# Patient Record
Sex: Male | Born: 1950 | Race: White | Hispanic: No | Marital: Married | Smoking: Current some day smoker
Health system: Southern US, Community
[De-identification: ages and names within clinical notes are randomized; demographics above are authoritative.]

## PROBLEM LIST (undated history)

## (undated) DIAGNOSIS — R42 Dizziness and giddiness: Secondary | ICD-10-CM

## (undated) DIAGNOSIS — Z87442 Personal history of urinary calculi: Secondary | ICD-10-CM

## (undated) DIAGNOSIS — I771 Stricture of artery: Secondary | ICD-10-CM

## (undated) DIAGNOSIS — K3189 Other diseases of stomach and duodenum: Secondary | ICD-10-CM

## (undated) DIAGNOSIS — K219 Gastro-esophageal reflux disease without esophagitis: Secondary | ICD-10-CM

## (undated) DIAGNOSIS — Z974 Presence of external hearing-aid: Secondary | ICD-10-CM

## (undated) DIAGNOSIS — G4730 Sleep apnea, unspecified: Secondary | ICD-10-CM

## (undated) DIAGNOSIS — I10 Essential (primary) hypertension: Secondary | ICD-10-CM

## (undated) DIAGNOSIS — I219 Acute myocardial infarction, unspecified: Secondary | ICD-10-CM

## (undated) DIAGNOSIS — R935 Abnormal findings on diagnostic imaging of other abdominal regions, including retroperitoneum: Secondary | ICD-10-CM

## (undated) DIAGNOSIS — M1990 Unspecified osteoarthritis, unspecified site: Secondary | ICD-10-CM

## (undated) DIAGNOSIS — I2510 Atherosclerotic heart disease of native coronary artery without angina pectoris: Secondary | ICD-10-CM

## (undated) DIAGNOSIS — E7800 Pure hypercholesterolemia, unspecified: Secondary | ICD-10-CM

## (undated) DIAGNOSIS — N200 Calculus of kidney: Secondary | ICD-10-CM

## (undated) DIAGNOSIS — E119 Type 2 diabetes mellitus without complications: Secondary | ICD-10-CM

## (undated) DIAGNOSIS — Z87891 Personal history of nicotine dependence: Secondary | ICD-10-CM

## (undated) DIAGNOSIS — D649 Anemia, unspecified: Secondary | ICD-10-CM

## (undated) DIAGNOSIS — C801 Malignant (primary) neoplasm, unspecified: Secondary | ICD-10-CM

## (undated) DIAGNOSIS — J189 Pneumonia, unspecified organism: Secondary | ICD-10-CM

## (undated) DIAGNOSIS — Z972 Presence of dental prosthetic device (complete) (partial): Secondary | ICD-10-CM

## (undated) HISTORY — DX: Personal history of nicotine dependence: Z87.891

## (undated) HISTORY — PX: KNEE SURGERY: SHX244

## (undated) HISTORY — PX: PARTIAL GASTRECTOMY: SHX2172

## (undated) HISTORY — DX: Calculus of kidney: N20.0

## (undated) HISTORY — DX: Malignant (primary) neoplasm, unspecified: C80.1

## (undated) HISTORY — PX: CARPAL TUNNEL RELEASE: SHX101

## (undated) HISTORY — PX: BACK SURGERY: SHX140

---
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
## 2002-07-31 DIAGNOSIS — I219 Acute myocardial infarction, unspecified: Secondary | ICD-10-CM

## 2002-07-31 HISTORY — PX: CARDIAC CATHETERIZATION: SHX172

## 2002-07-31 HISTORY — DX: Acute myocardial infarction, unspecified: I21.9

## 2005-11-06 ENCOUNTER — Emergency Department: Payer: Self-pay | Admitting: Emergency Medicine

## 2010-06-15 ENCOUNTER — Ambulatory Visit: Payer: Self-pay | Admitting: Family Medicine

## 2010-07-11 ENCOUNTER — Ambulatory Visit: Payer: Self-pay | Admitting: Family Medicine

## 2012-02-17 ENCOUNTER — Emergency Department: Payer: Self-pay | Admitting: Internal Medicine

## 2014-01-01 ENCOUNTER — Emergency Department: Payer: Self-pay | Admitting: Emergency Medicine

## 2014-01-01 LAB — PROTIME-INR
INR: 1
Prothrombin Time: 13.2 secs (ref 11.5–14.7)

## 2014-01-01 LAB — CBC
HCT: 43.9 % (ref 40.0–52.0)
HGB: 14.6 g/dL (ref 13.0–18.0)
MCH: 32 pg (ref 26.0–34.0)
MCHC: 33.2 g/dL (ref 32.0–36.0)
MCV: 96 fL (ref 80–100)
Platelet: 147 10*3/uL — ABNORMAL LOW (ref 150–440)
RBC: 4.55 10*6/uL (ref 4.40–5.90)
RDW: 13.2 % (ref 11.5–14.5)
WBC: 15 10*3/uL — ABNORMAL HIGH (ref 3.8–10.6)

## 2014-01-03 ENCOUNTER — Emergency Department: Payer: Self-pay | Admitting: Internal Medicine

## 2014-01-06 ENCOUNTER — Emergency Department: Payer: Self-pay | Admitting: Emergency Medicine

## 2014-03-04 DIAGNOSIS — I214 Non-ST elevation (NSTEMI) myocardial infarction: Secondary | ICD-10-CM | POA: Insufficient documentation

## 2014-03-04 HISTORY — DX: Non-ST elevation (NSTEMI) myocardial infarction: I21.4

## 2014-07-31 DIAGNOSIS — K3189 Other diseases of stomach and duodenum: Secondary | ICD-10-CM

## 2014-07-31 HISTORY — DX: Other diseases of stomach and duodenum: K31.89

## 2014-11-29 ENCOUNTER — Emergency Department
Admission: EM | Admit: 2014-11-29 | Discharge: 2014-11-30 | Disposition: A | Discharge status: Home / Self Care | Payer: Commercial Managed Care - PPO | Attending: Emergency Medicine | Admitting: Emergency Medicine

## 2014-11-29 ENCOUNTER — Emergency Department: Payer: Commercial Managed Care - PPO

## 2014-11-29 ENCOUNTER — Other Ambulatory Visit: Payer: Self-pay

## 2014-11-29 ENCOUNTER — Encounter: Payer: Self-pay | Admitting: Emergency Medicine

## 2014-11-29 DIAGNOSIS — Y998 Other external cause status: Secondary | ICD-10-CM | POA: Insufficient documentation

## 2014-11-29 DIAGNOSIS — Z720 Tobacco use: Secondary | ICD-10-CM | POA: Diagnosis not present

## 2014-11-29 DIAGNOSIS — Z8701 Personal history of pneumonia (recurrent): Secondary | ICD-10-CM | POA: Diagnosis not present

## 2014-11-29 DIAGNOSIS — E119 Type 2 diabetes mellitus without complications: Secondary | ICD-10-CM | POA: Insufficient documentation

## 2014-11-29 DIAGNOSIS — Z7982 Long term (current) use of aspirin: Secondary | ICD-10-CM | POA: Insufficient documentation

## 2014-11-29 DIAGNOSIS — Z79899 Other long term (current) drug therapy: Secondary | ICD-10-CM | POA: Insufficient documentation

## 2014-11-29 DIAGNOSIS — Y9289 Other specified places as the place of occurrence of the external cause: Secondary | ICD-10-CM | POA: Insufficient documentation

## 2014-11-29 DIAGNOSIS — S76219A Strain of adductor muscle, fascia and tendon of unspecified thigh, initial encounter: Secondary | ICD-10-CM | POA: Insufficient documentation

## 2014-11-29 DIAGNOSIS — S3991XA Unspecified injury of abdomen, initial encounter: Secondary | ICD-10-CM | POA: Diagnosis present

## 2014-11-29 DIAGNOSIS — Y9389 Activity, other specified: Secondary | ICD-10-CM | POA: Insufficient documentation

## 2014-11-29 DIAGNOSIS — X58XXXA Exposure to other specified factors, initial encounter: Secondary | ICD-10-CM | POA: Diagnosis not present

## 2014-11-29 DIAGNOSIS — R0600 Dyspnea, unspecified: Secondary | ICD-10-CM | POA: Diagnosis not present

## 2014-11-29 DIAGNOSIS — S39011A Strain of muscle, fascia and tendon of abdomen, initial encounter: Secondary | ICD-10-CM

## 2014-11-29 DIAGNOSIS — Z7902 Long term (current) use of antithrombotics/antiplatelets: Secondary | ICD-10-CM | POA: Diagnosis not present

## 2014-11-29 HISTORY — DX: Pneumonia, unspecified organism: J18.9

## 2014-11-29 HISTORY — DX: Type 2 diabetes mellitus without complications: E11.9

## 2014-11-29 LAB — COMPREHENSIVE METABOLIC PANEL
ALT: 48 U/L (ref 17–63)
AST: 44 U/L — ABNORMAL HIGH (ref 15–41)
Albumin: 4 g/dL (ref 3.5–5.0)
Alkaline Phosphatase: 61 U/L (ref 38–126)
Anion gap: 9 (ref 5–15)
BUN: 20 mg/dL (ref 6–20)
CO2: 27 mmol/L (ref 22–32)
Calcium: 9.6 mg/dL (ref 8.9–10.3)
Chloride: 105 mmol/L (ref 101–111)
Creatinine, Ser: 1.02 mg/dL (ref 0.61–1.24)
GFR calc Af Amer: >60 mL/min (ref >60)
GFR calc non Af Amer: >60 mL/min (ref >60)
Glucose, Bld: 142 mg/dL — ABNORMAL HIGH (ref 65–99)
Potassium: 3.9 mmol/L (ref 3.5–5.1)
Sodium: 141 mmol/L (ref 135–145)
Total Bilirubin: 0.6 mg/dL (ref 0.3–1.2)
Total Protein: 7.6 g/dL (ref 6.5–8.1)

## 2014-11-29 LAB — URINALYSIS COMPLETE WITH MICROSCOPIC (ARMC ONLY)
Bilirubin Urine: NEGATIVE
Glucose, UA: NEGATIVE mg/dL
Hgb urine dipstick: NEGATIVE
Ketones, ur: NEGATIVE mg/dL
Leukocytes, UA: NEGATIVE
Nitrite: NEGATIVE
Protein, ur: NEGATIVE mg/dL
Specific Gravity, Urine: 1.023 (ref 1.005–1.030)
pH: 7 (ref 5.0–8.0)

## 2014-11-29 LAB — CBC WITH DIFFERENTIAL/PLATELET
Basophils Absolute: 0.1 10*3/uL (ref 0–0.1)
Basophils Relative: 1 %
Eosinophils Absolute: 0.4 10*3/uL (ref 0–0.7)
Eosinophils Relative: 4 %
HCT: 43.3 % (ref 40.0–52.0)
Hemoglobin: 14.7 g/dL (ref 13.0–18.0)
Lymphocytes Relative: 30 %
Lymphs Abs: 3 10*3/uL (ref 1.0–3.6)
MCH: 32.5 pg (ref 26.0–34.0)
MCHC: 33.9 g/dL (ref 32.0–36.0)
MCV: 95.9 fL (ref 80.0–100.0)
Monocytes Absolute: 0.7 10*3/uL (ref 0.2–1.0)
Monocytes Relative: 7 %
Neutro Abs: 6.1 10*3/uL (ref 1.4–6.5)
Neutrophils Relative %: 58 %
Platelets: 137 10*3/uL — ABNORMAL LOW (ref 150–440)
RBC: 4.52 MIL/uL (ref 4.40–5.90)
RDW: 13.8 % (ref 11.5–14.5)
WBC: 10.3 10*3/uL (ref 3.8–10.6)

## 2014-11-29 LAB — TROPONIN I
Troponin I: <0.03 ng/mL (ref <0.031)
Troponin I: <0.03 ng/mL (ref <0.031)

## 2014-11-29 LAB — LIPASE, BLOOD: Lipase: 46 U/L (ref 22–51)

## 2014-11-29 NOTE — ED Provider Notes (Signed)
[facility] Emergency Department Provider Note    ____________________________________________  Time seen: 9:45 PM  I have reviewed the triage vital signs and the nursing notes.   HISTORY  Chief Complaint Abdominal Pain       HPI Kevin Ryan is a 64 y.o. male history of hypertension and CAD who presents with several weeks of lower abdominal cramping. He says that it is associated with movement and heavy lifting. He became concerned today because while lifting he broke out in a sweat. He said he also had "indigestion last night."  His concern was that he was having a heart attack. He says he has had a stent in the past and is compliant with his aspirin and Plavix. He denies any chest pain at this time or with the event this afternoon. He is also concerned because he said that he feels like he is not having complete voids. However, he is not waking up in the middle the night to void and does not have any burning on urination. He does not any history of urinary tract infection. His lower abdominal pain is described as aching and is mild to moderate.He says he has not had a stress test in 2 years. However, he is compliant with his follow-up to Dr. Saralyn Pilar.  He does not have a primary care doctor and has not had prostate or colon cancer screening. He does a history of liver cancer in his family which killed his father.   Past Medical History  Diagnosis Date  . Diabetes mellitus without complication   . Myocardial infarct   . Pneumonia     There are no active problems to display for this patient.   History reviewed. No pertinent past surgical history.  Current Outpatient Rx  Name  Route  Sig  Dispense  Refill  . aspirin 81 MG tablet   Oral   Take 81 mg by mouth daily.         . Choline Fenofibrate (FENOFIBRIC ACID) 135 MG CPDR   Oral   Take 135 mg by mouth at bedtime.         . clopidogrel (PLAVIX) 75 MG tablet   Oral   Take 75 mg by mouth  daily.         Marland Kitchen glipiZIDE (GLUCOTROL XL) 5 MG 24 hr tablet   Oral   Take 5 mg by mouth daily with breakfast.         . lisinopril (PRINIVIL,ZESTRIL) 2.5 MG tablet   Oral   Take 2.5 mg by mouth daily.         . metFORMIN (GLUCOPHAGE) 500 MG tablet   Oral   Take 2,000 mg by mouth daily.         . metoprolol (LOPRESSOR) 50 MG tablet   Oral   Take 50 mg by mouth 2 (two) times daily.         . Omega-3 Fatty Acids (FISH OIL) 1000 MG CAPS   Oral   Take 1,000 mg by mouth daily.         . simvastatin (ZOCOR) 80 MG tablet   Oral   Take 80 mg by mouth daily.         . Vitamin D, Ergocalciferol, (DRISDOL) 50000 UNITS CAPS capsule   Oral   Take 50,000 Units by mouth every 7 (seven) days.           Allergies Tylenol  History reviewed. No pertinent family history.  Social History History  Substance Use Topics  . Smoking status: Current Every Day Smoker  . Smokeless tobacco: Never Used  . Alcohol Use: No    Review of Systems  Constitutional: Negative for fever. Eyes: Negative for visual changes. ENT: Negative for sore throat. Cardiovascular: Negative for chest pain. Respiratory: Negative for shortness of breath. Gastrointestinal: Lower abdominal pain. Genitourinary: Negative for dysuria. Musculoskeletal: Negative for back pain. Skin: Negative for rash. Neurological: Negative for headaches, focal weakness or numbness.   10-point ROS otherwise negative.  ____________________________________________   PHYSICAL EXAM:  VITAL SIGNS: ED Triage Vitals  Enc Vitals Group     BP 11/29/14 1811 140/64 mmHg     Pulse Rate 11/29/14 1811 73     Resp 11/29/14 1811 22     Temp 11/29/14 1811 98.5 F (36.9 C)     Temp Source 11/29/14 1811 Oral     SpO2 11/29/14 1811 97 %     Weight 11/29/14 1811 260 lb (117.935 kg)     Height 11/29/14 1811 6\' 2"  (1.88 m)     Head Cir --      Peak Flow --      Pain Score 11/29/14 1812 7     Pain Loc --      Pain Edu? --       Excl. in [place]? --      Constitutional: Alert and oriented. Well appearing and in no distress. Eyes: Conjunctivae are normal. PERRL. Normal extraocular movements. ENT   Head: Normocephalic and atraumatic.   Nose: No congestion/rhinnorhea.   Mouth/Throat: Mucous membranes are moist.   Neck: No stridor. Hematological/Lymphatic/Immunilogical: No cervical lymphadenopathy. Cardiovascular: Normal rate, regular rhythm. Normal and symmetric distal pulses are present in all extremities. No murmurs, rubs, or gallops. Respiratory: Normal respiratory effort without tachypnea nor retractions. Breath sounds are clear and equal bilaterally. No wheezes/rales/rhonchi. Gastrointestinal: Soft and nontender. No distention. No abdominal bruits. There is no CVA tenderness. Genitourinary: Uncircumcised. Normal inspection. No testicular pain.  Musculoskeletal: Nontender with normal range of motion in all extremities. No joint effusions.  No lower extremity tenderness nor edema. Mild tenderness to palpation over the pubic symphysis. There is no deformity and the pelvis is stable. Neurologic:  Normal speech and language. No gross focal neurologic deficits are appreciated. Speech is normal. No gait instability. Skin:  Skin is warm, dry and intact. No rash noted. Psychiatric: Mood and affect are normal. Speech and behavior are normal. Patient exhibits appropriate insight and judgment.  ____________________________________________   EKG   Date: 11/29/2014  Rate: 73  Rhythm: normal sinus rhythm  QRS Axis: normal  Intervals: normal  ST/T Wave abnormalities: normal  Conduction Disutrbances: none  Narrative Interpretation: Cannot rule out anterior infarct, age undetermined      ____________________________________________    RADIOLOGY  Deferred. No chest pain or upper abdominal pain.  ____________________________________________   PROCEDURES  Bedside bladder scan done after voiding  with 18 cc of urine.  ____________________________________________   INITIAL IMPRESSION / ASSESSMENT AND PLAN / ED COURSE  Pertinent labs & imaging results that were available during my care of the patient were reviewed by me and considered in my medical decision making (see chart for details).  ----------------------------------------- 12:08 AM on 11/30/2014 -----------------------------------------  Patient reassessed and is resting comfortably in bed. Patient is pain-free. 2 troponins both undetectable. Patient will be discharged home. Likely groin strain. Patient counseled to reduce heavy lifting. Also counseled to follow up with primary care doctor. Patient has become behind on his prostate and colon  cancer screening. It a brother who died of throat cancer in father who died of possibly liver cancer. Patient will follow up at [place] care reasons in the past. Of note does not note any recent unexpected weight loss.  ____________________________________________   FINAL CLINICAL IMPRESSION(S) / ED DIAGNOSES  Acute groin strain, initial visit   Doran Stabler, MD 11/30/14 0009

## 2014-11-29 NOTE — ED Notes (Signed)
Patient to ED with c/o abdominal pain x today reports pain is in lower abdomen and down into groin area. Patient's wife reports sweats and nausea as well.

## 2015-03-09 ENCOUNTER — Emergency Department: Payer: Commercial Managed Care - PPO

## 2015-03-09 ENCOUNTER — Emergency Department
Admission: EM | Admit: 2015-03-09 | Discharge: 2015-03-09 | Disposition: A | Discharge status: Home / Self Care | Payer: Commercial Managed Care - PPO | Attending: Emergency Medicine | Admitting: Emergency Medicine

## 2015-03-09 ENCOUNTER — Encounter: Payer: Self-pay | Admitting: Emergency Medicine

## 2015-03-09 DIAGNOSIS — E119 Type 2 diabetes mellitus without complications: Secondary | ICD-10-CM | POA: Insufficient documentation

## 2015-03-09 DIAGNOSIS — Z79899 Other long term (current) drug therapy: Secondary | ICD-10-CM | POA: Insufficient documentation

## 2015-03-09 DIAGNOSIS — Z7901 Long term (current) use of anticoagulants: Secondary | ICD-10-CM | POA: Insufficient documentation

## 2015-03-09 DIAGNOSIS — Z7982 Long term (current) use of aspirin: Secondary | ICD-10-CM | POA: Insufficient documentation

## 2015-03-09 DIAGNOSIS — M5116 Intervertebral disc disorders with radiculopathy, lumbar region: Secondary | ICD-10-CM | POA: Insufficient documentation

## 2015-03-09 DIAGNOSIS — R222 Localized swelling, mass and lump, trunk: Secondary | ICD-10-CM | POA: Insufficient documentation

## 2015-03-09 DIAGNOSIS — R109 Unspecified abdominal pain: Secondary | ICD-10-CM | POA: Diagnosis present

## 2015-03-09 DIAGNOSIS — Z720 Tobacco use: Secondary | ICD-10-CM | POA: Diagnosis not present

## 2015-03-09 DIAGNOSIS — K3189 Other diseases of stomach and duodenum: Secondary | ICD-10-CM

## 2015-03-09 DIAGNOSIS — M5136 Other intervertebral disc degeneration, lumbar region: Secondary | ICD-10-CM

## 2015-03-09 LAB — CBC WITH DIFFERENTIAL/PLATELET
Basophils Absolute: 0 10*3/uL (ref 0–0.1)
Basophils Relative: 0 %
Eosinophils Absolute: 0.2 10*3/uL (ref 0–0.7)
Eosinophils Relative: 1 %
HCT: 41.2 % (ref 40.0–52.0)
Hemoglobin: 13.8 g/dL (ref 13.0–18.0)
Lymphocytes Relative: 7 %
Lymphs Abs: 1 10*3/uL (ref 1.0–3.6)
MCH: 32.6 pg (ref 26.0–34.0)
MCHC: 33.5 g/dL (ref 32.0–36.0)
MCV: 97.4 fL (ref 80.0–100.0)
Monocytes Absolute: 0.7 10*3/uL (ref 0.2–1.0)
Monocytes Relative: 5 %
Neutro Abs: 13 10*3/uL — ABNORMAL HIGH (ref 1.4–6.5)
Neutrophils Relative %: 87 %
Platelets: 116 10*3/uL — ABNORMAL LOW (ref 150–440)
RBC: 4.23 MIL/uL — ABNORMAL LOW (ref 4.40–5.90)
RDW: 13.6 % (ref 11.5–14.5)
WBC: 14.9 10*3/uL — ABNORMAL HIGH (ref 3.8–10.6)

## 2015-03-09 LAB — COMPREHENSIVE METABOLIC PANEL
ALT: 55 U/L (ref 17–63)
AST: 57 U/L — ABNORMAL HIGH (ref 15–41)
Albumin: 4 g/dL (ref 3.5–5.0)
Alkaline Phosphatase: 62 U/L (ref 38–126)
Anion gap: 8 (ref 5–15)
BUN: 20 mg/dL (ref 6–20)
CO2: 27 mmol/L (ref 22–32)
Calcium: 9 mg/dL (ref 8.9–10.3)
Chloride: 103 mmol/L (ref 101–111)
Creatinine, Ser: 1.11 mg/dL (ref 0.61–1.24)
GFR calc Af Amer: >60 mL/min (ref >60)
GFR calc non Af Amer: >60 mL/min (ref >60)
Glucose, Bld: 193 mg/dL — ABNORMAL HIGH (ref 65–99)
Potassium: 4 mmol/L (ref 3.5–5.1)
Sodium: 138 mmol/L (ref 135–145)
Total Bilirubin: 0.7 mg/dL (ref 0.3–1.2)
Total Protein: 7 g/dL (ref 6.5–8.1)

## 2015-03-09 LAB — URINALYSIS COMPLETE WITH MICROSCOPIC (ARMC ONLY)
Bacteria, UA: NONE SEEN
Bilirubin Urine: NEGATIVE
Glucose, UA: 50 mg/dL — AB
Hgb urine dipstick: NEGATIVE
Ketones, ur: NEGATIVE mg/dL
Leukocytes, UA: NEGATIVE
Nitrite: NEGATIVE
Protein, ur: NEGATIVE mg/dL
Specific Gravity, Urine: 1.021 (ref 1.005–1.030)
pH: 5 (ref 5.0–8.0)

## 2015-03-09 MED ORDER — OXYCODONE HCL 5 MG PO TABS
5.0000 mg | ORAL_TABLET | Freq: Three times a day (TID) | ORAL | Status: AC | PRN
Start: 2015-03-09 — End: 2016-03-08

## 2015-03-09 MED ORDER — HYDROMORPHONE HCL 1 MG/ML IJ SOLN
1.0000 mg | Freq: Once | INTRAMUSCULAR | Status: AC
  Administered 2015-03-09: 1 mg via INTRAVENOUS
  Filled 2015-03-09: qty 1

## 2015-03-09 MED ORDER — SODIUM CHLORIDE 0.9 % IV BOLUS (SEPSIS)
1000.0000 mL | Freq: Once | INTRAVENOUS | Status: AC
  Administered 2015-03-09: 1000 mL via INTRAVENOUS

## 2015-03-09 MED ORDER — ONDANSETRON HCL 4 MG/2ML IJ SOLN
4.0000 mg | Freq: Once | INTRAMUSCULAR | Status: AC
  Administered 2015-03-09: 4 mg via INTRAVENOUS
  Filled 2015-03-09: qty 2

## 2015-03-09 NOTE — Discharge Instructions (Signed)
Call today to schedule an appointment to see the gastroenterologist. Follow up with orthopedics for back pain. Continue the Naprosyn.

## 2015-03-09 NOTE — ED Provider Notes (Signed)
[facility] Emergency Department Provider Note  ____________________________________________  Time seen: Approximately 9:13 AM  I have reviewed the triage vital signs and the nursing notes.   HISTORY  Chief Complaint Flank Pain   HPI RACE Kevin Ryan is a 64 y.o. male presents to the emergency department for acute onset of right flank pain that has worsened through the morning. He did do quite a bit of yard work yesterday and had to crank the tiller. Otherwise he denies possibility of injury. He has had sciatica in the past but this feels different. He has also had kidney stone in the past but this feels different as well.   Past Medical History  Diagnosis Date  . Diabetes mellitus without complication   . Myocardial infarct   . Pneumonia     There are no active problems to display for this patient.   Past Surgical History  Procedure Laterality Date  . Knee surgery      Current Outpatient Rx  Name  Route  Sig  Dispense  Refill  . aspirin 81 MG tablet   Oral   Take 81 mg by mouth daily.         . Choline Fenofibrate (FENOFIBRIC ACID) 135 MG CPDR   Oral   Take 135 mg by mouth at bedtime.         . clopidogrel (PLAVIX) 75 MG tablet   Oral   Take 75 mg by mouth daily.         Marland Kitchen glipiZIDE (GLUCOTROL XL) 5 MG 24 hr tablet   Oral   Take 5 mg by mouth daily with breakfast.         . lisinopril (PRINIVIL,ZESTRIL) 2.5 MG tablet   Oral   Take 2.5 mg by mouth daily.         . metFORMIN (GLUCOPHAGE) 500 MG tablet   Oral   Take 2,000 mg by mouth daily.         . metoprolol (LOPRESSOR) 50 MG tablet   Oral   Take 50 mg by mouth 2 (two) times daily.         . Omega-3 Fatty Acids (FISH OIL) 1000 MG CAPS   Oral   Take 1,000 mg by mouth daily.         Marland Kitchen oxyCODONE (ROXICODONE) 5 MG immediate release tablet   Oral   Take 1 tablet (5 mg total) by mouth every 8 (eight) hours as needed.   20 tablet   0   . simvastatin (ZOCOR) 80 MG  tablet   Oral   Take 80 mg by mouth daily.         . Vitamin D, Ergocalciferol, (DRISDOL) 50000 UNITS CAPS capsule   Oral   Take 50,000 Units by mouth every 7 (seven) days.           Allergies Ibuprofen and Tylenol  No family history on file.  Social History History  Substance Use Topics  . Smoking status: Current Every Day Smoker  . Smokeless tobacco: Never Used  . Alcohol Use: No    Review of Systems Constitutional: No recent illness. Eyes: No visual changes. ENT: No sore throat. Cardiovascular: Denies chest pain or palpitations. Respiratory: Denies shortness of breath. Gastrointestinal: No abdominal pain.  Genitourinary: Negative for dysuria. Musculoskeletal: Pain in right flank. Skin: Negative for rash. Neurological: Negative for headaches, focal weakness or numbness. 10-point ROS otherwise negative.  ____________________________________________   PHYSICAL EXAM:  VITAL SIGNS: ED Triage Vitals  Enc  Vitals Group     BP 03/09/15 0820 145/57 mmHg     Pulse Rate 03/09/15 0820 100     Resp --      Temp 03/09/15 0820 99.9 F (37.7 C)     Temp Source 03/09/15 0820 Oral     SpO2 03/09/15 0820 97 %     Weight 03/09/15 0820 250 lb (113.399 kg)     Height 03/09/15 0820 6\' 2"  (1.88 m)     Head Cir --      Peak Flow --      Pain Score 03/09/15 0832 8     Pain Loc --      Pain Edu? --      Excl. in [place]? --     Constitutional: Alert and oriented. Well appearing and in no acute distress. Eyes: Conjunctivae are normal. EOMI. Head: Atraumatic. Nose: No congestion/rhinnorhea. Neck: No stridor.  Respiratory: Normal respiratory effort.   Musculoskeletal: Tenderness in the right flank area Neurologic:  Normal speech and language. No gross focal neurologic deficits are appreciated. Speech is normal. No gait instability. Skin:  Skin is warm, dry and intact. Atraumatic. Psychiatric: Mood and affect are normal. Speech and behavior are  normal.  ____________________________________________   LABS (all labs ordered are listed, but only abnormal results are displayed)  Labs Reviewed  COMPREHENSIVE METABOLIC PANEL - Abnormal; Notable for the following:    Glucose, Bld 193 (*)    AST 57 (*)    All other components within normal limits  URINALYSIS COMPLETEWITH MICROSCOPIC ([facility] ONLY) - Abnormal; Notable for the following:    Color, Urine YELLOW (*)    APPearance CLEAR (*)    Glucose, UA 50 (*)    Squamous Epithelial / LPF 0-5 (*)    All other components within normal limits  CBC WITH DIFFERENTIAL/PLATELET - Abnormal; Notable for the following:    WBC 14.9 (*)    RBC 4.23 (*)    Platelets 116 (*)    Neutro Abs 13.0 (*)    All other components within normal limits   ____________________________________________  RADIOLOGY  No obstructing calculus, 2 cm circumscribed mass arising from the greater curvature of stomach possibly GI ST, fatty liver disease, advanced degenerative changes and lumbar spine with multilevel degenerative spinal stenosis.  Plain film of the lumbar spine shows degenerative disc disease. ____________________________________________   PROCEDURES  Procedure(s) performed: None   ____________________________________________   INITIAL IMPRESSION / ASSESSMENT AND PLAN / ED COURSE  Pertinent labs & imaging results that were available during my care of the patient were reviewed by me and considered in my medical decision making (see chart for details).  Patient and family were given the results of the CT scan. They're strongly encouraged to call today to schedule an appointment with the gastrointestinal doctor of their choice for further evaluation of the mass on the stomach. They were also advised to schedule an appointment with the spine specialist/orthopedic doctor to discuss further evaluation of the degenerative disc disease and spinal stenosis.  Pain was well controlled upon discharge. He  was advised to return to the emergency department for symptoms that change or worsen if he is unable schedule an appointment. ____________________________________________   FINAL CLINICAL IMPRESSION(S) / ED DIAGNOSES  Final diagnoses:  Acute right flank pain  Mass of stomach  Degenerative disc disease, lumbar       Victorino Dike, FNP 03/09/15 1452  Carrie Mew, MD 03/09/15 [phone]

## 2015-03-09 NOTE — ED Notes (Signed)
Pt to ed with c/o right flank pain that started this am. Worse with movement.  Pt denies difficulty with urination. Pt does report yesterday he was pulling heavily on a tiller.

## 2015-03-09 NOTE — ED Notes (Signed)
Back from ct scan  ..resting at present . Family remains at bedside

## 2015-03-09 NOTE — ED Notes (Signed)
States he developed right lower back/flank pain after pulling to start a tiller.. Pain increases with movement. Unable to bear full wt d/t wt

## 2015-03-25 ENCOUNTER — Encounter: Payer: Self-pay | Admitting: *Deleted

## 2015-03-26 ENCOUNTER — Encounter
Admission: RE | Disposition: A | Discharge status: Home / Self Care | Payer: Self-pay | Source: Ambulatory Visit | Attending: Gastroenterology

## 2015-03-26 ENCOUNTER — Ambulatory Visit: Payer: Commercial Managed Care - PPO | Admitting: Anesthesiology

## 2015-03-26 ENCOUNTER — Encounter: Payer: Self-pay | Admitting: *Deleted

## 2015-03-26 ENCOUNTER — Ambulatory Visit
Admission: RE | Admit: 2015-03-26 | Discharge: 2015-03-26 | Disposition: A | Discharge status: Home / Self Care | Payer: Commercial Managed Care - PPO | Source: Ambulatory Visit | Attending: Gastroenterology | Admitting: Gastroenterology

## 2015-03-26 DIAGNOSIS — D120 Benign neoplasm of cecum: Secondary | ICD-10-CM | POA: Diagnosis not present

## 2015-03-26 DIAGNOSIS — Z79899 Other long term (current) drug therapy: Secondary | ICD-10-CM | POA: Diagnosis not present

## 2015-03-26 DIAGNOSIS — E119 Type 2 diabetes mellitus without complications: Secondary | ICD-10-CM | POA: Diagnosis not present

## 2015-03-26 DIAGNOSIS — I10 Essential (primary) hypertension: Secondary | ICD-10-CM | POA: Diagnosis not present

## 2015-03-26 DIAGNOSIS — Z886 Allergy status to analgesic agent status: Secondary | ICD-10-CM | POA: Insufficient documentation

## 2015-03-26 DIAGNOSIS — E780 Pure hypercholesterolemia: Secondary | ICD-10-CM | POA: Diagnosis not present

## 2015-03-26 DIAGNOSIS — Z7982 Long term (current) use of aspirin: Secondary | ICD-10-CM | POA: Diagnosis not present

## 2015-03-26 DIAGNOSIS — K210 Gastro-esophageal reflux disease with esophagitis: Secondary | ICD-10-CM | POA: Insufficient documentation

## 2015-03-26 DIAGNOSIS — F17200 Nicotine dependence, unspecified, uncomplicated: Secondary | ICD-10-CM | POA: Insufficient documentation

## 2015-03-26 DIAGNOSIS — D123 Benign neoplasm of transverse colon: Secondary | ICD-10-CM | POA: Insufficient documentation

## 2015-03-26 DIAGNOSIS — Z1211 Encounter for screening for malignant neoplasm of colon: Secondary | ICD-10-CM | POA: Insufficient documentation

## 2015-03-26 DIAGNOSIS — I708 Atherosclerosis of other arteries: Secondary | ICD-10-CM | POA: Diagnosis not present

## 2015-03-26 DIAGNOSIS — K621 Rectal polyp: Secondary | ICD-10-CM | POA: Diagnosis not present

## 2015-03-26 DIAGNOSIS — I252 Old myocardial infarction: Secondary | ICD-10-CM | POA: Diagnosis not present

## 2015-03-26 HISTORY — DX: Other diseases of stomach and duodenum: K31.89

## 2015-03-26 HISTORY — DX: Essential (primary) hypertension: I10

## 2015-03-26 HISTORY — DX: Abnormal findings on diagnostic imaging of other abdominal regions, including retroperitoneum: R93.5

## 2015-03-26 HISTORY — PX: COLONOSCOPY WITH PROPOFOL: SHX5780

## 2015-03-26 HISTORY — DX: Stricture of artery: I77.1

## 2015-03-26 HISTORY — DX: Pure hypercholesterolemia, unspecified: E78.00

## 2015-03-26 LAB — GLUCOSE, CAPILLARY: Glucose-Capillary: 138 mg/dL — ABNORMAL HIGH (ref 65–99)

## 2015-03-26 SURGERY — COLONOSCOPY WITH PROPOFOL
Anesthesia: General

## 2015-03-26 MED ORDER — FENTANYL CITRATE (PF) 100 MCG/2ML IJ SOLN
INTRAMUSCULAR | Status: DC | PRN
  Administered 2015-03-26: 50 ug via INTRAVENOUS

## 2015-03-26 MED ORDER — PROPOFOL INFUSION 10 MG/ML OPTIME
INTRAVENOUS | Status: DC | PRN
Start: 2015-03-26 — End: 2015-03-26
  Administered 2015-03-26: 140 ug/kg/min via INTRAVENOUS

## 2015-03-26 MED ORDER — SODIUM CHLORIDE 0.9 % IV SOLN
INTRAVENOUS | Status: DC
  Administered 2015-03-26: 10:00:00 via INTRAVENOUS

## 2015-03-26 MED ORDER — LIDOCAINE HCL (CARDIAC) 20 MG/ML IV SOLN
INTRAVENOUS | Status: DC | PRN
  Administered 2015-03-26: 40 mg via INTRAVENOUS

## 2015-03-26 MED ORDER — PROPOFOL 10 MG/ML IV BOLUS
INTRAVENOUS | Status: DC | PRN
  Administered 2015-03-26: 50 mg via INTRAVENOUS

## 2015-03-26 MED ORDER — GLYCOPYRROLATE 0.2 MG/ML IJ SOLN
INTRAMUSCULAR | Status: DC | PRN
  Administered 2015-03-26: 0.2 mg via INTRAVENOUS

## 2015-03-26 MED ORDER — METOPROLOL TARTRATE 50 MG PO TABS
ORAL_TABLET | ORAL | Status: AC
  Administered 2015-03-26: 50 mg via ORAL
  Filled 2015-03-26: qty 1

## 2015-03-26 MED ORDER — METOPROLOL TARTRATE 50 MG PO TABS
50.0000 mg | ORAL_TABLET | Freq: Once | ORAL | Status: AC
  Administered 2015-03-26: 50 mg via ORAL

## 2015-03-26 NOTE — Discharge Instructions (Signed)

## 2015-03-26 NOTE — Anesthesia Preprocedure Evaluation (Signed)
Anesthesia Evaluation  Patient identified by MRN, date of birth, ID band Patient awake    Reviewed: Allergy & Precautions, NPO status , Patient's Chart, lab work & pertinent test results  History of Anesthesia Complications Negative for: history of anesthetic complications  Airway Mallampati: II  TM Distance: >3 FB     Dental  (+) Partial Upper   Pulmonary Current Smoker (1 ppd),          Cardiovascular hypertension, Pt. on medications and Pt. on home beta blockers + Past MI and + Cardiac Stents     Neuro/Psych    GI/Hepatic   Endo/Other  diabetes, Type 2, Oral Hypoglycemic Agents  Renal/GU      Musculoskeletal   Abdominal   Peds  Hematology   Anesthesia Other Findings   Reproductive/Obstetrics                             Anesthesia Physical Anesthesia Plan  ASA: III  Anesthesia Plan: General   Post-op Pain Management:    Induction: Intravenous  Airway Management Planned: Nasal Cannula  Additional Equipment:   Intra-op Plan:   Post-operative Plan:   Informed Consent: I have reviewed the patients History and Physical, chart, labs and discussed the procedure including the risks, benefits and alternatives for the proposed anesthesia with the patient or authorized representative who has indicated his/her understanding and acceptance.     Plan Discussed with:   Anesthesia Plan Comments:         Anesthesia Quick Evaluation

## 2015-03-26 NOTE — Op Note (Signed)
[facility] Gastroenterology Patient Name: Kevin Ryan Procedure Date: 03/26/2015 9:36 AM MRN: [id number] Account #: [id number] Date of Birth: 08-30-1950 Admit Type: Outpatient Age: 64 Room: [facility] ENDO ROOM 2 Gender: Male Note Status: Finalized Procedure:         Upper GI endoscopy Indications:       Abnormal CT of the GI tract Providers:         Gerrit Heck. Rayann Heman, MD Referring MD:      Daniel Nones, MD (Referring MD) Medicines:         Propofol per Anesthesia Complications:     No immediate complications. Procedure:         Pre-Anesthesia Assessment:                    - Prior to the procedure, a History and Physical was                     performed, and patient medications, allergies and                     sensitivities were reviewed. The patient's tolerance of                     previous anesthesia was reviewed.                    After obtaining informed consent, the endoscope was passed                     under direct vision. Throughout the procedure, the                     patient's blood pressure, pulse, and oxygen saturations                     were monitored continuously. The Endoscope was introduced                     through the mouth, and advanced to the second part of                     duodenum. The upper GI endoscopy was accomplished without                     difficulty. The patient tolerated the procedure well. Findings:      LA Grade B (one or more mucosal breaks greater than 5 mm, not extending       between the tops of two mucosal folds) esophagitis was found at the       gastroesophageal junction. Biopsies were taken with a cold forceps for       histology.      Extrinsic compression on the stomach was found on the greater curvature       of the stomach.      Localized nodular mucosa was found in the duodenal bulb. Biopsies were       taken with a cold forceps for histology.      The exam was otherwise without  abnormality. Impression:        - LA Grade B reflux esophagitis. Biopsied.                    - Possible very mild extrinsic compression in the greater  curvature of the stomach.                    - Nodular mucosa in the duodenal bulb. Biopsied.                    - The examination was otherwise normal. Recommendation:    - Observe patient in GI recovery unit.                    - Resume regular diet.                    - Continue present medications.                    - Await pathology results.                    - Perform an upper endoscopic ultrasound (UEUS) at                     appointment to be scheduled.                    - The findings and recommendations were discussed with the                     patient.                    - The findings and recommendations were discussed with the                     patient's family.                    - Perform a colonoscopy today. Procedure Code(s): --- Professional ---                    [phone], Esophagogastroduodenoscopy, flexible, transoral;                     with biopsy, single or multiple CPT copyright 2014 American Medical Association. All rights reserved. The codes documented in this report are preliminary and upon coder review may  be revised to meet current compliance requirements. Mellody Life, MD 03/26/2015 10:00:54 AM This report has been signed electronically. Number of Addenda: 0 Note Initiated On: 03/26/2015 9:36 AM      [facility]

## 2015-03-26 NOTE — Anesthesia Procedure Notes (Signed)
Date/Time: 03/26/2015 9:34 AM Performed by: Doreen Salvage Pre-anesthesia Checklist: Patient identified, Emergency Drugs available, Suction available and Patient being monitored Patient Re-evaluated:Patient Re-evaluated prior to inductionOxygen Delivery Method: Nasal cannula

## 2015-03-26 NOTE — Op Note (Signed)
[facility] Gastroenterology Patient Name: Kevin Ryan Procedure Date: 03/26/2015 9:28 AM MRN: [id number] Account #: [id number] Date of Birth: 1950/12/10 Admit Type: Outpatient Age: 64 Room: [facility] ENDO ROOM 2 Gender: Male Note Status: Finalized Procedure:         Colonoscopy Indications:       Screening for colorectal malignant neoplasm, This is the                     patient's first colonoscopy Patient Profile:   This is a 64 year old male. Providers:         Gerrit Heck. Rayann Heman, MD Referring MD:      Daniel Nones, MD (Referring MD) Medicines:         Propofol per Anesthesia Complications:     No immediate complications. Procedure:         Pre-Anesthesia Assessment:                    - Prior to the procedure, a History and Physical was                     performed, and patient medications, allergies and                     sensitivities were reviewed. The patient's tolerance of                     previous anesthesia was reviewed.                    After obtaining informed consent, the colonoscope was                     passed under direct vision. Throughout the procedure, the                     patient's blood pressure, pulse, and oxygen saturations                     were monitored continuously. The Colonoscope was                     introduced through the anus and advanced to the the cecum,                     identified by appendiceal orifice and ileocecal valve. The                     colonoscopy was performed without difficulty. The patient                     tolerated the procedure well. The quality of the bowel                     preparation was excellent. Findings:      The perianal and digital rectal examinations were normal.      A 2 mm polyp was found in the cecum. The polyp was sessile. The polyp       was removed with a jumbo cold forceps. Resection and retrieval were       complete.      A 3 mm polyp was found in the transverse colon. The  polyp was sessile.       The polyp was removed with a jumbo cold forceps. Resection and retrieval  were complete.      A 2 mm polyp was found in the rectum. The polyp was sessile. The polyp       was removed with a jumbo cold forceps. Resection and retrieval were       complete.      The exam was otherwise without abnormality on direct and retroflexion       views. Impression:        - One 2 mm polyp in the cecum. Resected and retrieved.                    - One 3 mm polyp in the transverse colon. Resected and                     retrieved.                    - One 2 mm polyp in the rectum. Resected and retrieved.                    - The examination was otherwise normal on direct and                     retroflexion views. Recommendation:    - Observe patient in GI recovery unit.                    - Continue present medications.                    - Await pathology results.                    - Repeat colonoscopy for surveillance based on pathology                     results.                    - Return to referring physician.                    - The findings and recommendations were discussed with the                     patient.                    - The findings and recommendations were discussed with the                     patient's family. Procedure Code(s): --- Professional ---                    [phone], Colonoscopy, flexible; with biopsy, single or                     multiple CPT copyright 2014 American Medical Association. All rights reserved. The codes documented in this report are preliminary and upon coder review may  be revised to meet current compliance requirements. Mellody Life, MD 03/26/2015 10:24:10 AM This report has been signed electronically. Number of Addenda: 0 Note Initiated On: 03/26/2015 9:28 AM Scope Withdrawal Time: 0 hours 12 minutes 52 seconds  Total Procedure Duration: 0 hours 18 minutes 22 seconds       [facility]

## 2015-03-26 NOTE — Anesthesia Postprocedure Evaluation (Signed)
  Anesthesia Post-op Note  Patient: Kevin Ryan  Procedure(s) Performed: Procedure(s): COLONOSCOPY WITH PROPOFOL (N/A)  Anesthesia type:General  Patient location: PACU  Post pain: Pain level controlled  Post assessment: Post-op Vital signs reviewed, Patient's Cardiovascular Status Stable, Respiratory Function Stable, Patent Airway and No signs of Nausea or vomiting  Post vital signs: Reviewed and stable  Last Vitals:  Filed Vitals:   03/26/15 1105  BP: 124/87  Pulse: 62  Temp:   Resp: 21    Level of consciousness: awake, alert  and patient cooperative  Complications: No apparent anesthesia complications

## 2015-03-26 NOTE — Transfer of Care (Signed)
Immediate Anesthesia Transfer of Care Note  Patient: Kevin Ryan  Procedure(s) Performed: Procedure(s): COLONOSCOPY WITH PROPOFOL (N/A)  Patient Location: PACU and Endoscopy Unit  Anesthesia Type:General  Level of Consciousness: sedated  Airway & Oxygen Therapy: Patient Spontanous Breathing and Patient connected to nasal cannula oxygen  Post-op Assessment: Report given to RN and Post -op Vital signs reviewed and stable  Post vital signs: Reviewed and stable  Last Vitals:  Filed Vitals:   03/26/15 1042  BP: 116/56  Pulse:   Temp: 36 C  Resp: 14    Complications: No apparent anesthesia complications

## 2015-03-26 NOTE — H&P (Signed)
Primary Care Physician:  Tomasita Morrow, MD  Pre-Procedure History & Physical: HPI:  Kevin Ryan is a 64 y.o. male is here for an colonoscopy / EGD   Past Medical History  Diagnosis Date  . Diabetes mellitus without complication   . Myocardial infarct   . Pneumonia   . Abnormal CT of the abdomen   . Gastric mass   . Stenosis of hepatic artery   . Hypertension   . Hypercholesterolemia     Past Surgical History  Procedure Laterality Date  . Knee surgery      Prior to Admission medications   Medication Sig Start Date End Date Taking? Authorizing Provider  aspirin 81 MG tablet Take 81 mg by mouth daily.   Yes Historical Provider, MD  Choline Fenofibrate (FENOFIBRIC ACID) 135 MG CPDR Take 135 mg by mouth at bedtime.   Yes Historical Provider, MD  glipiZIDE (GLUCOTROL XL) 5 MG 24 hr tablet Take 5 mg by mouth daily with breakfast.   Yes Historical Provider, MD  lisinopril (PRINIVIL,ZESTRIL) 2.5 MG tablet Take 2.5 mg by mouth daily.   Yes Historical Provider, MD  metFORMIN (GLUCOPHAGE) 500 MG tablet Take 2,000 mg by mouth daily.   Yes Historical Provider, MD  metoprolol (LOPRESSOR) 50 MG tablet Take 50 mg by mouth 2 (two) times daily.   Yes Historical Provider, MD  Omega-3 Fatty Acids (FISH OIL) 1000 MG CAPS Take 1,000 mg by mouth daily.   Yes Historical Provider, MD  oxyCODONE (ROXICODONE) 5 MG immediate release tablet Take 1 tablet (5 mg total) by mouth every 8 (eight) hours as needed. 03/09/15 03/08/16 Yes Cari B Triplett, FNP  simvastatin (ZOCOR) 80 MG tablet Take 80 mg by mouth daily.   Yes Historical Provider, MD  Vitamin D, Ergocalciferol, (DRISDOL) 50000 UNITS CAPS capsule Take 50,000 Units by mouth every 7 (seven) days.   Yes Historical Provider, MD  clopidogrel (PLAVIX) 75 MG tablet Take 75 mg by mouth daily.    Historical Provider, MD    Allergies as of 03/17/2015 - Review Complete 03/09/2015  Allergen Reaction Noted  . Ibuprofen Itching 03/09/2015  . Tylenol  [acetaminophen]  11/29/2014    History reviewed. No pertinent family history.  Social History   Social History  . Marital Status: Married    Spouse Name: N/A  . Number of Children: N/A  . Years of Education: N/A   Occupational History  . Not on file.   Social History Main Topics  . Smoking status: Current Every Day Smoker  . Smokeless tobacco: Never Used  . Alcohol Use: No  . Drug Use: No  . Sexual Activity: Not on file   Other Topics Concern  . Not on file   Social History Narrative     Physical Exam: BP 155/80 mmHg  Pulse 68  Temp(Src) 97.4 F (36.3 C)  Resp 16  Ht 6\' 2"  (1.88 m)  Wt 113.399 kg (250 lb)  BMI 32.08 kg/m2  SpO2 100% General:   Alert,  pleasant and cooperative in NAD Head:  Normocephalic and atraumatic. Neck:  Supple; no masses or thyromegaly. Lungs:  Clear throughout to auscultation.    Heart:  Regular rate and rhythm. Abdomen:  Soft, nontender and nondistended. Normal bowel sounds, without guarding, and without rebound.   Neurologic:  Alert and  oriented x4;  grossly normal neurologically.  Impression/Plan: Kevin Ryan is here for an colonoscopy to be performed for screening, EGD for gastric mass on CT  Risks, benefits, limitations, and alternatives  regarding  Colonoscopy / EGD have been reviewed with the patient.  Questions have been answered.  All parties agreeable.   Josefine Class, MD  03/26/2015, 9:24 AM

## 2015-03-29 ENCOUNTER — Telehealth: Payer: Self-pay

## 2015-03-29 NOTE — Telephone Encounter (Signed)
  Oncology Nurse Navigator Documentation  Referral date to RadOnc/MedOnc: 03/29/15 (03/29/15 1500) Navigator Encounter Type: Telephone (03/29/15 1500)         Interventions: Coordination of Care (03/29/15 1500)   Coordination of Care: EUS (03/29/15 1500)        Time Spent with Patient: 30 (03/29/15 1500)   Voicemail left to return call. EUS needs to be arranged for 04/08/15. Dr Josefa Half has been contacted to obtian cardiac clearance to hold plavix and asa.

## 2015-03-30 LAB — SURGICAL PATHOLOGY

## 2015-03-31 ENCOUNTER — Telehealth: Payer: Self-pay

## 2015-03-31 NOTE — Telephone Encounter (Signed)
  Oncology Nurse Navigator Documentation    Navigator Encounter Type: Telephone (03/31/15 1700)                      Time Spent with Patient: 15 (03/31/15 1700)   Asking if EUS has been scheduled at [facility] at this time yet. Communication resent and will follow up on

## 2015-05-07 DIAGNOSIS — C49A2 Gastrointestinal stromal tumor of stomach: Secondary | ICD-10-CM | POA: Insufficient documentation

## 2015-05-07 HISTORY — DX: Gastrointestinal stromal tumor of stomach: C49.A2

## 2015-07-13 ENCOUNTER — Encounter: Payer: Self-pay | Admitting: Family Medicine

## 2015-07-13 ENCOUNTER — Other Ambulatory Visit: Payer: Self-pay | Admitting: Family Medicine

## 2015-07-13 DIAGNOSIS — Z87891 Personal history of nicotine dependence: Secondary | ICD-10-CM | POA: Insufficient documentation

## 2015-07-13 HISTORY — DX: Personal history of nicotine dependence: Z87.891

## 2015-07-14 ENCOUNTER — Ambulatory Visit
Admission: RE | Admit: 2015-07-14 | Discharge: 2015-07-14 | Disposition: A | Discharge status: Home / Self Care | Payer: Commercial Managed Care - PPO | Source: Ambulatory Visit | Attending: Family Medicine | Admitting: Family Medicine

## 2015-07-14 ENCOUNTER — Encounter: Payer: Self-pay | Admitting: Family Medicine

## 2015-07-14 ENCOUNTER — Inpatient Hospital Stay: Payer: Commercial Managed Care - PPO | Attending: Family Medicine | Admitting: Family Medicine

## 2015-07-14 DIAGNOSIS — I2510 Atherosclerotic heart disease of native coronary artery without angina pectoris: Secondary | ICD-10-CM | POA: Diagnosis not present

## 2015-07-14 DIAGNOSIS — F17210 Nicotine dependence, cigarettes, uncomplicated: Secondary | ICD-10-CM | POA: Diagnosis not present

## 2015-07-14 DIAGNOSIS — Z122 Encounter for screening for malignant neoplasm of respiratory organs: Secondary | ICD-10-CM

## 2015-07-14 DIAGNOSIS — Z87891 Personal history of nicotine dependence: Secondary | ICD-10-CM | POA: Diagnosis not present

## 2015-07-14 NOTE — Progress Notes (Signed)
In accordance with CMS guidelines, patient has meet eligibility criteria including age, absence of signs or symptoms of lung cancer, the specific calculation of cigarette smoking pack-years was 30 years and is a current smoker.   A shared decision-making session was conducted prior to the performance of CT scan. This includes one or more decision aids, includes benefits and harms of screening, follow-up diagnostic testing, over-diagnosis, false positive rate, and total radiation exposure.  Counseling on the importance of adherence to annual lung cancer LDCT screening, impact of co-morbidities, and ability or willingness to undergo diagnosis and treatment is imperative for compliance of the program.  Counseling on the importance of continued smoking cessation for former smokers; the importance of smoking cessation for current smokers and information about tobacco cessation interventions have been given to patient including the [place] at [facility], 1800 quit [place], as well as Cancer Center specific smoking cessation programs.  Written order for lung cancer screening with LDCT has been given to the patient and any and all questions have been answered to the best of my abilities.   Yearly follow up will be scheduled by Shawn Perkins, Thoracic Navigator.   

## 2015-07-16 ENCOUNTER — Telehealth: Payer: Self-pay | Admitting: *Deleted

## 2015-07-16 NOTE — Telephone Encounter (Signed)
Notified patient of LDCT lung cancer screening results of Lung Rads 2 finding with recommendation for 12 month follow up imaging. Also notified of incidental finding noted in the body of the report. Patient verbalizes understanding.   IMPRESSION: Lung-RADS Category 2, benign appearance or behavior. Continue annual screening with low-dose chest CT without contrast in 12 months.  Coronary artery atherosclerosis

## 2016-07-03 ENCOUNTER — Telehealth: Payer: Self-pay | Admitting: *Deleted

## 2016-07-03 NOTE — Telephone Encounter (Signed)
Notified patient that annual lung cancer screening low dose CT scan is due. Patient is having imaging for follow up of GI surgery at a [facility] facility. He does not know if that is to include a chest scan and would like to wait 3 months to consider chest scan through this department.

## 2016-07-03 NOTE — Telephone Encounter (Signed)
Left voicemail for patient notifyng them that it is time to schedule annual low dose lung cancer screening CT scan. Instructed patient to call back to verify information prior to the scan being scheduled.  

## 2016-07-13 ENCOUNTER — Ambulatory Visit
Admission: EM | Admit: 2016-07-13 | Discharge: 2016-07-13 | Disposition: A | Discharge status: Home / Self Care | Payer: Commercial Managed Care - PPO | Attending: Family Medicine | Admitting: Family Medicine

## 2016-07-13 ENCOUNTER — Ambulatory Visit (INDEPENDENT_AMBULATORY_CARE_PROVIDER_SITE_OTHER): Payer: Commercial Managed Care - PPO

## 2016-07-13 DIAGNOSIS — S5001XA Contusion of right elbow, initial encounter: Secondary | ICD-10-CM

## 2016-07-13 DIAGNOSIS — M19021 Primary osteoarthritis, right elbow: Secondary | ICD-10-CM | POA: Diagnosis not present

## 2016-07-13 MED ORDER — KETOROLAC TROMETHAMINE 60 MG/2ML IM SOLN
60.0000 mg | Freq: Once | INTRAMUSCULAR | Status: AC
  Administered 2016-07-13: 60 mg via INTRAMUSCULAR

## 2016-07-13 MED ORDER — TRAMADOL HCL 50 MG PO TABS: 50.0000 mg | ORAL_TABLET | Freq: Four times a day (QID) | ORAL | 0 refills | Status: DC | PRN

## 2016-07-13 NOTE — ED Triage Notes (Signed)
Patient complains of right elbow pain. Patient states that he fell into a [employer] cougar on his elbow and then fell on to the concrete in his shop at his house 2 days ago. Patient has significant swelling in right elbow. Patient states that he cannot straighten elbow. Patient states that he can barely lift his arm.

## 2016-07-13 NOTE — ED Provider Notes (Signed)
[facility]    CSN: [id number] Arrival date & time: 07/13/16  1303     History   Chief Complaint Chief Complaint  Patient presents with  . Elbow Pain    right    HPI Kevin Ryan is a 65 y.o. male.   Patient fell on Tuesday. Apparently he fell elbow hit the hood of car he was working on and then he fell on the ground elbow been the point of impact. He reports increased pain in his elbow then this morning he woke up difficulty using elbow difficult moving elbow and increasing pain and swelling as well. He denies any loss of consciousness. He had a history of a stomach cancer that was removed recently he's had history diabetes and MI about a year ago hypertension he does use tobacco. He's had knee surgery colonoscopy and partial stomach removal family medical history is not pertinent to today's visit trauma.   The history is provided by the patient. No language interpreter was used.  Arm Injury  Location:  Elbow Elbow location:  R elbow Injury: yes   Time since incident:  2 days Mechanism of injury: fall   Fall:    Fall occurred: from a car hood.   Impact surface:  Hard floor   Point of impact: elbow. Pain details:    Quality:  Pressure and shooting   Radiates to:  Does not radiate   Severity:  Severe   Timing:  Constant   Progression:  Worsening Handedness:  Right-handed Dislocation: no     Past Medical History:  Diagnosis Date  . Abnormal CT of the abdomen   . Diabetes mellitus without complication ([place])   . Gastric mass   . Hypercholesterolemia   . Hypertension   . Myocardial infarct   . Personal history of tobacco use, presenting hazards to health 07/13/2015  . Pneumonia   . Stenosis of hepatic artery [facility])     Patient Active Problem List   Diagnosis Date Noted  . Personal history of tobacco use, presenting hazards to health 07/13/2015    Past Surgical History:  Procedure Laterality Date  . COLONOSCOPY WITH PROPOFOL N/A 03/26/2015   Procedure: COLONOSCOPY WITH PROPOFOL;  Surgeon: Josefine Class, MD;  Location[facility] ENDOSCOPY;  Service: Endoscopy;  Laterality: N/A;  . KNEE SURGERY         Home Medications    Prior to Admission medications   Medication Sig Start Date End Date Taking? Authorizing Provider  aspirin 81 MG tablet Take 81 mg by mouth daily.   Yes Historical Provider, MD  Choline Fenofibrate (FENOFIBRIC ACID) 135 MG CPDR Take 135 mg by mouth at bedtime.   Yes Historical Provider, MD  clopidogrel (PLAVIX) 75 MG tablet Take 75 mg by mouth daily.   Yes Historical Provider, MD  glipiZIDE (GLUCOTROL XL) 5 MG 24 hr tablet Take 5 mg by mouth daily with breakfast.   Yes Historical Provider, MD  lisinopril (PRINIVIL,ZESTRIL) 2.5 MG tablet Take 2.5 mg by mouth daily.   Yes Historical Provider, MD  metFORMIN (GLUCOPHAGE) 500 MG tablet Take 2,000 mg by mouth daily.   Yes Historical Provider, MD  metoprolol (LOPRESSOR) 50 MG tablet Take 50 mg by mouth 2 (two) times daily.   Yes Historical Provider, MD  Omega-3 Fatty Acids (FISH OIL) 1000 MG CAPS Take 1,000 mg by mouth daily.   Yes Historical Provider, MD  rosuvastatin (CRESTOR) 20 MG tablet Take 20 mg by mouth daily.   Yes Historical Provider,  MD  simvastatin (ZOCOR) 80 MG tablet Take 80 mg by mouth daily.   Yes Historical Provider, MD  Vitamin D, Ergocalciferol, (DRISDOL) 50000 UNITS CAPS capsule Take 50,000 Units by mouth every 7 (seven) days.   Yes Historical Provider, MD  traMADol (ULTRAM) 50 MG tablet Take 1 tablet (50 mg total) by mouth every 6 (six) hours as needed. 07/13/16   Frederich Cha, MD    Family History History reviewed. No pertinent family history.  Social History Social History  Substance Use Topics  . Smoking status: Current Every Day Smoker    Packs/day: 1.00    Years: 30.00    Types: Cigarettes  . Smokeless tobacco: Never Used  . Alcohol use No     Allergies   Ibuprofen and Tylenol [acetaminophen]   Review of Systems Review of  Systems  Musculoskeletal: Positive for arthralgias, joint swelling and myalgias.  All other systems reviewed and are negative.    Physical Exam Triage Vital Signs ED Triage Vitals  Enc Vitals Group     BP 07/13/16 1451 133/67     Pulse Rate 07/13/16 1451 93     Resp 07/13/16 1451 17     Temp 07/13/16 1451 98.3 F (36.8 C)     Temp Source 07/13/16 1451 Oral     SpO2 07/13/16 1451 96 %     Weight 07/13/16 1446 260 lb (117.9 kg)     Height 07/13/16 1446 6\' 1"  (1.854 m)     Head Circumference --      Peak Flow --      Pain Score 07/13/16 1449 9     Pain Loc --      Pain Edu? --      Excl. in [place]? --    No data found.   Updated Vital Signs BP 133/67 (BP Location: Left Arm)   Pulse 93   Temp 98.3 F (36.8 C) (Oral)   Resp 17   Ht 6\' 1"  (1.854 m)   Wt 260 lb (117.9 kg)   SpO2 96%   BMI 34.30 kg/m   Visual Acuity Right Eye Distance:   Left Eye Distance:   Bilateral Distance:    Right Eye Near:   Left Eye Near:    Bilateral Near:     Physical Exam  Constitutional: He appears well-developed and well-nourished.  HENT:  Head: Normocephalic.  Eyes: Pupils are equal, round, and reactive to light.  Neck: Normal range of motion.  Pulmonary/Chest: Effort normal.  Musculoskeletal: He exhibits edema, tenderness and deformity.  Neurological: He is alert.  Skin: Skin is warm.  Psychiatric: He has a normal mood and affect. His behavior is normal.  Vitals reviewed.    UC Treatments / Results  Labs (all labs ordered are listed, but only abnormal results are displayed) Labs Reviewed - No data to display  EKG  EKG Interpretation None       Radiology Dg Elbow Complete Right  Result Date: 07/13/2016 CLINICAL DATA:  Fall.  Elbow pain EXAM: RIGHT ELBOW - COMPLETE 3+ VIEW COMPARISON:  None. FINDINGS: Normal alignment no fracture. Extensive degenerative changes. Multiple soft tissue calcification may be calcified loose bodies. Olecranon spurring. Coronoid process  spurring. Mild spurring of the radial head. Ossicles in the medial collateral ligament region could be within the joint or in the ligament. Posterior soft tissue swelling may reflect bursitis or acute soft tissue swelling. IMPRESSION: Extensive degenerative change.  Negative for fracture. Electronically Signed   By: Franchot Gallo M.D.  On: 07/13/2016 15:13    Procedures Procedures (including critical care time)  Medications Ordered in UC Medications  ketorolac (TORADOL) injection 60 mg (60 mg Intramuscular Given 07/13/16 1537)     Initial Impression / Assessment and Plan / UC Course  I have reviewed the triage vital signs and the nursing notes.  Pertinent labs & imaging results that were available during my care of the patient were reviewed by me and considered in my medical decision making (see chart for details).  Clinical Course as of Jul 13 1550  Thu Jul 13, 2016  1513 DG Elbow Complete Right [EW]  [id number] DG Elbow Complete Right [EW]    Clinical Course User Index [EW] Frederich Cha, MD    X-ray of the right elbow was negative however radiologist's deep breathing of his logic changes present in the elbow. We'll place patient sling because his had stomach surgery we'll hold off on putting him on anti-inflammatory will give him 60 Toradol while he is here. He states that Toradol has been effective pain in the past he's also received Percocet and he was looked up at the [employer] drug reporting system website and is been a while since she's had Percocet so we'll give him a prescription for tramadol. X-ray of the right elbow in a week ago but  Final Clinical Impressions(s) / UC Diagnoses   Final diagnoses:  Contusion of right elbow, initial encounter  Osteoarthritis of right elbow, unspecified osteoarthritis type    New Prescriptions New Prescriptions   TRAMADOL (ULTRAM) 50 MG TABLET    Take 1 tablet (50 mg total) by mouth every 6 (six) hours as needed.    Results for orders  placed or performed during the hospital encounter of 03/26/15  Glucose, capillary  Result Value Ref Range   Glucose-Capillary 138 (H) 65 - 99 mg/dL  Surgical pathology  Result Value Ref Range   SURGICAL PATHOLOGY      Surgical Pathology CASE: [id number] PATIENT: Gilmer Mor Surgical Pathology Report     SPECIMEN SUBMITTED: A. Duodenal bulb nodule, cbx B. GEJ, cbx C. Colon polyp, transverse, cbx D. Colon polyp, cecum, cbx  CLINICAL HISTORY: None provided  PRE-OPERATIVE DIAGNOSIS: Abnormal CT scan  POST-OPERATIVE DIAGNOSIS: Duodenal bulb nodule, esophagitis, colon polyps     DIAGNOSIS: A. DUODENAL BULB NODULE; COLD BIOPSY: - DUODENAL MUCOSA WITH NODULAR BRUNNER GLAND HYPERPLASIA. - NEGATIVE FOR INTRA-EPITHELIAL LYMPHOCYTOSIS, DYSPLASIA AND MALIGNANCY.  B. GE JUNCTION; COLD BIOPSY: - SQUAMOCOLUMNAR MUCOSA WITH REFLUX GASTROESOPHAGITIS. - NEGATIVE FOR INTESTINAL METAPLASIA, DYSPLASIA AND MALIGNANCY.  C. COLON POLYP, TRANSVERSE; COLD BIOPSY: - TUBULAR ADENOMA (1), NEGATIVE FOR HIGH-GRADE DYSPLASIA AND MALIGNANCY. - HYPERPLASTIC POLYP (1), NEGATIVE FOR DYSPLASIA AND MALIGNANCY.  D. COLON POLYP, CECUM; COLD BIOPSY: - POLYPOID COLONIC MUCOSA WITHIN NORMAL LIMITS.     GROSS DESCRIPTION: A. Labeled: Cold biopsy GEJ Tissue Fragment(s): 1 Measurement: 0.5 cm Comment: Tan tissue fragment  Entirely submitted in cassette(s): 1  B. Labeled: Cold biopsy cecal polyp Tissue Fragment(s): 3 Measurement: 0.1-0.2 cm Comment: Pink white tissue fragments  Entirely submitted in cassette(s): 1  C. Labeled: Cold biopsy transverse colon polyp Tissue Fragment(s): 3 Measurement: 0.2-0.3 cm Comment: Tan tissue fragments  Entirely submitted in cassette(s): 1  D. Labeled: Cold biopsy duodenal bulb nodule Tissue Fragment(s): 1 Measurement: 0.4 cm Comment: Tan tissue fragment  Entirely submitted in cassette(s): 1        Final Diagnosis performed by Delorse Lek,  MD.  Electronically signed 03/30/2015 11:41:58AM    The electronic signature indicates that  the named Attending Pathologist has evaluated the specimen  Technical component performed at [facility], [address] [place], [place] [address] Lab: [phone] Dir: Darrick Penna. Truitt Merle, MD  Professional component performed at [facility], [facility], [place], [place], [place] [address] Lab: [phone] Dir: Dellia Nims. Reuel Derby, MD       Frederich Cha, MD 07/13/16 [id number]

## 2016-10-02 ENCOUNTER — Encounter: Payer: Self-pay | Admitting: *Deleted

## 2016-10-28 ENCOUNTER — Emergency Department
Admission: EM | Admit: 2016-10-28 | Discharge: 2016-10-28 | Disposition: A | Discharge status: Home / Self Care | Payer: Commercial Managed Care - PPO | Attending: Emergency Medicine | Admitting: Emergency Medicine

## 2016-10-28 ENCOUNTER — Emergency Department: Payer: Commercial Managed Care - PPO

## 2016-10-28 ENCOUNTER — Encounter: Payer: Self-pay | Admitting: Medical Oncology

## 2016-10-28 DIAGNOSIS — Y999 Unspecified external cause status: Secondary | ICD-10-CM | POA: Insufficient documentation

## 2016-10-28 DIAGNOSIS — Y92410 Unspecified street and highway as the place of occurrence of the external cause: Secondary | ICD-10-CM | POA: Diagnosis not present

## 2016-10-28 DIAGNOSIS — I252 Old myocardial infarction: Secondary | ICD-10-CM | POA: Diagnosis not present

## 2016-10-28 DIAGNOSIS — M545 Low back pain: Secondary | ICD-10-CM | POA: Diagnosis not present

## 2016-10-28 DIAGNOSIS — Z79899 Other long term (current) drug therapy: Secondary | ICD-10-CM | POA: Insufficient documentation

## 2016-10-28 DIAGNOSIS — Z7984 Long term (current) use of oral hypoglycemic drugs: Secondary | ICD-10-CM | POA: Insufficient documentation

## 2016-10-28 DIAGNOSIS — E119 Type 2 diabetes mellitus without complications: Secondary | ICD-10-CM | POA: Insufficient documentation

## 2016-10-28 DIAGNOSIS — I10 Essential (primary) hypertension: Secondary | ICD-10-CM | POA: Diagnosis not present

## 2016-10-28 DIAGNOSIS — Y9389 Activity, other specified: Secondary | ICD-10-CM | POA: Diagnosis not present

## 2016-10-28 DIAGNOSIS — Z7982 Long term (current) use of aspirin: Secondary | ICD-10-CM | POA: Diagnosis not present

## 2016-10-28 DIAGNOSIS — F17210 Nicotine dependence, cigarettes, uncomplicated: Secondary | ICD-10-CM | POA: Insufficient documentation

## 2016-10-28 DIAGNOSIS — S3992XA Unspecified injury of lower back, initial encounter: Secondary | ICD-10-CM | POA: Diagnosis present

## 2016-10-28 MED ORDER — CYCLOBENZAPRINE HCL 5 MG PO TABS: 2.5000 mg | ORAL_TABLET | Freq: Three times a day (TID) | ORAL | 0 refills | Status: AC | PRN

## 2016-10-28 MED ORDER — NAPROXEN 500 MG PO TABS
500.0000 mg | ORAL_TABLET | Freq: Once | ORAL | Status: AC
  Administered 2016-10-28: 500 mg via ORAL
  Filled 2016-10-28: qty 1
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
## 2017-01-25 ENCOUNTER — Emergency Department: Payer: Commercial Managed Care - PPO

## 2017-01-25 ENCOUNTER — Encounter: Payer: Self-pay | Admitting: Emergency Medicine

## 2017-01-25 ENCOUNTER — Emergency Department
Admission: EM | Admit: 2017-01-25 | Discharge: 2017-01-26 | Disposition: A | Discharge status: Home / Self Care | Payer: Commercial Managed Care - PPO | Attending: Emergency Medicine | Admitting: Emergency Medicine

## 2017-01-25 DIAGNOSIS — L02415 Cutaneous abscess of right lower limb: Secondary | ICD-10-CM | POA: Diagnosis not present

## 2017-01-25 DIAGNOSIS — Z7982 Long term (current) use of aspirin: Secondary | ICD-10-CM | POA: Diagnosis not present

## 2017-01-25 DIAGNOSIS — L03115 Cellulitis of right lower limb: Secondary | ICD-10-CM | POA: Insufficient documentation

## 2017-01-25 DIAGNOSIS — M129 Arthropathy, unspecified: Secondary | ICD-10-CM | POA: Diagnosis present

## 2017-01-25 DIAGNOSIS — F17210 Nicotine dependence, cigarettes, uncomplicated: Secondary | ICD-10-CM | POA: Diagnosis not present

## 2017-01-25 DIAGNOSIS — Z7902 Long term (current) use of antithrombotics/antiplatelets: Secondary | ICD-10-CM | POA: Insufficient documentation

## 2017-01-25 DIAGNOSIS — Z79899 Other long term (current) drug therapy: Secondary | ICD-10-CM | POA: Diagnosis not present

## 2017-01-25 DIAGNOSIS — E119 Type 2 diabetes mellitus without complications: Secondary | ICD-10-CM | POA: Diagnosis not present

## 2017-01-25 DIAGNOSIS — Z7984 Long term (current) use of oral hypoglycemic drugs: Secondary | ICD-10-CM | POA: Insufficient documentation

## 2017-01-25 DIAGNOSIS — I10 Essential (primary) hypertension: Secondary | ICD-10-CM | POA: Insufficient documentation

## 2017-01-25 LAB — CBC WITH DIFFERENTIAL/PLATELET
Basophils Absolute: 0.1 10*3/uL (ref 0–0.1)
Basophils Relative: 1 %
Eosinophils Absolute: 0.4 10*3/uL (ref 0–0.7)
Eosinophils Relative: 3 %
HCT: 42 % (ref 40.0–52.0)
Hemoglobin: 14.6 g/dL (ref 13.0–18.0)
Lymphocytes Relative: 16 %
Lymphs Abs: 2 10*3/uL (ref 1.0–3.6)
MCH: 33.1 pg (ref 26.0–34.0)
MCHC: 34.6 g/dL (ref 32.0–36.0)
MCV: 95.6 fL (ref 80.0–100.0)
Monocytes Absolute: 1 10*3/uL (ref 0.2–1.0)
Monocytes Relative: 8 %
Neutro Abs: 8.9 10*3/uL — ABNORMAL HIGH (ref 1.4–6.5)
Neutrophils Relative %: 72 %
Platelets: 135 10*3/uL — ABNORMAL LOW (ref 150–440)
RBC: 4.4 MIL/uL (ref 4.40–5.90)
RDW: 14 % (ref 11.5–14.5)
WBC: 12.3 10*3/uL — ABNORMAL HIGH (ref 3.8–10.6)

## 2017-01-25 LAB — BASIC METABOLIC PANEL
Anion gap: 10 (ref 5–15)
BUN: 19 mg/dL (ref 6–20)
CO2: 27 mmol/L (ref 22–32)
Calcium: 9.5 mg/dL (ref 8.9–10.3)
Chloride: 102 mmol/L (ref 101–111)
Creatinine, Ser: 1.18 mg/dL (ref 0.61–1.24)
GFR calc Af Amer: >60 mL/min (ref >60)
GFR calc non Af Amer: >60 mL/min (ref >60)
Glucose, Bld: 126 mg/dL — ABNORMAL HIGH (ref 65–99)
Potassium: 3.7 mmol/L (ref 3.5–5.1)
Sodium: 139 mmol/L (ref 135–145)

## 2017-01-25 MED ORDER — SODIUM CHLORIDE 0.9 % IV BOLUS (SEPSIS)
1000.0000 mL | Freq: Once | INTRAVENOUS | Status: AC
  Administered 2017-01-25: 1000 mL via INTRAVENOUS

## 2017-01-25 MED ORDER — SULFAMETHOXAZOLE-TRIMETHOPRIM 800-160 MG PO TABS: 2.0000 | ORAL_TABLET | Freq: Two times a day (BID) | ORAL | 0 refills | Status: DC

## 2017-01-25 MED ORDER — BACITRACIN ZINC 500 UNIT/GM EX OINT
TOPICAL_OINTMENT | CUTANEOUS | Status: AC
  Filled 2017-01-25: qty 0.9

## 2017-01-25 MED ORDER — SULFAMETHOXAZOLE-TRIMETHOPRIM 800-160 MG PO TABS
2.0000 | ORAL_TABLET | Freq: Once | ORAL | Status: AC
  Administered 2017-01-25: 2 via ORAL
  Filled 2017-01-25: qty 2

## 2017-01-25 MED ORDER — CLINDAMYCIN PHOSPHATE 600 MG/50ML IV SOLN
600.0000 mg | Freq: Once | INTRAVENOUS | Status: AC
  Administered 2017-01-25: 600 mg via INTRAVENOUS
  Filled 2017-01-25: qty 50

## 2017-01-25 MED ORDER — NAPROXEN 500 MG PO TABS
500.0000 mg | ORAL_TABLET | Freq: Once | ORAL | Status: AC
  Administered 2017-01-25: 500 mg via ORAL
  Filled 2017-01-25: qty 1

## 2017-01-25 NOTE — Discharge Instructions (Signed)
Keep the wound clean, dry, and covered. Take the antibiotic as directed. Rest with the leg elevated and apply warm compresses to promote healing. Follow-up with [facility] or return to the ED for signs of worsening infection.

## 2017-01-25 NOTE — ED Triage Notes (Signed)
Pt to triage in wheelchair due to swelling in right knee. Pt reports swelling and redness to the right knee x1 day. Pt denies drainage. 

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
## 2017-01-26 MED ORDER — CLINDAMYCIN HCL 300 MG PO CAPS: 300.0000 mg | ORAL_CAPSULE | Freq: Four times a day (QID) | ORAL | 0 refills | Status: DC

## 2017-01-28 DIAGNOSIS — Z7902 Long term (current) use of antithrombotics/antiplatelets: Secondary | ICD-10-CM | POA: Diagnosis not present

## 2017-01-28 DIAGNOSIS — Z8249 Family history of ischemic heart disease and other diseases of the circulatory system: Secondary | ICD-10-CM | POA: Insufficient documentation

## 2017-01-28 DIAGNOSIS — I2510 Atherosclerotic heart disease of native coronary artery without angina pectoris: Secondary | ICD-10-CM | POA: Diagnosis not present

## 2017-01-28 DIAGNOSIS — Z886 Allergy status to analgesic agent status: Secondary | ICD-10-CM | POA: Insufficient documentation

## 2017-01-28 DIAGNOSIS — I252 Old myocardial infarction: Secondary | ICD-10-CM | POA: Insufficient documentation

## 2017-01-28 DIAGNOSIS — Z7984 Long term (current) use of oral hypoglycemic drugs: Secondary | ICD-10-CM | POA: Insufficient documentation

## 2017-01-28 DIAGNOSIS — I10 Essential (primary) hypertension: Secondary | ICD-10-CM | POA: Diagnosis not present

## 2017-01-28 DIAGNOSIS — Y93E1 Activity, personal bathing and showering: Secondary | ICD-10-CM | POA: Insufficient documentation

## 2017-01-28 DIAGNOSIS — W182XXA Fall in (into) shower or empty bathtub, initial encounter: Secondary | ICD-10-CM | POA: Insufficient documentation

## 2017-01-28 DIAGNOSIS — Z96651 Presence of right artificial knee joint: Secondary | ICD-10-CM | POA: Insufficient documentation

## 2017-01-28 DIAGNOSIS — M25461 Effusion, right knee: Secondary | ICD-10-CM | POA: Insufficient documentation

## 2017-01-28 DIAGNOSIS — F17210 Nicotine dependence, cigarettes, uncomplicated: Secondary | ICD-10-CM | POA: Insufficient documentation

## 2017-01-28 DIAGNOSIS — Z79899 Other long term (current) drug therapy: Secondary | ICD-10-CM | POA: Diagnosis not present

## 2017-01-28 DIAGNOSIS — Z85028 Personal history of other malignant neoplasm of stomach: Secondary | ICD-10-CM | POA: Diagnosis not present

## 2017-01-28 DIAGNOSIS — L03115 Cellulitis of right lower limb: Secondary | ICD-10-CM | POA: Diagnosis present

## 2017-01-28 DIAGNOSIS — E7800 Pure hypercholesterolemia, unspecified: Secondary | ICD-10-CM | POA: Insufficient documentation

## 2017-01-28 DIAGNOSIS — E119 Type 2 diabetes mellitus without complications: Secondary | ICD-10-CM | POA: Diagnosis not present

## 2017-01-28 DIAGNOSIS — Y998 Other external cause status: Secondary | ICD-10-CM | POA: Diagnosis not present

## 2017-01-28 DIAGNOSIS — M25561 Pain in right knee: Secondary | ICD-10-CM | POA: Insufficient documentation

## 2017-01-28 DIAGNOSIS — Z7982 Long term (current) use of aspirin: Secondary | ICD-10-CM | POA: Diagnosis not present

## 2017-01-28 DIAGNOSIS — D72829 Elevated white blood cell count, unspecified: Secondary | ICD-10-CM | POA: Insufficient documentation

## 2017-01-28 LAB — CBC WITH DIFFERENTIAL/PLATELET
Basophils Absolute: 0.1 10*3/uL (ref 0–0.1)
Basophils Relative: 1 %
Eosinophils Absolute: 0.4 10*3/uL (ref 0–0.7)
Eosinophils Relative: 3 %
HCT: 40.3 % (ref 40.0–52.0)
Hemoglobin: 14.1 g/dL (ref 13.0–18.0)
Lymphocytes Relative: 18 %
Lymphs Abs: 2.4 10*3/uL (ref 1.0–3.6)
MCH: 32.4 pg (ref 26.0–34.0)
MCHC: 34.9 g/dL (ref 32.0–36.0)
MCV: 93 fL (ref 80.0–100.0)
Monocytes Absolute: 0.7 10*3/uL (ref 0.2–1.0)
Monocytes Relative: 6 %
Neutro Abs: 9.4 10*3/uL — ABNORMAL HIGH (ref 1.4–6.5)
Neutrophils Relative %: 72 %
Platelets: 162 10*3/uL (ref 150–440)
RBC: 4.34 MIL/uL — ABNORMAL LOW (ref 4.40–5.90)
RDW: 13.7 % (ref 11.5–14.5)
WBC: 12.9 10*3/uL — ABNORMAL HIGH (ref 3.8–10.6)

## 2017-01-28 LAB — BASIC METABOLIC PANEL
Anion gap: 10 (ref 5–15)
BUN: 18 mg/dL (ref 6–20)
CO2: 25 mmol/L (ref 22–32)
Calcium: 9.4 mg/dL (ref 8.9–10.3)
Chloride: 99 mmol/L — ABNORMAL LOW (ref 101–111)
Creatinine, Ser: 1.04 mg/dL (ref 0.61–1.24)
GFR calc Af Amer: >60 mL/min (ref >60)
GFR calc non Af Amer: >60 mL/min (ref >60)
Glucose, Bld: 103 mg/dL — ABNORMAL HIGH (ref 65–99)
Potassium: 3.8 mmol/L (ref 3.5–5.1)
Sodium: 134 mmol/L — ABNORMAL LOW (ref 135–145)

## 2017-01-28 NOTE — ED Triage Notes (Signed)
Patient reports seen in ED couple days ago and received antibiotics for infection to right knee and has been taking antibiotics as prescribed.  Reports area looks worse and now unable to bear weight.

## 2017-01-29 ENCOUNTER — Encounter: Payer: Self-pay | Admitting: Internal Medicine

## 2017-01-29 ENCOUNTER — Observation Stay
Admission: EM | Admit: 2017-01-29 | Discharge: 2017-01-30 | Disposition: A | Discharge status: Home / Self Care | Payer: Commercial Managed Care - PPO | Attending: Specialist | Admitting: Specialist

## 2017-01-29 DIAGNOSIS — L0390 Cellulitis, unspecified: Secondary | ICD-10-CM | POA: Diagnosis present

## 2017-01-29 DIAGNOSIS — L03115 Cellulitis of right lower limb: Secondary | ICD-10-CM

## 2017-01-29 DIAGNOSIS — E119 Type 2 diabetes mellitus without complications: Secondary | ICD-10-CM

## 2017-01-29 HISTORY — DX: Atherosclerotic heart disease of native coronary artery without angina pectoris: I25.10

## 2017-01-29 LAB — GLUCOSE, CAPILLARY
Glucose-Capillary: 55 mg/dL — ABNORMAL LOW (ref 65–99)
Glucose-Capillary: 61 mg/dL — ABNORMAL LOW (ref 65–99)
Glucose-Capillary: 66 mg/dL (ref 65–99)
Glucose-Capillary: 117 mg/dL — ABNORMAL HIGH (ref 65–99)
Glucose-Capillary: 126 mg/dL — ABNORMAL HIGH (ref 65–99)
Glucose-Capillary: 178 mg/dL — ABNORMAL HIGH (ref 65–99)
Glucose-Capillary: 174 mg/dL — ABNORMAL HIGH (ref 65–99)

## 2017-01-29 LAB — TSH: TSH: 5.37 u[IU]/mL — ABNORMAL HIGH (ref 0.350–4.500)

## 2017-01-29 LAB — MRSA PCR SCREENING: MRSA by PCR: POSITIVE — AB

## 2017-01-29 MED ORDER — VANCOMYCIN HCL 10 G IV SOLR
1250.0000 mg | Freq: Two times a day (BID) | INTRAVENOUS | Status: DC
  Administered 2017-01-29 (×2): 1250 mg via INTRAVENOUS
  Filled 2017-01-29 (×4): qty 1250

## 2017-01-29 MED ORDER — LIDOCAINE-EPINEPHRINE 2 %-1:100000 IJ SOLN
INTRAMUSCULAR | Status: AC
  Filled 2017-01-29: qty 1.7

## 2017-01-29 MED ORDER — ONDANSETRON HCL 4 MG/2ML IJ SOLN: 4.0000 mg | Freq: Four times a day (QID) | INTRAMUSCULAR | Status: DC | PRN

## 2017-01-29 MED ORDER — CLOPIDOGREL BISULFATE 75 MG PO TABS
75.0000 mg | ORAL_TABLET | Freq: Every day | ORAL | Status: DC
  Administered 2017-01-29 – 2017-01-30 (×2): 75 mg via ORAL
  Filled 2017-01-29 (×2): qty 1

## 2017-01-29 MED ORDER — ASPIRIN 81 MG PO CHEW
81.0000 mg | CHEWABLE_TABLET | Freq: Every day | ORAL | Status: DC
  Administered 2017-01-29 – 2017-01-30 (×2): 81 mg via ORAL
  Filled 2017-01-29 (×2): qty 1

## 2017-01-29 MED ORDER — MORPHINE SULFATE (PF) 10 MG/ML IV SOLN
INTRAVENOUS | Status: AC
  Filled 2017-01-29: qty 1

## 2017-01-29 MED ORDER — INSULIN ASPART 100 UNIT/ML ~~LOC~~ SOLN
0.0000 [IU] | Freq: Three times a day (TID) | SUBCUTANEOUS | Status: DC
Start: 2017-01-29 — End: 2017-01-30
  Administered 2017-01-29 – 2017-01-30 (×3): 2 [IU] via SUBCUTANEOUS
  Filled 2017-01-29 (×3): qty 1

## 2017-01-29 MED ORDER — PIPERACILLIN-TAZOBACTAM 3.375 G IVPB 30 MIN
3.3750 g | Freq: Once | INTRAVENOUS | Status: AC
  Administered 2017-01-29: 3.375 g via INTRAVENOUS
  Filled 2017-01-29: qty 50

## 2017-01-29 MED ORDER — MORPHINE SULFATE (PF) 2 MG/ML IV SOLN
2.0000 mg | Freq: Once | INTRAVENOUS | Status: AC
  Administered 2017-01-29: 2 mg via INTRAVENOUS

## 2017-01-29 MED ORDER — METOPROLOL TARTRATE 50 MG PO TABS
50.0000 mg | ORAL_TABLET | Freq: Two times a day (BID) | ORAL | Status: DC
  Administered 2017-01-29 – 2017-01-30 (×3): 50 mg via ORAL
  Filled 2017-01-29 (×3): qty 1

## 2017-01-29 MED ORDER — MUPIROCIN 2 % EX OINT
1.0000 "application " | TOPICAL_OINTMENT | Freq: Two times a day (BID) | CUTANEOUS | Status: DC
  Administered 2017-01-29 – 2017-01-30 (×2): 1 via NASAL
  Filled 2017-01-29: qty 22

## 2017-01-29 MED ORDER — DOCUSATE SODIUM 100 MG PO CAPS
100.0000 mg | ORAL_CAPSULE | Freq: Two times a day (BID) | ORAL | Status: DC
  Administered 2017-01-29 (×2): 100 mg via ORAL
  Filled 2017-01-29 (×3): qty 1

## 2017-01-29 MED ORDER — MORPHINE SULFATE (PF) 2 MG/ML IV SOLN
INTRAVENOUS | Status: AC
  Administered 2017-01-29: 2 mg via INTRAVENOUS
  Filled 2017-01-29: qty 1

## 2017-01-29 MED ORDER — OMEGA-3-ACID ETHYL ESTERS 1 G PO CAPS
1.0000 g | ORAL_CAPSULE | Freq: Every day | ORAL | Status: DC
  Administered 2017-01-30: 1 g via ORAL
  Filled 2017-01-29 (×2): qty 1

## 2017-01-29 MED ORDER — ATORVASTATIN CALCIUM 20 MG PO TABS
40.0000 mg | ORAL_TABLET | Freq: Every day | ORAL | Status: DC
  Administered 2017-01-29: 40 mg via ORAL
  Filled 2017-01-29: qty 2

## 2017-01-29 MED ORDER — VITAMIN D (ERGOCALCIFEROL) 1.25 MG (50000 UNIT) PO CAPS
50000.0000 [IU] | ORAL_CAPSULE | ORAL | Status: DC
  Administered 2017-01-29: 50000 [IU] via ORAL
  Filled 2017-01-29: qty 1

## 2017-01-29 MED ORDER — FENOFIBRIC ACID 135 MG PO CPDR: 135.0000 mg | DELAYED_RELEASE_CAPSULE | Freq: Every day | ORAL | Status: DC

## 2017-01-29 MED ORDER — LISINOPRIL 5 MG PO TABS
2.5000 mg | ORAL_TABLET | Freq: Every day | ORAL | Status: DC
  Administered 2017-01-29: 2.5 mg via ORAL
  Filled 2017-01-29: qty 1

## 2017-01-29 MED ORDER — INSULIN ASPART 100 UNIT/ML ~~LOC~~ SOLN: 0.0000 [IU] | Freq: Every day | SUBCUTANEOUS | Status: DC

## 2017-01-29 MED ORDER — PIPERACILLIN-TAZOBACTAM 3.375 G IVPB
3.3750 g | Freq: Three times a day (TID) | INTRAVENOUS | Status: DC
  Administered 2017-01-29 – 2017-01-30 (×3): 3.375 g via INTRAVENOUS
  Filled 2017-01-29 (×5): qty 50

## 2017-01-29 MED ORDER — ENOXAPARIN SODIUM 40 MG/0.4ML ~~LOC~~ SOLN
40.0000 mg | SUBCUTANEOUS | Status: DC
  Filled 2017-01-29: qty 0.4

## 2017-01-29 MED ORDER — FENOFIBRATE 145 MG PO TABS
145.0000 mg | ORAL_TABLET | Freq: Every day | ORAL | Status: DC
  Administered 2017-01-29 – 2017-01-30 (×2): 145 mg via ORAL
  Filled 2017-01-29 (×2): qty 1

## 2017-01-29 MED ORDER — CHLORHEXIDINE GLUCONATE CLOTH 2 % EX PADS
6.0000 | MEDICATED_PAD | Freq: Every day | CUTANEOUS | Status: DC
  Administered 2017-01-29: 6 via TOPICAL

## 2017-01-29 MED ORDER — VANCOMYCIN HCL IN DEXTROSE 1-5 GM/200ML-% IV SOLN
1000.0000 mg | Freq: Once | INTRAVENOUS | Status: AC
  Administered 2017-01-29: 1000 mg via INTRAVENOUS

## 2017-01-29 MED ORDER — ONDANSETRON HCL 4 MG PO TABS: 4.0000 mg | ORAL_TABLET | Freq: Four times a day (QID) | ORAL | Status: DC | PRN

## 2017-01-29 NOTE — Consult Note (Signed)
ORTHOPAEDIC CONSULTATION   REQUESTING PHYSICIAN: Henreitta Leber, MD  Chief Complaint: Right knee pain and swelling  HPI: Kevin Ryan is a 66 y.o. male who complains of  right knee pain and swelling for several days.  The patient is a diabetic and was working on his hands and knees a few days ago.  He developed an abrasion and then a scab.  He developed redness and swelling and was seen in the [facility] ER 01/25/2017.  He was started on oral antibiotics.  His condition worsened and he returned to the emergency room earlier today.  He is afebrile today.  His white count is 12,900 as opposed to 12,300 4 days ago.  He has been started on IV vancomycin and Zosyn.  He has a history of a unicompartmental right knee replacement done in  [place] years ago.   Past Medical History:  Diagnosis Date  . Abnormal CT of the abdomen   . CAD (coronary artery disease)    s/p MI  . Diabetes mellitus without complication ([place])   . 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 Hypercholesterolemia   . Hypertension   . Personal history of tobacco use, presenting hazards to health 07/13/2015  . Pneumonia   . 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 COLONOSCOPY WITH PROPOFOL N/A 03/26/2015   Procedure: COLONOSCOPY WITH PROPOFOL;  Surgeon: Josefine Class, MD;  Location: [facility] ENDOSCOPY;  Service: Endoscopy;  Laterality: N/A;  . KNEE SURGERY     Social History   Social History  . Marital status: Married    Spouse name: N/A  . Number of children: N/A  . Years of education: N/A   Social History Main Topics  . Smoking status: Current Every Day Smoker    Packs/day: 1.00    Years: 30.00    Types: Cigarettes  . Smokeless tobacco: Never Used  . Alcohol use No  . Drug use: No  . Sexual activity: Yes    Birth control/ protection: None   Other Topics Concern  . None   Social History Narrative  . None   Family History  Problem  Relation Age of Onset  . Diabetes Mellitus II Maternal Grandfather   . CAD Paternal Grandfather    Allergies  Allergen Reactions  . Ibuprofen Itching  . Tylenol [Acetaminophen] Itching   Prior to Admission medications   Medication Sig Start Date End Date Taking? Authorizing Provider  aspirin 81 MG tablet Take 81 mg by mouth daily.   Yes [provider]  Choline Fenofibrate (FENOFIBRIC ACID) 135 MG CPDR Take 135 mg by mouth at bedtime.   Yes [provider]  clindamycin (CLEOCIN) 300 MG capsule Take 1 capsule (300 mg total) by mouth 4 (four) times daily. 01/26/17  Yes Menshew, Dannielle Karvonen, PA-C  clopidogrel (PLAVIX) 75 MG tablet Take 75 mg by mouth daily.   Yes [provider]  glipiZIDE (GLUCOTROL XL) 5 MG 24 hr tablet Take 5 mg by mouth daily with breakfast.   Yes [provider]  lisinopril (PRINIVIL,ZESTRIL) 2.5 MG tablet Take 2.5 mg by mouth at bedtime.    Yes [provider]  metFORMIN (GLUCOPHAGE-XR) 500 MG 24 hr tablet Take 2,000 mg by mouth daily with breakfast.   Yes [provider]  metoprolol (LOPRESSOR) 50 MG tablet Take 50 mg by mouth 2 (two) times daily.   Yes [provider]  Omega-3  Fatty Acids (FISH OIL) 1000 MG CAPS Take 1,000 mg by mouth daily.   Yes [provider]  simvastatin (ZOCOR) 80 MG tablet Take 80 mg by mouth daily.   Yes [provider]  Vitamin D, Ergocalciferol, (DRISDOL) 50000 UNITS CAPS capsule Take 50,000 Units by mouth every 7 (seven) days.   Yes [provider]   No results found.  Positive ROS: All other systems have been reviewed and were otherwise negative with the exception of those mentioned in the HPI and as above.  Physical Exam: General: Alert, no acute distress Cardiovascular: No pedal edema Respiratory: No cyanosis, no use of accessory musculature GI: No organomegaly, abdomen is soft and non-tender Skin: No lesions in the area of chief  complaint Neurologic: Sensation intact distally Psychiatric: Patient is competent for consent with normal mood and affect Lymphatic: No axillary or cervical lymphadenopathy  MUSCULOSKELETAL: Patient is alert and cooperative.  He is lying comfortably in his hospital bed.  There is a superficial abrasion over the anterolateral lateral aspect of the right patella.  There is some surrounding superficial cellulitis.  He has a healed medial scar anteriorly.  There is no effusion or evidence of soft tissue boggy swelling around the knee.  Range of motion is unrestricted except for pain.  Assessment: Superficial cellulitis right knee.  No evidence of deep knee infection.  Plan: Continue IV antibiotics Moist hot compresses, right knee    Park Breed, MD [phone]   01/29/2017 2:13 PM

## 2017-01-29 NOTE — ED Provider Notes (Signed)
[facility] Emergency Department Provider Note  ____________________________________________   First MD Initiated Contact with Patient 01/29/17 [phone]     (approximate)  I have reviewed the triage vital signs and the nursing notes.   HISTORY  Chief Complaint Knee Pain and Wound Infection   HPI Kevin Ryan is a 66 y.o. male with a history of diabetes who is presenting emergency department with worsening right lower extremity infection. He says he was seen in the emergency department several days ago and started on clindamycin. However, since then he is having pain and more redness to his right lower extremity especially cost rerun his right anterior knee. He says he is also having low-grade fever at home to about 100 each night. He has a history of MRSA. He is presenting because of worsening symptoms.   Past Medical History:  Diagnosis Date  . Abnormal CT of the abdomen   . Diabetes mellitus without complication ([place])   . Gastric mass   . Hypercholesterolemia   . Hypertension   . Myocardial infarct ([place])   . Personal history of tobacco use, presenting hazards to health 07/13/2015  . Pneumonia   . 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 Personal history of tobacco use, presenting hazards to health 07/13/2015    Past Surgical History:  Procedure Laterality Date  . COLONOSCOPY WITH PROPOFOL N/A 03/26/2015   Procedure: COLONOSCOPY WITH PROPOFOL;  Surgeon: Josefine Class, MD;  Location: [facility] ENDOSCOPY;  Service: Endoscopy;  Laterality: N/A;  . KNEE SURGERY      Prior to Admission medications   Medication Sig Start Date End Date Taking? Authorizing Provider  aspirin 81 MG tablet Take 81 mg by mouth daily.   Yes [provider]  Choline Fenofibrate (FENOFIBRIC ACID) 135 MG CPDR Take 135 mg by mouth at bedtime.   Yes [provider]  clindamycin (CLEOCIN) 300 MG capsule Take 1  capsule (300 mg total) by mouth 4 (four) times daily. 01/26/17  Yes Menshew, Dannielle Karvonen, PA-C  clopidogrel (PLAVIX) 75 MG tablet Take 75 mg by mouth daily.   Yes [provider]  glipiZIDE (GLUCOTROL XL) 5 MG 24 hr tablet Take 5 mg by mouth daily with breakfast.   Yes [provider]  lisinopril (PRINIVIL,ZESTRIL) 2.5 MG tablet Take 2.5 mg by mouth at bedtime.    Yes [provider]  metFORMIN (GLUCOPHAGE-XR) 500 MG 24 hr tablet Take 2,000 mg by mouth daily with breakfast.   Yes [provider]  metoprolol (LOPRESSOR) 50 MG tablet Take 50 mg by mouth 2 (two) times daily.   Yes [provider]  Omega-3 Fatty Acids (FISH OIL) 1000 MG CAPS Take 1,000 mg by mouth daily.   Yes [provider]  simvastatin (ZOCOR) 80 MG tablet Take 80 mg by mouth daily.   Yes [provider]  Vitamin D, Ergocalciferol, (DRISDOL) 50000 UNITS CAPS capsule Take 50,000 Units by mouth every 7 (seven) days.   Yes [provider]    Allergies Ibuprofen and Tylenol [acetaminophen]  No family history on file.  Social History Social History  Substance Use Topics  . Smoking status: Current Every Day Smoker    Packs/day: 1.00    Years: 30.00    Types: Cigarettes  . Smokeless tobacco: Never Used  . Alcohol use No    Review of Systems  Constitutional: as above Eyes: No visual changes. ENT:  No sore throat. Cardiovascular: Denies chest pain. Respiratory: Denies shortness of breath. Gastrointestinal: No abdominal pain.  No nausea, no vomiting.  No diarrhea.  No constipation. Genitourinary: Negative for dysuria. Musculoskeletal: Negative for back pain. Skin: as above Neurological: Negative for headaches, focal weakness or numbness.   ____________________________________________   PHYSICAL EXAM:  VITAL SIGNS: ED Triage Vitals [01/28/17 2239]  Enc Vitals Group     BP 132/63     Pulse Rate 85     Resp 20     Temp 99.1 F (37.3 C)      Temp Source Oral     SpO2 96 %     Weight      Height      Head Circumference      Peak Flow      Pain Score      Pain Loc      Pain Edu?      Excl. in [place]?     Constitutional: Alert and oriented. Well appearing and in no acute distress. Eyes: Conjunctivae are normal.  Head: Atraumatic. Nose: No congestion/rhinnorhea. Mouth/Throat: Mucous membranes are moist.  Neck: No stridor.   Cardiovascular: Normal rate, regular rhythm. Grossly normal heart sounds.   Respiratory: Normal respiratory effort.  No retractions. Lungs CTAB. Gastrointestinal: Soft and nontender. No distention.  Musculoskeletal: Right lower extremity with tenderness from just above the ankle to above the knee. The patient is able to fully range the right knee without any restriction. There is crusted pus on the anterior of the right knee about 1 cm in total shape. Indurated to about a 2 cm wheal around this area with erythema extending for another 4 cm and on to the anterior of the leg. Neurologic:  Normal speech and language. No gross focal neurologic deficits are appreciated. Skin:  Skin is warm, dry and intact. No rash noted. Psychiatric: Mood and affect are normal. Speech and behavior are normal.  ____________________________________________   LABS (all labs ordered are listed, but only abnormal results are displayed)  Labs Reviewed  CBC WITH DIFFERENTIAL/PLATELET - Abnormal; Notable for the following:       Result Value   WBC 12.9 (*)    RBC 4.34 (*)    Neutro Abs 9.4 (*)    All other components within normal limits  BASIC METABOLIC PANEL - Abnormal; Notable for the following:    Sodium 134 (*)    Chloride 99 (*)    Glucose, Bld 103 (*)    All other components within normal limits   ____________________________________________  EKG   ____________________________________________  RADIOLOGY   ____________________________________________   PROCEDURES  Procedure(s) performed:    Procedures  Critical Care performed:   ____________________________________________   INITIAL IMPRESSION / ASSESSMENT AND PLAN / ED COURSE  Pertinent labs & imaging results that were available during my care of the patient were reviewed by me and considered in my medical decision making (see chart for details).  ----------------------------------------- 3:30 AM on 01/29/2017 -----------------------------------------  Patient with fair of outpatient bionics. Slightly increase with blood cell count. Will be admit to hospital for IV antibiotics at this time. When the plans the patient as well as his wife was at bedside. Signed out to Dr. Marcille Blanco.      ____________________________________________   FINAL CLINICAL IMPRESSION(S) / ED DIAGNOSES  Cellulitis    NEW MEDICATIONS STARTED DURING THIS VISIT:  New Prescriptions   No medications on file     Note:  This document was prepared using Dragon voice  recognition software and may include unintentional dictation errors.     Orbie Pyo, MD 01/29/17 0330

## 2017-01-29 NOTE — Progress Notes (Signed)
Lab notified nurse pts MRSA PCR positive, (pt is on contact precautions already). Contact precautions standing orders, ordered. MD Baldwin notified.

## 2017-01-29 NOTE — H&P (Addendum)
Kevin Ryan is an 66 y.o. male.   Chief Complaint: Wound infection HPI: The patient with past medical history of CAD status post MI, gastric cancer status post resection and in remission, hypertension and diabetes presents emergency department with right knee pain secondary to wound infection. The patient had been seen in the emergency department 3 days ago for the same and placed on clindamycin. Since that time his knee has become more tender and swollen. The patient slipped in the shower yesterday and was unable to catch his balance on his right leg due to pain which resulted in a fall. He did not injure his head, but since that time he has noticed more pain in his knee. He denies any decrease in range of motion. However, he does admit to subjective fever and chills. The patient also admits to nausea but he denies vomiting. The patient received a dose of vancomycin and Zosyn in the emergency department. Once the hospitalist service was called incision and drainage of the fluctuant region of the wound was performed by the admitting provider and the patient was admitted for further management.  Past Medical History:  Diagnosis Date  . Abnormal CT of the abdomen   . CAD (coronary artery disease)    s/p MI  . Diabetes mellitus without complication ([place])   . 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 Hypercholesterolemia   . Hypertension   . Personal history of tobacco use, presenting hazards to health 07/13/2015  . Pneumonia   . 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 COLONOSCOPY WITH PROPOFOL N/A 03/26/2015   Procedure: COLONOSCOPY WITH PROPOFOL;  Surgeon: Josefine Class, MD;  Location: [facility] ENDOSCOPY;  Service: Endoscopy;  Laterality: N/A;  . KNEE SURGERY      Family History  Problem Relation Age of Onset  . Diabetes Mellitus II Maternal Grandfather   . CAD Paternal Grandfather    Social History:  reports that he has been smoking  Cigarettes.  He has a 30.00 pack-year smoking history. He has never used smokeless tobacco. He reports that he does not drink alcohol or use drugs.  Allergies:  Allergies  Allergen Reactions  . Ibuprofen Itching  . Tylenol [Acetaminophen] Itching    Medications Prior to Admission  Medication Sig Dispense Refill  . aspirin 81 MG tablet Take 81 mg by mouth daily.    . Choline Fenofibrate (FENOFIBRIC ACID) 135 MG CPDR Take 135 mg by mouth at bedtime.    . clindamycin (CLEOCIN) 300 MG capsule Take 1 capsule (300 mg total) by mouth 4 (four) times daily. 40 capsule 0  . clopidogrel (PLAVIX) 75 MG tablet Take 75 mg by mouth daily.    Marland Kitchen glipiZIDE (GLUCOTROL XL) 5 MG 24 hr tablet Take 5 mg by mouth daily with breakfast.    . lisinopril (PRINIVIL,ZESTRIL) 2.5 MG tablet Take 2.5 mg by mouth at bedtime.     . metFORMIN (GLUCOPHAGE-XR) 500 MG 24 hr tablet Take 2,000 mg by mouth daily with breakfast.    . metoprolol (LOPRESSOR) 50 MG tablet Take 50 mg by mouth 2 (two) times daily.    . Omega-3 Fatty Acids (FISH OIL) 1000 MG CAPS Take 1,000 mg by mouth daily.    . simvastatin (ZOCOR) 80 MG tablet Take 80 mg by mouth daily.    . Vitamin D, Ergocalciferol, (DRISDOL) 50000 UNITS CAPS capsule Take 50,000 Units by mouth every 7 (seven) days.  Results for orders placed or performed during the hospital encounter of 01/29/17 (from the past 48 hour(s))  CBC with Differential     Status: Abnormal   Collection Time: 01/28/17 10:46 PM  Result Value Ref Range   WBC 12.9 (H) 3.8 - 10.6 K/uL   RBC 4.34 (L) 4.40 - 5.90 MIL/uL   Hemoglobin 14.1 13.0 - 18.0 g/dL   HCT 40.3 40.0 - 52.0 %   MCV 93.0 80.0 - 100.0 fL   MCH 32.4 26.0 - 34.0 pg   MCHC 34.9 32.0 - 36.0 g/dL   RDW 13.7 11.5 - 14.5 %   Platelets 162 150 - 440 K/uL   Neutrophils Relative % 72 %   Neutro Abs 9.4 (H) 1.4 - 6.5 K/uL   Lymphocytes Relative 18 %   Lymphs Abs 2.4 1.0 - 3.6 K/uL   Monocytes Relative 6 %   Monocytes Absolute 0.7 0.2 -  1.0 K/uL   Eosinophils Relative 3 %   Eosinophils Absolute 0.4 0 - 0.7 K/uL   Basophils Relative 1 %   Basophils Absolute 0.1 0 - 0.1 K/uL  Basic metabolic panel     Status: Abnormal   Collection Time: 01/28/17 10:46 PM  Result Value Ref Range   Sodium 134 (L) 135 - 145 mmol/L   Potassium 3.8 3.5 - 5.1 mmol/L   Chloride 99 (L) 101 - 111 mmol/L   CO2 25 22 - 32 mmol/L   Glucose, Bld 103 (H) 65 - 99 mg/dL   BUN 18 6 - 20 mg/dL   Creatinine, Ser 1.04 0.61 - 1.24 mg/dL   Calcium 9.4 8.9 - 10.3 mg/dL   GFR calc non Af Amer >60 >60 mL/min   GFR calc Af Amer >60 >60 mL/min    Comment: (NOTE) The eGFR has been calculated using the CKD EPI equation. This calculation has not been validated in all clinical situations. eGFR's persistently <60 mL/min signify possible Chronic Kidney Disease.    Anion gap 10 5 - 15  TSH     Status: Abnormal   Collection Time: 01/28/17 10:46 PM  Result Value Ref Range   TSH 5.370 (H) 0.350 - 4.500 uIU/mL    Comment: Performed by a 3rd Generation assay with a functional sensitivity of <=0.01 uIU/mL.   No results found.  Review of Systems  Constitutional: Positive for chills. Negative for fever.  HENT: Negative for sore throat and tinnitus.   Eyes: Negative for blurred vision and redness.  Respiratory: Negative for cough and shortness of breath.   Cardiovascular: Negative for chest pain, palpitations, orthopnea and PND.  Gastrointestinal: Negative for abdominal pain, diarrhea, nausea and vomiting.  Genitourinary: Negative for dysuria, frequency and urgency.  Musculoskeletal: Positive for falls and joint pain. Negative for myalgias.  Skin: Negative for rash.       No lesions  Neurological: Negative for speech change, focal weakness and weakness.  Endo/Heme/Allergies: Does not bruise/bleed easily.       No temperature intolerance  Psychiatric/Behavioral: Negative for depression and suicidal ideas.    Blood pressure (!) 150/68, pulse 77, temperature 98.5  F (36.9 C), temperature source Oral, resp. rate 18, weight 118 kg (260 lb 1.6 oz), SpO2 100 %. Physical Exam  Nursing note and vitals reviewed. Constitutional: He is oriented to person, place, and time. He appears well-developed and well-nourished. No distress.  HENT:  Head: Normocephalic and atraumatic.  Mouth/Throat: Oropharynx is clear and moist.  Eyes: Conjunctivae and EOM are normal. Pupils are equal, round, and  reactive to light. No scleral icterus.  Neck: Normal range of motion. Neck supple. No JVD present. No tracheal deviation present. No thyromegaly present.  Cardiovascular: Normal rate, regular rhythm and normal heart sounds.  Exam reveals no gallop and no friction rub.   No murmur heard. Respiratory: Effort normal and breath sounds normal. No respiratory distress.  GI: Soft. Bowel sounds are normal. He exhibits no distension.  Genitourinary:  Genitourinary Comments: Deferred  Musculoskeletal: Normal range of motion. He exhibits no edema.       Legs: Erythema in black; abrasion in blue; area of fluctuance in red  Lymphadenopathy:    He has no cervical adenopathy.  Neurological: He is alert and oriented to person, place, and time. No cranial nerve deficit.  Skin: Skin is warm and dry. No rash noted. No erythema.  Psychiatric: He has a normal mood and affect. His behavior is normal. Judgment and thought content normal.     Assessment/Plan This is a 66 year old male admitted for cellulitis of the right knee area 1. Cellulitis: Purulent but without supporting data for sepsis. The patient has had subjective fevers and chills however. He has received 1 dose of vancomycin and Zosyn and I find it reasonable for him to receive another prior to transition to oral antibiotics. Incision and drainage yielded small amount of pus and approximately 5 mL of blood. Bactrim or doxycycline to follow IV antibiotics. 2. CAD: Stable; continue aspirin and Plavix 3. Hypertension: Less than optimal  control; continue metoprolol. Consider increasing dose of lisinopril 4. Diabetes mellitus type 2: Hold oral hyperglycemic agents. Sliding scale insulin while hospitalized. 5. Hyperlipidemia: Continue statin therapy and fenofibrate 6. DVT prophylaxis: Lovenox 7. GI prophylaxis: None The patient is a full code. Time spent on admission orders and patient care approximately 45 minutes  Harrie Foreman, MD 01/29/2017, 7:29 AM

## 2017-01-29 NOTE — ED Notes (Signed)
Pt transport [id number]

## 2017-01-29 NOTE — Consult Note (Signed)
ORTHOPAEDIC CONSULTATION  REQUESTING PHYSICIAN: Henreitta Leber, MD  Chief Complaint:    HPI: Kevin Ryan is a 66 y.o. male who complains of     Past Medical History:  Diagnosis Date  . Abnormal CT of the abdomen   . CAD (coronary artery disease)    s/p MI  . Diabetes mellitus without complication ([place])   . 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 Hypercholesterolemia   . Hypertension   . Personal history of tobacco use, presenting hazards to health 07/13/2015  . Pneumonia   . 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 COLONOSCOPY WITH PROPOFOL N/A 03/26/2015   Procedure: COLONOSCOPY WITH PROPOFOL;  Surgeon: Josefine Class, MD;  Location: [facility] ENDOSCOPY;  Service: Endoscopy;  Laterality: N/A;  . KNEE SURGERY     Social History   Social History  . Marital status: Married    Spouse name: N/A  . Number of children: N/A  . Years of education: N/A   Social History Main Topics  . Smoking status: Current Every Day Smoker    Packs/day: 1.00    Years: 30.00    Types: Cigarettes  . Smokeless tobacco: Never Used  . Alcohol use No  . Drug use: No  . Sexual activity: Yes    Birth control/ protection: None   Other Topics Concern  . None   Social History Narrative  . None   Family History  Problem Relation Age of Onset  . Diabetes Mellitus II Maternal Grandfather   . CAD Paternal Grandfather    Allergies  Allergen Reactions  . Ibuprofen Itching  . Tylenol [Acetaminophen] Itching   Prior to Admission medications   Medication Sig Start Date End Date Taking? Authorizing Provider  aspirin 81 MG tablet Take 81 mg by mouth daily.   Yes [provider]  Choline Fenofibrate (FENOFIBRIC ACID) 135 MG CPDR Take 135 mg by mouth at bedtime.   Yes [provider]  clindamycin (CLEOCIN) 300 MG capsule Take 1 capsule (300 mg total) by mouth 4 (four) times daily. 01/26/17  Yes Menshew, Dannielle Karvonen, PA-C   clopidogrel (PLAVIX) 75 MG tablet Take 75 mg by mouth daily.   Yes [provider]  glipiZIDE (GLUCOTROL XL) 5 MG 24 hr tablet Take 5 mg by mouth daily with breakfast.   Yes [provider]  lisinopril (PRINIVIL,ZESTRIL) 2.5 MG tablet Take 2.5 mg by mouth at bedtime.    Yes [provider]  metFORMIN (GLUCOPHAGE-XR) 500 MG 24 hr tablet Take 2,000 mg by mouth daily with breakfast.   Yes [provider]  metoprolol (LOPRESSOR) 50 MG tablet Take 50 mg by mouth 2 (two) times daily.   Yes [provider]  Omega-3 Fatty Acids (FISH OIL) 1000 MG CAPS Take 1,000 mg by mouth daily.   Yes [provider]  simvastatin (ZOCOR) 80 MG tablet Take 80 mg by mouth daily.   Yes [provider]  Vitamin D, Ergocalciferol, (DRISDOL) 50000 UNITS CAPS capsule Take 50,000 Units by mouth every 7 (seven) days.   Yes [provider]   No results found.  Positive ROS: All other systems have been reviewed and were otherwise negative with the exception of those mentioned in the HPI and as above.  Physical Exam: General: Alert, no acute distress Cardiovascular: No pedal edema Respiratory: No cyanosis, no use of accessory musculature GI: No organomegaly, abdomen is soft and non-tender Skin:  No lesions in the area of chief complaint Neurologic: Sensation intact distally Psychiatric: Patient is competent for consent with normal mood and affect Lymphatic: No axillary or cervical lymphadenopathy  MUSCULOSKELETAL:    Assessment:    Plan:       Park Breed, MD [phone]   01/29/2017 1:48 PM

## 2017-01-29 NOTE — Evaluation (Signed)
Physical Therapy Evaluation Patient Details Name: Kevin Ryan MRN: [id number] DOB: 1951/05/20 Today's Date: 01/29/2017   History of Present Illness  Kevin Ryan is a 66yo white male who comes to [facility] on 7/1 after worsening pain in Rt knee wound and resultant fall in shower onto back. He was in the ED 3 days prior on 6/29 and sent home with oral ABX. Pt reports significant reduction in pain after aspiration of wound in ED this visit. 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 Pt reports he hurt his knee inititally while working on a lawnmower, suspecting that he scraped it on something.   Clinical Impression  Pt admitted with above diagnosis. Pt currently with functional limitations due to the deficits listed below (see "PT Problem List"). Pt demonstrating all mobility with minimal restrictions and limitations due to pain in Rt knee wound, however, pt performing all safely and independently. Recommend medical management of wound and continued physical activity as tolerated. No Skilled PT needs at this time. Pt would benefit from AFO (ankle foot orthosis) for Right foot drop, a chronic progressive issue deemed non-operative by neurosurgery 7+YA. PT signing off.     Follow Up Recommendations No PT follow up    Equipment Recommendations   (AFO; can be fitted in Outpatient. )    Recommendations for Other Services  (Prosthetist for Rt AFO )     Precautions / Restrictions Precautions Precautions: None Precaution Comments: ISO       Mobility  Bed Mobility Overal bed mobility: Independent                Transfers Overall transfer level: Independent                  Ambulation/Gait Ambulation/Gait assistance: Independent Ambulation Distance (Feet): 200 Feet Assistive device: None   Gait velocity: 0.70ms  Gait  velocity interpretation: Below normal speed for age/gender    Stairs            Wheelchair Mobility    Modified Rankin (Stroke Patients Only)       Balance Overall balance assessment: Independent                                           Pertinent Vitals/Pain Pain Assessment: No/denies pain (0/10 resting; 2/10 in weight bearing for AMB. )    Home Living Family/patient expects to be discharged to:: Private residence Living Arrangements: Spouse/significant other Available Help at Discharge: Family Type of Home: House Home Access: Stairs to enter Entrance Stairs-Rails: Right Entrance Stairs-Number of Steps: 5   Home Equipment: None      Prior Function Level of Independence: Independent               Hand Dominance        Extremity/Trunk Assessment   Upper Extremity Assessment Upper Extremity Assessment: Overall WFL for tasks assessed    Lower Extremity Assessment Lower Extremity Assessment: Overall WFL for tasks assessed;RLE deficits/detail RLE Deficits / Details: Right foot drop, chronic; Lumbar spine related     Cervical / Trunk Assessment Cervical / Trunk Assessment: Normal  Communication   Communication: No difficulties  Cognition Arousal/Alertness: Awake/alert Behavior During Therapy: WFL for tasks assessed/performed Overall Cognitive Status: Within  Functional Limits for tasks assessed                                        General Comments      Exercises     Assessment/Plan    PT Assessment All further PT needs can be met in the next venue of care  PT Problem List Decreased activity tolerance       PT Treatment Interventions      PT Goals (Current goals can be found in the Care Plan section)  Acute Rehab PT Goals PT Goal Formulation: All assessment and education complete, DC therapy    Frequency     Barriers to discharge        Co-evaluation               AM-PAC PT "6 Clicks"  Daily Activity  Outcome Measure Difficulty turning over in bed (including adjusting bedclothes, sheets and blankets)?: None Difficulty moving from lying on back to sitting on the side of the bed? : None Difficulty sitting down on and standing up from a chair with arms (e.g., wheelchair, bedside commode, etc,.)?: None Help needed moving to and from a bed to chair (including a wheelchair)?: None Help needed walking in hospital room?: A Little Help needed climbing 3-5 steps with a railing? : A Little 6 Click Score: 22    End of Session Equipment Utilized During Treatment: Gait belt Activity Tolerance: Patient tolerated treatment well;Patient limited by pain (knee) Patient left: in bed;with call bell/phone within reach;with nursing/sitter in room Nurse Communication: Mobility status;Other (comment) PT Visit Diagnosis: Pain Pain - Right/Left: Right Pain - part of body: Knee    Time: 1140-1200 PT Time Calculation (min) (ACUTE ONLY): 20 min   Charges:   PT Evaluation $PT Eval Low Complexity: 1 Procedure     PT G Codes:   PT G-Codes **NOT FOR INPATIENT CLASS** Functional Assessment Tool Used: AM-PAC 6 Clicks Basic Mobility Functional Limitation: Mobility: Walking and moving around Mobility: Walking and Moving Around Current Status (K2706): At least 20 percent but less than 40 percent impaired, limited or restricted Mobility: Walking and Moving Around Goal Status [phone]): At least 20 percent but less than 40 percent impaired, limited or restricted Mobility: Walking and Moving Around Discharge Status [phone]): At least 20 percent but less than 40 percent impaired, limited or restricted    12:47 PM, 01/29/17 Etta Grandchild, PT, DPT Physical Therapist - [facility])  [phone] (mobile)   Saya Mccoll C 01/29/2017, 12:42 PM

## 2017-01-29 NOTE — Progress Notes (Signed)
Pharmacy Antibiotic Note  SIRE POET is a 66 y.o. male admitted on 01/29/2017 with cellulitis.  Pharmacy has been consulted for vancomycin and Zosyn dosing.  Plan: DW 92kg  Vd 64L kei 0.079 hr-1  T1/2 9 hours Vancomycin 1250 mg q 12 hours ordered with stacked dosing. Level before 5th dose. Goal trough 10-15.  Zosyn 3.375 grams q 8 hours ordered.     Temp (24hrs), Avg:99.1 F (37.3 C), Min:99.1 F (37.3 C), Max:99.1 F (37.3 C)   Recent Labs Lab 01/25/17 2251 01/28/17 2246  WBC 12.3* 12.9*  CREATININE 1.18 1.04    Estimated Creatinine Clearance: 90.4 mL/min (by C-G formula based on SCr of 1.04 mg/dL).    Allergies  Allergen Reactions  . Ibuprofen Itching  . Tylenol [Acetaminophen] Itching    Antimicrobials this admission: vancomycin Zosyn 7/2 >>    >>   Dose adjustments this admission:   Microbiology results: No micro  Thank you for allowing pharmacy to be a part of this patient's care.  Amit Meloy S 01/29/2017 3:46 AM

## 2017-01-29 NOTE — Progress Notes (Signed)
[facility] at [place] NAME: Kevin Ryan    MR#:  [id number]  DATE OF BIRTH:  [birth year]  SUBJECTIVE:   Pt. Here due to right knee pain/swelling.  Had a fall last week And was having trouble ambulating and noted to have some right knee swelling redness and pain.  REVIEW OF SYSTEMS:    Review of Systems  Constitutional: Negative for chills and fever.  HENT: Negative for congestion and tinnitus.   Eyes: Negative for blurred vision and double vision.  Respiratory: Negative for cough, shortness of breath and wheezing.   Cardiovascular: Negative for chest pain, orthopnea and PND.  Gastrointestinal: Negative for abdominal pain, diarrhea, nausea and vomiting.  Genitourinary: Negative for dysuria and hematuria.  Musculoskeletal: Positive for joint pain (Right knee).  Neurological: Negative for dizziness, sensory change and focal weakness.  All other systems reviewed and are negative.   Nutrition: Heart Healthy/Carb modified Tolerating Diet: Yes Tolerating PT: Await Eval.    DRUG ALLERGIES:   Allergies  Allergen Reactions  . Ibuprofen Itching  . Tylenol [Acetaminophen] Itching    VITALS:  Blood pressure 128/62, pulse 67, temperature 97.8 F (36.6 C), temperature source Oral, resp. rate 18, height 6\' 1"  (1.854 m), weight 118 kg (260 lb 1.6 oz), SpO2 98 %.  PHYSICAL EXAMINATION:   Physical Exam  GENERAL:  66 y.o.-year-old patient lying in bed in no acute distress.  EYES: Pupils equal, round, reactive to light and accommodation. No scleral icterus. Extraocular muscles intact.  HEENT: Head atraumatic, normocephalic. Oropharynx and nasopharynx clear.  NECK:  Supple, no jugular venous distention. No thyroid enlargement, no tenderness.  LUNGS: Normal breath sounds bilaterally, no wheezing, rales, rhonchi. No use of accessory muscles of respiration.  CARDIOVASCULAR: S1, S2 normal. No murmurs, rubs, or gallops.  ABDOMEN: Soft, nontender,  nondistended. Bowel sounds present. No organomegaly or mass.  EXTREMITIES: No cyanosis, clubbing or edema b/l.   Right knee swelling with redness and induration. No drainage noted. NEUROLOGIC: Cranial nerves II through XII are intact. No focal Motor or sensory deficits b/l.   PSYCHIATRIC: The patient is alert and oriented x 3.  SKIN: No obvious rash, lesion, or ulcer.    LABORATORY PANEL:   CBC  Recent Labs Lab 01/28/17 2246  WBC 12.9*  HGB 14.1  HCT 40.3  PLT 162   ------------------------------------------------------------------------------------------------------------------  Chemistries   Recent Labs Lab 01/28/17 2246  NA 134*  K 3.8  CL 99*  CO2 25  GLUCOSE 103*  BUN 18  CREATININE 1.04  CALCIUM 9.4   ------------------------------------------------------------------------------------------------------------------  Cardiac Enzymes No results for input(s): TROPONINI in the last 168 hours. ------------------------------------------------------------------------------------------------------------------  RADIOLOGY:  No results found.   ASSESSMENT AND PLAN:   65 year old male with past medical history of DM, HTN, Hyperlipidemia, CAD, OA, hx of Knee replacements who presently to the hospital due to right knee swelling, pain, redness.   1. Right knee pain/swelling - ?? Cellulitis (vs) Septic Arthritis.  - cont. Empiric Vanco, Zosyn for now.  Will get ortho consult for possible I & D and arthrocentesis.  - follow cultures which are (-).   2. Leukocytosis - due to # 1.  - cont. IV Abx and follow WBC count.   3. HTN - cont. Lisinopril, Metoprolol.   4. Hyperlipidemia - cont. Atorvastatin, Fenofibrate   5. DM Type II w/out complication - cont. SSI.     All the records are reviewed and case discussed with Care Management/Social Worker. Management plans discussed  with the patient, family and they are in agreement.  CODE STATUS: Full code  DVT  Prophylaxis: Lovenox  TOTAL TIME TAKING CARE OF THIS PATIENT: 30 minutes.   POSSIBLE D/C IN 1-2 DAYS, DEPENDING ON CLINICAL CONDITION.   Henreitta Leber M.D on 01/29/2017 at 1:22 PM  Between 7am to 6pm - Pager - [phone]  After 6pm go to [email or web address] - [facility]  Office  [phone]  CC: Primary care physician; Center, [facility]

## 2017-01-30 LAB — CBC
HCT: 41.2 % (ref 40.0–52.0)
Hemoglobin: 14.2 g/dL (ref 13.0–18.0)
MCH: 32.9 pg (ref 26.0–34.0)
MCHC: 34.5 g/dL (ref 32.0–36.0)
MCV: 95.2 fL (ref 80.0–100.0)
Platelets: 177 10*3/uL (ref 150–440)
RBC: 4.32 MIL/uL — ABNORMAL LOW (ref 4.40–5.90)
RDW: 13.9 % (ref 11.5–14.5)
WBC: 9.8 10*3/uL (ref 3.8–10.6)

## 2017-01-30 LAB — GLUCOSE, CAPILLARY
Glucose-Capillary: 151 mg/dL — ABNORMAL HIGH (ref 65–99)
Glucose-Capillary: 137 mg/dL — ABNORMAL HIGH (ref 65–99)

## 2017-01-30 LAB — HEMOGLOBIN A1C
Hgb A1c MFr Bld: 7.6 % — ABNORMAL HIGH (ref 4.8–5.6)
Mean Plasma Glucose: 171 mg/dL

## 2017-01-30 LAB — CREATININE, SERUM
Creatinine, Ser: 1.04 mg/dL (ref 0.61–1.24)
GFR calc Af Amer: >60 mL/min (ref >60)
GFR calc non Af Amer: >60 mL/min (ref >60)

## 2017-01-30 MED ORDER — CEPHALEXIN 500 MG PO CAPS: 500.0000 mg | ORAL_CAPSULE | Freq: Two times a day (BID) | ORAL | 0 refills | Status: AC

## 2017-01-30 MED ORDER — SULFAMETHOXAZOLE-TRIMETHOPRIM 800-160 MG PO TABS: 1.0000 | ORAL_TABLET | Freq: Two times a day (BID) | ORAL | 0 refills | Status: AC

## 2017-01-30 NOTE — Discharge Instructions (Signed)

## 2017-01-30 NOTE — Progress Notes (Signed)
Pnt admit with right knee pain and swelling. Pnt denied pain to knee however he does report being "sore". Offered to call-on call MD for medication, pnt declined. Pnt used heating pad intermittently.  Pnt resting now and appears comfortable. Bed low and locked, call bell in reach. Will continue to monitor and assess.

## 2017-02-01 NOTE — Discharge Summary (Signed)
[place] at [place] NAME: Kevin Ryan    MR#:  [id number]  DATE OF BIRTH:  August 12, 1950  DATE OF ADMISSION:  01/29/2017 ADMITTING PHYSICIAN: Harrie Foreman, MD  DATE OF DISCHARGE: 01/30/2017 11:15 AM  PRIMARY CARE PHYSICIAN: [facility], [place] DIAGNOSIS:  Cellulitis of right lower extremity [O37.858]  DISCHARGE DIAGNOSIS:  Active Problems:   Cellulitis   SECONDARY DIAGNOSIS:   Past Medical History:  Diagnosis Date  . Abnormal CT of the abdomen   . CAD (coronary artery disease)    s/p MI  . Diabetes mellitus without complication ([place])   . 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 Hypercholesterolemia   . Hypertension   . Personal history of tobacco use, presenting hazards to health 07/13/2015  . Pneumonia   . 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   1. Right knee pain/swelling - Patient was admitted to the hospital and started on broad-spectrum IV antibiotics to cover for cellulitis/septic arthritis. Empirically patient was placed on IV vancomycin, Zosyn. An orthopedic consult was obtained. They did not think that the patient had septic arthritis. -After IV antibiotics patient's clinical symptoms have significant improved. His swelling has reduced. -He is being discharged on oral Bactrim and Keflex for additional 10 days with follow-up with his primary care as an outpatient. His cultures have remained negative.  2. Leukocytosis - due to # 1.  This improved and normalized with IV antibiotics.  3. HTN - pt. Will cont. Lisinopril, Metoprolol.   4. Hyperlipidemia - pt. Will cont. Atorvastatin, Fenofibrate   5. DM Type II w/out complication - while in the hospital patient was on sliding scale insulin, he will  continue his metformin, glipizide upon discharge.  DISCHARGE CONDITIONS:   Stable  CONSULTS OBTAINED:    DRUG ALLERGIES:   Allergies  Allergen Reactions  . Ibuprofen Itching  . Tylenol [Acetaminophen] Itching    DISCHARGE MEDICATIONS:   Allergies as of 01/30/2017      Reactions   Ibuprofen Itching   Tylenol [acetaminophen] Itching      Medication List    STOP taking these medications   clindamycin 300 MG capsule Commonly known as:  CLEOCIN     TAKE these medications   aspirin 81 MG tablet Take 81 mg by mouth daily.   cephALEXin 500 MG capsule Commonly known as:  KEFLEX Take 1 capsule (500 mg total) by mouth 2 (two) times daily.   clopidogrel 75 MG tablet Commonly known as:  PLAVIX Take 75 mg by mouth daily.   Fenofibric Acid 135 MG Cpdr Take 135 mg by mouth at bedtime.   Fish Oil 1000 MG Caps Take 1,000 mg by mouth daily.   glipiZIDE 5 MG 24 hr tablet Commonly known as:  GLUCOTROL XL Take 5 mg by mouth daily with breakfast.   lisinopril 2.5 MG tablet Commonly known as:  PRINIVIL,ZESTRIL Take 2.5 mg by mouth at bedtime.   metFORMIN 500 MG 24 hr tablet Commonly known as:  GLUCOPHAGE-XR Take 2,000 mg by mouth daily with breakfast.   metoprolol tartrate 50 MG tablet Commonly known as:  LOPRESSOR Take 50 mg by mouth 2 (two) times daily.   simvastatin 80 MG tablet Commonly known as:  ZOCOR Take 80  mg by mouth daily.   sulfamethoxazole-trimethoprim 800-160 MG tablet Commonly known as:  BACTRIM DS,SEPTRA DS Take 1 tablet by mouth 2 (two) times daily.   Vitamin D (Ergocalciferol) 50000 units Caps capsule Commonly known as:  DRISDOL Take 50,000 Units by mouth every 7 (seven) days.         DISCHARGE INSTRUCTIONS:   DIET:  Cardiac diet and Diabetic diet  DISCHARGE CONDITION:  Stable  ACTIVITY:  Activity as tolerated  OXYGEN:  Home Oxygen: No.   Oxygen Delivery: room air  DISCHARGE LOCATION:  home   If you experience worsening of  your admission symptoms, develop shortness of breath, life threatening emergency, suicidal or homicidal thoughts you must seek medical attention immediately by calling 911 or calling your MD immediately  if symptoms less severe.  You Must read complete instructions/literature along with all the possible adverse reactions/side effects for all the Medicines you take and that have been prescribed to you. Take any new Medicines after you have completely understood and accpet all the possible adverse reactions/side effects.   Please note  You were cared for by a hospitalist during your hospital stay. If you have any questions about your discharge medications or the care you received while you were in the hospital after you are discharged, you can call the unit and asked to speak with the hospitalist on call if the hospitalist that took care of you is not available. Once you are discharged, your primary care physician will handle any further medical issues. Please note that NO REFILLS for any discharge medications will be authorized once you are discharged, as it is imperative that you return to your primary care physician (or establish a relationship with a primary care physician if you do not have one) for your aftercare needs so that they can reassess your need for medications and monitor your lab values.     Today   Right knee swelling and redness has improved. Range of motion is also improved.  VITAL SIGNS:  Blood pressure (!) 125/51, pulse 64, temperature 98.8 F (37.1 C), temperature source Oral, resp. rate 20, height 6\' 1"  (1.854 m), weight 118 kg (260 lb 1.6 oz), SpO2 98 %.  I/O:  No intake or output data in the 24 hours ending 02/01/17 1527  PHYSICAL EXAMINATION:   GENERAL:  66 y.o.-year-old patient lying in bed in no acute distress.  EYES: Pupils equal, round, reactive to light and accommodation. No scleral icterus. Extraocular muscles intact.  HEENT: Head atraumatic, normocephalic.  Oropharynx and nasopharynx clear.  NECK:  Supple, no jugular venous distention. No thyroid enlargement, no tenderness.  LUNGS: Normal breath sounds bilaterally, no wheezing, rales, rhonchi. No use of accessory muscles of respiration.  CARDIOVASCULAR: S1, S2 normal. No murmurs, rubs, or gallops.  ABDOMEN: Soft, nontender, nondistended. Bowel sounds present. No organomegaly or mass.  EXTREMITIES: No cyanosis, clubbing or edema b/l.   Right knee swelling with redness and induration improved. NEUROLOGIC: Cranial nerves II through XII are intact. No focal Motor or sensory deficits b/l.   PSYCHIATRIC: The patient is alert and oriented x 3.  SKIN: No obvious rash, lesion, or ulcer.    DATA REVIEW:   CBC  Recent Labs Lab 01/30/17 0409  WBC 9.8  HGB 14.2  HCT 41.2  PLT 177    Chemistries   Recent Labs Lab 01/28/17 2246 01/30/17 0409  NA 134*  --   K 3.8  --   CL 99*  --   CO2 25  --  GLUCOSE 103*  --   BUN 18  --   CREATININE 1.04 1.04  CALCIUM 9.4  --     Cardiac Enzymes No results for input(s): TROPONINI in the last 168 hours.  Microbiology Results  Results for orders placed or performed during the hospital encounter of 01/29/17  Aerobic/Anaerobic Culture (surgical/deep wound)     Status: None (Preliminary result)   Collection Time: 01/29/17  4:14 AM  Result Value Ref Range Status   Specimen Description WOUND  Final   Special Requests KNEE  Final   Gram Stain   Final    FEW WBC PRESENT,BOTH PMN AND MONONUCLEAR RARE GRAM POSITIVE COCCI IN PAIRS Performed at [facility], [place] [address], [name], [place] [address]    Culture   Final    FEW STAPHYLOCOCCUS AUREUS SUSCEPTIBILITIES TO FOLLOW NO ANAEROBES ISOLATED; CULTURE IN PROGRESS FOR 5 DAYS    Report Status PENDING  Incomplete  MRSA PCR Screening     Status: Abnormal   Collection Time: 01/29/17  6:10 AM  Result Value Ref Range Status   MRSA by PCR POSITIVE (A) NEGATIVE Final    Comment:        The  GeneXpert MRSA Assay (FDA approved for NASAL specimens only), is one component of a comprehensive MRSA colonization surveillance program. It is not intended to diagnose MRSA infection nor to guide or monitor treatment for MRSA infections. RESULT CALLED TO, READ BACK BY AND VERIFIED WITH:  ANNA RODRIQUEZ AT 0801 01/29/17 SDR     RADIOLOGY:  No results found.    Management plans discussed with the patient, family and they are in agreement.  CODE STATUS:  Code Status History    Date Active Date Inactive Code Status Order ID Comments User Context   01/29/2017  5:15 AM 01/30/2017  2:20 PM Full Code [id number]  Harrie Foreman, MD Inpatient      TOTAL TIME TAKING CARE OF THIS PATIENT: 40 minutes.    Henreitta Leber M.D on 02/01/2017 at 3:27 PM  Between 7am to 6pm - Pager - [phone]  After 6pm go to [email or web address] - [facility]  Office  [phone]  CC: Primary care physician; Center, [facility]

## 2017-02-03 LAB — AEROBIC/ANAEROBIC CULTURE (SURGICAL/DEEP WOUND)

## 2017-02-03 LAB — AEROBIC/ANAEROBIC CULTURE W GRAM STAIN (SURGICAL/DEEP WOUND)

## 2017-04-11 ENCOUNTER — Emergency Department (HOSPITAL_COMMUNITY)
Admission: EM | Admit: 2017-04-11 | Discharge: 2017-04-12 | Disposition: A | Discharge status: Home / Self Care | Payer: Commercial Managed Care - PPO | Attending: Emergency Medicine | Admitting: Emergency Medicine

## 2017-04-11 ENCOUNTER — Emergency Department (HOSPITAL_COMMUNITY): Payer: Commercial Managed Care - PPO

## 2017-04-11 DIAGNOSIS — E7800 Pure hypercholesterolemia, unspecified: Secondary | ICD-10-CM | POA: Insufficient documentation

## 2017-04-11 DIAGNOSIS — Y92410 Unspecified street and highway as the place of occurrence of the external cause: Secondary | ICD-10-CM | POA: Insufficient documentation

## 2017-04-11 DIAGNOSIS — T148XXA Other injury of unspecified body region, initial encounter: Secondary | ICD-10-CM | POA: Insufficient documentation

## 2017-04-11 DIAGNOSIS — Y998 Other external cause status: Secondary | ICD-10-CM | POA: Diagnosis not present

## 2017-04-11 DIAGNOSIS — E119 Type 2 diabetes mellitus without complications: Secondary | ICD-10-CM | POA: Insufficient documentation

## 2017-04-11 DIAGNOSIS — Z7982 Long term (current) use of aspirin: Secondary | ICD-10-CM | POA: Diagnosis not present

## 2017-04-11 DIAGNOSIS — T07XXXA Unspecified multiple injuries, initial encounter: Secondary | ICD-10-CM

## 2017-04-11 DIAGNOSIS — Z7984 Long term (current) use of oral hypoglycemic drugs: Secondary | ICD-10-CM | POA: Insufficient documentation

## 2017-04-11 DIAGNOSIS — I2510 Atherosclerotic heart disease of native coronary artery without angina pectoris: Secondary | ICD-10-CM | POA: Diagnosis not present

## 2017-04-11 DIAGNOSIS — S81812A Laceration without foreign body, left lower leg, initial encounter: Secondary | ICD-10-CM | POA: Diagnosis present

## 2017-04-11 DIAGNOSIS — Y939 Activity, unspecified: Secondary | ICD-10-CM | POA: Diagnosis not present

## 2017-04-11 DIAGNOSIS — Z79899 Other long term (current) drug therapy: Secondary | ICD-10-CM | POA: Insufficient documentation

## 2017-04-11 DIAGNOSIS — I10 Essential (primary) hypertension: Secondary | ICD-10-CM | POA: Insufficient documentation

## 2017-04-11 DIAGNOSIS — F17210 Nicotine dependence, cigarettes, uncomplicated: Secondary | ICD-10-CM | POA: Diagnosis not present

## 2017-04-11 MED ORDER — ONDANSETRON 4 MG PO TBDP
8.0000 mg | ORAL_TABLET | Freq: Once | ORAL | Status: AC
  Administered 2017-04-11: 8 mg via ORAL
  Filled 2017-04-11: qty 2

## 2017-04-11 MED ORDER — TRAMADOL HCL 50 MG PO TABS
50.0000 mg | ORAL_TABLET | Freq: Once | ORAL | Status: AC
  Administered 2017-04-11: 50 mg via ORAL
  Filled 2017-04-11: qty 1

## 2017-04-11 MED ORDER — LIDOCAINE-EPINEPHRINE (PF) 2 %-1:200000 IJ SOLN
10.0000 mL | Freq: Once | INTRAMUSCULAR | Status: AC
  Administered 2017-04-11: 10 mL
  Filled 2017-04-11: qty 20
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
## 2017-04-11 NOTE — ED Provider Notes (Signed)
..Laceration Repair Date/Time: 04/11/2017 11:00 PM Performed by: Abigail Butts Authorized by: Abigail Butts   Consent:    Consent obtained:  Verbal   Consent given by:  Patient   Risks discussed:  Infection, pain, poor cosmetic result and poor wound healing   Alternatives discussed:  No treatment Anesthesia (see MAR for exact dosages):    Anesthesia method:  Local infiltration   Local anesthetic:  Lidocaine 2% WITH epi (4mL) Laceration details:    Location:  Leg   Leg location:  L lower leg   Length (cm):  9 Repair type:    Repair type:  Simple Pre-procedure details:    Preparation:  Patient was prepped and draped in usual sterile fashion Exploration:    Hemostasis achieved with:  Epinephrine and direct pressure   Wound exploration: entire depth of wound probed and visualized   Treatment:    Area cleansed with:  Saline   Amount of cleaning:  Standard   Irrigation solution:  Sterile water   Irrigation volume:  538mL   Irrigation method:  Syringe Skin repair:    Repair method:  Sutures   Suture size:  4-0   Suture material:  Prolene   Suture technique:  Simple interrupted   Number of sutures:  10 Approximation:    Approximation:  Close   Vermilion border: well-aligned   Post-procedure details:    Dressing:  Tube gauze and non-adherent dressing   Patient tolerance of procedure:  Tolerated well, no immediate complications Comments:     Pt with nausea, pallor, diaphoresis and hypotension mid procedure.  Pt reports hx of vagal response.  Symptoms resolved after zofran, position change and cool cloth.  No syncope or LOC.    Marland Kitchen.Laceration Repair Date/Time: 04/12/2017 12:04 AM Performed by: Abigail Butts Authorized by: Abigail Butts   Consent:    Consent obtained:  Verbal   Consent given by:  Patient   Risks discussed:  Infection, pain, poor cosmetic result and poor wound healing   Alternatives discussed:  No treatment Anesthesia (see MAR for  exact dosages):    Anesthesia method:  Local infiltration   Local anesthetic:  Lidocaine 2% WITH epi (68mL) Laceration details:    Location:  Leg   Leg location:  L lower leg   Length (cm):  3 Repair type:    Repair type:  Simple Pre-procedure details:    Preparation:  Patient was prepped and draped in usual sterile fashion and imaging obtained to evaluate for foreign bodies Exploration:    Hemostasis achieved with:  Epinephrine and direct pressure   Wound exploration: entire depth of wound probed and visualized   Treatment:    Area cleansed with:  Saline   Amount of cleaning:  Standard   Irrigation solution:  Sterile water   Irrigation volume:  557mL   Irrigation method:  Syringe Skin repair:    Repair method:  Sutures   Suture size:  4-0   Suture material:  Prolene   Suture technique:  Simple interrupted   Number of sutures:  4 Approximation:    Approximation:  Close   Vermilion border: well-aligned   Post-procedure details:    Dressing:  Open (no dressing)   Patient tolerance of procedure:  Tolerated well, no immediate complications Comments:     Pt with nausea, pallor, diaphoresis and hypotension mid procedure.  Pt reports hx of vagal response.  Symptoms resolved after zofran, position change and cool cloth.  No syncope or LOC.  Brande Uncapher, Gwenlyn Perking 04/12/17 0007    Virgel Manifold, MD 04/22/17 [phone]
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
## 2017-04-11 NOTE — ED Notes (Signed)
Pt became very pale, diaphoretic and nauseous when PA started to suture pt. Vitals taken, Zofran ODT given.

## 2017-04-11 NOTE — ED Notes (Signed)
Patient transported to X-ray 

## 2017-04-12 MED ORDER — TRAMADOL HCL 50 MG PO TABS: 50.0000 mg | ORAL_TABLET | Freq: Four times a day (QID) | ORAL | 0 refills | Status: DC | PRN

## 2017-11-21 DIAGNOSIS — M4712 Other spondylosis with myelopathy, cervical region: Secondary | ICD-10-CM | POA: Insufficient documentation

## 2017-11-21 DIAGNOSIS — M48061 Spinal stenosis, lumbar region without neurogenic claudication: Secondary | ICD-10-CM | POA: Insufficient documentation

## 2017-11-21 HISTORY — DX: Other spondylosis with myelopathy, cervical region: M47.12

## 2017-11-21 HISTORY — DX: Spinal stenosis, lumbar region without neurogenic claudication: M48.061

## 2018-01-23 DIAGNOSIS — Z79899 Other long term (current) drug therapy: Secondary | ICD-10-CM | POA: Insufficient documentation

## 2018-01-23 HISTORY — DX: Other long term (current) drug therapy: Z79.899

## 2018-03-25 ENCOUNTER — Ambulatory Visit: Payer: Commercial Managed Care - PPO | Admitting: Internal Medicine

## 2018-03-25 ENCOUNTER — Encounter: Payer: Self-pay | Admitting: Internal Medicine

## 2018-03-25 VITALS — BP 126/66 | HR 71 | Temp 97.8°F | Ht 73.5 in | Wt 245.2 lb

## 2018-03-25 DIAGNOSIS — E559 Vitamin D deficiency, unspecified: Secondary | ICD-10-CM

## 2018-03-25 DIAGNOSIS — Z0184 Encounter for antibody response examination: Secondary | ICD-10-CM

## 2018-03-25 DIAGNOSIS — G5603 Carpal tunnel syndrome, bilateral upper limbs: Secondary | ICD-10-CM

## 2018-03-25 DIAGNOSIS — E1165 Type 2 diabetes mellitus with hyperglycemia: Secondary | ICD-10-CM

## 2018-03-25 DIAGNOSIS — C801 Malignant (primary) neoplasm, unspecified: Secondary | ICD-10-CM

## 2018-03-25 DIAGNOSIS — R7989 Other specified abnormal findings of blood chemistry: Secondary | ICD-10-CM

## 2018-03-25 DIAGNOSIS — M1990 Unspecified osteoarthritis, unspecified site: Secondary | ICD-10-CM

## 2018-03-25 DIAGNOSIS — D72829 Elevated white blood cell count, unspecified: Secondary | ICD-10-CM

## 2018-03-25 DIAGNOSIS — Z125 Encounter for screening for malignant neoplasm of prostate: Secondary | ICD-10-CM

## 2018-03-25 DIAGNOSIS — K760 Fatty (change of) liver, not elsewhere classified: Secondary | ICD-10-CM

## 2018-03-25 DIAGNOSIS — I2510 Atherosclerotic heart disease of native coronary artery without angina pectoris: Secondary | ICD-10-CM

## 2018-03-25 DIAGNOSIS — R911 Solitary pulmonary nodule: Secondary | ICD-10-CM

## 2018-03-25 DIAGNOSIS — Z13818 Encounter for screening for other digestive system disorders: Secondary | ICD-10-CM

## 2018-03-25 DIAGNOSIS — Z1159 Encounter for screening for other viral diseases: Secondary | ICD-10-CM

## 2018-03-25 DIAGNOSIS — E119 Type 2 diabetes mellitus without complications: Secondary | ICD-10-CM | POA: Insufficient documentation

## 2018-03-25 HISTORY — DX: Atherosclerotic heart disease of native coronary artery without angina pectoris: I25.10

## 2018-03-25 NOTE — Patient Instructions (Addendum)
Think about Prevnar vaccine  High dose flu shot for 65 and above     Diabetes Mellitus and Nutrition When you have diabetes (diabetes mellitus), it is very important to have healthy eating habits because your blood sugar (glucose) levels are greatly affected by what you eat and drink. Eating healthy foods in the appropriate amounts, at about the same times every day, can help you:  Control your blood glucose.  Lower your risk of heart disease.  Improve your blood pressure.  Reach or maintain a healthy weight.  Every person with diabetes is different, and each person has different needs for a meal plan. Your health care provider may recommend that you work with a diet and nutrition specialist (dietitian) to make a meal plan that is best for you. Your meal plan may vary depending on factors such as:  The calories you need.  The medicines you take.  Your weight.  Your blood glucose, blood pressure, and cholesterol levels.  Your activity level.  Other health conditions you have, such as heart or kidney disease.  How do carbohydrates affect me? Carbohydrates affect your blood glucose level more than any other type of food. Eating carbohydrates naturally increases the amount of glucose in your blood. Carbohydrate counting is a method for keeping track of how many carbohydrates you eat. Counting carbohydrates is important to keep your blood glucose at a healthy level, especially if you use insulin or take certain oral diabetes medicines. It is important to know how many carbohydrates you can safely have in each meal. This is different for every person. Your dietitian can help you calculate how many carbohydrates you should have at each meal and for snack. Foods that contain carbohydrates include:  Bread, cereal, rice, pasta, and crackers.  Potatoes and corn.  Peas, beans, and lentils.  Milk and yogurt.  Fruit and juice.  Desserts, such as cakes, cookies, ice cream, and  candy.  How does alcohol affect me? Alcohol can cause a sudden decrease in blood glucose (hypoglycemia), especially if you use insulin or take certain oral diabetes medicines. Hypoglycemia can be a life-threatening condition. Symptoms of hypoglycemia (sleepiness, dizziness, and confusion) are similar to symptoms of having too much alcohol. If your health care provider says that alcohol is safe for you, follow these guidelines:  Limit alcohol intake to no more than 1 drink per day for nonpregnant women and 2 drinks per day for men. One drink equals 12 oz of beer, 5 oz of wine, or 1 oz of hard liquor.  Do not drink on an empty stomach.  Keep yourself hydrated with water, diet soda, or unsweetened iced tea.  Keep in mind that regular soda, juice, and other mixers may contain a lot of sugar and must be counted as carbohydrates.  What are tips for following this plan? Reading food labels  Start by checking the serving size on the label. The amount of calories, carbohydrates, fats, and other nutrients listed on the label are based on one serving of the food. Many foods contain more than one serving per package.  Check the total grams (g) of carbohydrates in one serving. You can calculate the number of servings of carbohydrates in one serving by dividing the total carbohydrates by 15. For example, if a food has 30 g of total carbohydrates, it would be equal to 2 servings of carbohydrates.  Check the number of grams (g) of saturated and trans fats in one serving. Choose foods that have low or no amount  of these fats.  Check the number of milligrams (mg) of sodium in one serving. Most people should limit total sodium intake to less than 2,300 mg per day.  Always check the nutrition information of foods labeled as "low-fat" or "nonfat". These foods may be higher in added sugar or refined carbohydrates and should be avoided.  Talk to your dietitian to identify your daily goals for nutrients listed  on the label. Shopping  Avoid buying canned, premade, or processed foods. These foods tend to be high in fat, sodium, and added sugar.  Shop around the outside edge of the grocery store. This includes fresh fruits and vegetables, bulk grains, fresh meats, and fresh dairy. Cooking  Use low-heat cooking methods, such as baking, instead of high-heat cooking methods like deep frying.  Cook using healthy oils, such as olive, canola, or sunflower oil.  Avoid cooking with butter, cream, or high-fat meats. Meal planning  Eat meals and snacks regularly, preferably at the same times every day. Avoid going long periods of time without eating.  Eat foods high in fiber, such as fresh fruits, vegetables, beans, and whole grains. Talk to your dietitian about how many servings of carbohydrates you can eat at each meal.  Eat 4-6 ounces of lean protein each day, such as lean meat, chicken, fish, eggs, or tofu. 1 ounce is equal to 1 ounce of meat, chicken, or fish, 1 egg, or 1/4 cup of tofu.  Eat some foods each day that contain healthy fats, such as avocado, nuts, seeds, and fish. Lifestyle   Check your blood glucose regularly.  Exercise at least 30 minutes 5 or more days each week, or as told by your health care provider.  Take medicines as told by your health care provider.  Do not use any products that contain nicotine or tobacco, such as cigarettes and e-cigarettes. If you need help quitting, ask your health care provider.  Work with a Social worker or diabetes educator to identify strategies to manage stress and any emotional and social challenges. What are some questions to ask my health care provider?  Do I need to meet with a diabetes educator?  Do I need to meet with a dietitian?  What number can I call if I have questions?  When are the best times to check my blood glucose? Where to find more information:  American Diabetes Association: diabetes.org/food-and-fitness/food  Academy  of Nutrition and Dietetics: PokerClues.dk  [employer] of Diabetes and Digestive and Kidney Diseases (NIH): ContactWire.be Summary  A healthy meal plan will help you control your blood glucose and maintain a healthy lifestyle.  Working with a diet and nutrition specialist (dietitian) can help you make a meal plan that is best for you.  Keep in mind that carbohydrates and alcohol have immediate effects on your blood glucose levels. It is important to count carbohydrates and to use alcohol carefully. This information is not intended to replace advice given to you by your health care provider. Make sure you discuss any questions you have with your health care provider. Document Released: 04/13/2005 Document Revised: 08/21/2016 Document Reviewed: 08/21/2016 Elsevier Interactive Patient Education  2018 [employer].  Pneumococcal Conjugate Vaccine suspension for injection What is this medicine? PNEUMOCOCCAL VACCINE (NEU mo KOK al vak SEEN) is a vaccine used to prevent pneumococcus bacterial infections. These bacteria can cause serious infections like pneumonia, meningitis, and blood infections. This vaccine will lower your chance of getting pneumonia. If you do get pneumonia, it can make your symptoms milder  and your illness shorter. This vaccine will not treat an infection and will not cause infection. This vaccine is recommended for infants and young children, adults with certain medical conditions, and adults 42 years or older. This medicine may be used for other purposes; ask your health care provider or pharmacist if you have questions. COMMON BRAND NAME(S): Prevnar, Prevnar 13 What should I tell my health care provider before I take this medicine? They need to know if you have any of these conditions: -bleeding problems -fever -immune system problems -an unusual  or allergic reaction to pneumococcal vaccine, diphtheria toxoid, other vaccines, latex, other medicines, foods, dyes, or preservatives -pregnant or trying to get pregnant -breast-feeding How should I use this medicine? This vaccine is for injection into a muscle. It is given by a health care professional. A copy of Vaccine Information Statements will be given before each vaccination. Read this sheet carefully each time. The sheet may change frequently. Talk to your pediatrician regarding the use of this medicine in children. While this drug may be prescribed for children as young as 56 weeks old for selected conditions, precautions do apply. Overdosage: If you think you have taken too much of this medicine contact a poison control center or emergency room at once. NOTE: This medicine is only for you. Do not share this medicine with others. What if I miss a dose? It is important not to miss your dose. Call your doctor or health care professional if you are unable to keep an appointment. What may interact with this medicine? -medicines for cancer chemotherapy -medicines that suppress your immune function -steroid medicines like prednisone or cortisone This list may not describe all possible interactions. Give your health care provider a list of all the medicines, herbs, non-prescription drugs, or dietary supplements you use. Also tell them if you smoke, drink alcohol, or use illegal drugs. Some items may interact with your medicine. What should I watch for while using this medicine? Mild fever and pain should go away in 3 days or less. Report any unusual symptoms to your doctor or health care professional. What side effects may I notice from receiving this medicine? Side effects that you should report to your doctor or health care professional as soon as possible: -allergic reactions like skin rash, itching or hives, swelling of the face, lips, or tongue -breathing problems -confused -fast or  irregular heartbeat -fever over 102 degrees F -seizures -unusual bleeding or bruising -unusual muscle weakness Side effects that usually do not require medical attention (report to your doctor or health care professional if they continue or are bothersome): -aches and pains -diarrhea -fever of 102 degrees F or less -headache -irritable -loss of appetite -pain, tender at site where injected -trouble sleeping This list may not describe all possible side effects. Call your doctor for medical advice about side effects. You may report side effects to FDA at 1-800-FDA-1088. Where should I keep my medicine? This does not apply. This vaccine is given in a clinic, pharmacy, doctor's office, or other health care setting and will not be stored at home. NOTE: This sheet is a summary. It may not cover all possible information. If you have questions about this medicine, talk to your doctor, pharmacist, or health care provider.  2018 Elsevier/Gold Standard (2014-04-23 10:27:27)

## 2018-03-25 NOTE — Progress Notes (Addendum)
Chief Complaint  Patient presents with  . Establish Care   NP prior pt [facility] until 01/2018  1.b/l cts has upcoming surgery Dr. Carmine Savoy doing left hand 1st then right 04/09/18  2. Wants to establish new PCP but no complaints no med refills needed  3. CAD s/p stent x 1 f/u [facility]  4. DM 2 uncontrolled last A1c 01/17/18 8.7 on statin and ACEI BP controlled last eye exam 2 years ago [facility]    Review of Systems  Constitutional: Negative for weight loss.  HENT: Negative for hearing loss.   Eyes: Negative for blurred vision.  Respiratory: Negative for shortness of breath.   Cardiovascular: Negative for chest pain.  Gastrointestinal: Negative for abdominal pain.  Musculoskeletal: Positive for back pain and joint pain.  Skin: Negative for rash.  Neurological: Negative for headaches.  Psychiatric/Behavioral: Negative for depression.   Past Medical History:  Diagnosis Date  . Abnormal CT of the abdomen   . CAD (coronary artery disease)    s/p MI  . Diabetes mellitus without complication ([place])   . 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 Hypercholesterolemia   . Hypertension   . Personal history of tobacco use, presenting hazards to health 07/13/2015  . Pneumonia   . 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 COLONOSCOPY WITH PROPOFOL N/A 03/26/2015   Procedure: COLONOSCOPY WITH PROPOFOL;  Surgeon: Josefine Class, MD;  Location: [facility] ENDOSCOPY;  Service: Endoscopy;  Laterality: N/A;  . KNEE SURGERY     Family History  Problem Relation Age of Onset  . Diabetes Mellitus II Maternal Grandfather   . CAD Paternal Grandfather    Social History   Socioeconomic History  . Marital status: Married    Spouse name: Not on file  . Number of children: Not on file  . Years of education: Not on file  . Highest education level: Not on file  Occupational History  . Not on file  Social Needs  . Financial resource strain:  Not on file  . Food insecurity:    Worry: Not on file    Inability: Not on file  . Transportation needs:    Medical: Not on file    Non-medical: Not on file  Tobacco Use  . Smoking status: Current Every Day Smoker    Packs/day: 1.00    Years: 30.00    Pack years: 30.00    Types: Cigarettes  . Smokeless tobacco: Never Used  Substance and Sexual Activity  . Alcohol use: No  . Drug use: No  . Sexual activity: Yes    Birth control/protection: None  Lifestyle  . Physical activity:    Days per week: Not on file    Minutes per session: Not on file  . Stress: Not on file  Relationships  . Social connections:    Talks on phone: Not on file    Gets together: Not on file    Attends religious service: Not on file    Active member of club or organization: Not on file    Attends meetings of clubs or organizations: Not on file    Relationship status: Not on file  . Intimate partner violence:    Fear of current or ex partner: Not on file    Emotionally abused: Not on file    Physically abused: Not on file    Forced sexual activity: Not on file  Other Topics Concern  .  Not on file  Social History Narrative  . Not on file   Current Meds  Medication Sig  . aspirin 81 MG tablet Take 81 mg by mouth daily.  . Choline Fenofibrate (FENOFIBRIC ACID) 135 MG CPDR Take 135 mg by mouth daily.   . clopidogrel (PLAVIX) 75 MG tablet Take 75 mg by mouth daily.  . empagliflozin (JARDIANCE) 10 MG TABS tablet Take by mouth.  Marland Kitchen lisinopril (PRINIVIL,ZESTRIL) 2.5 MG tablet Take 2.5 mg by mouth daily.   . metFORMIN (GLUCOPHAGE-XR) 500 MG 24 hr tablet Take 2,000 mg by mouth daily with breakfast.  . metoprolol (LOPRESSOR) 50 MG tablet Take 50 mg by mouth daily.   . Omega-3 Fatty Acids (FISH OIL) 1000 MG CAPS Take 1,000 mg by mouth daily.  . Probiotic Product (PROBIOTIC DAILY PO) Take by mouth.  . simvastatin (ZOCOR) 80 MG tablet Take 80 mg by mouth daily.  Marland Kitchen VITAMIN D, ERGOCALCIFEROL, PO Take by mouth  daily.    Allergies  Allergen Reactions  . Ibuprofen Itching  . Tylenol [Acetaminophen] Itching   No results found for this or any previous visit (from the past 2160 hour(s)). Objective  Body mass index is 31.91 kg/m. Wt Readings from Last 3 Encounters:  03/25/18 245 lb 3.2 oz (111.2 kg)  01/29/17 260 lb 1.6 oz (118 kg)  01/25/17 240 lb (108.9 kg)   Temp Readings from Last 3 Encounters:  03/25/18 97.8 F (36.6 C) (Oral)  01/30/17 98.8 F (37.1 C) (Oral)  01/26/17 100 F (37.8 C)   BP Readings from Last 3 Encounters:  03/25/18 126/66  04/11/17 103/63  01/30/17 (!) 125/51   Pulse Readings from Last 3 Encounters:  03/25/18 71  04/11/17 81  01/30/17 64    Physical Exam  Constitutional: He is oriented to person, place, and time. Vital signs are normal. He appears well-developed and well-nourished. He is cooperative.  HENT:  Head: Normocephalic and atraumatic.  Mouth/Throat: Oropharynx is clear and moist and mucous membranes are normal.  Eyes: Pupils are equal, round, and reactive to light. Conjunctivae are normal.  Cardiovascular: Normal rate, regular rhythm and normal heart sounds.  Pulmonary/Chest: Effort normal and breath sounds normal.  Musculoskeletal:  jt deformities hands b/l   Neurological: He is alert and oriented to person, place, and time. Gait normal.  Wearing knee braces b/l knes f/u with [facility] Dr. Radford Pax [facility] 04/03/18   Skin: Skin is warm, dry and intact.     Psychiatric: He has a normal mood and affect. His speech is normal and behavior is normal. Judgment and thought content normal. Cognition and memory are normal.  Nursing note and vitals reviewed.   Assessment   1. DM 2 A1C 8.7 01/17/18  2. CAD s/p stent x 1 Dr. Josefa Half, mild valvular heart disease echo 04/30/15 mild LVH, nl RVSF, mild valve refurgitation 3. 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 Acey Lav. CT ab/pelvis had  01/03/18 negative 4. H/o arthritis r/o autoimmune  5. H/o fatty liver  6. Vitamin D def 25 01/17/18  7. H/o elevated TSH 01/28/17 5.370  8. H/o lung nodules  -consider CT chest to f/u in future  9. B/l CTS pending surgery 04/09/18 left then right  10.HM  Plan   1. With one touch supplies per pharmacy  Will need to check fasting labs  Get eye exam records [facility] needs eye exam last 2 years ago  On jardiance 10 mg  and metformin ER 2000 qam  2. F/u cards q6 months 3. F/u [facility] q6 months 4. Check RA labs 5. Check hep status see if needs hep A/B vaccines  6/on 2000 iu D3 qd  7. Repeat thyroid lab  8. rec repeat CT chest pt wants to hold for now  9. Upcoming surgery  10.  Flu shot had at work 05/08/18  Tdap and shingles [facility]  Consider pna vxs if has not had  Will check hep B and mmr status if not done   H/o colon polyps 03/26/15 polys x 3-4 tubular and hyperplastic repeat in 3-5 years [facility] Will check PSA  H/o lung nodules consider ct chest in future  -smoker age 5 to present max 1.5 ppd now <1 ppd no FH lung cancer rec cessation  Consider dermatology in the future    Reviewed labs 01/17/18 [facility]  BMET Na 137, K 4.2, Cl 98, CO2 25, BUN 21 (sl elevated nl 20), Cr 1.1 Glucose 216, calcium 9/9, BUN/Cr  19 GFR 69  Albumin 4.1  CBC 10.6 H/H 15.6/46.8 plts 157 01/30/18 CBC 16.3 H/H 13.2/39.0 plts 140  ABO RH type 01/29/18 A+ B12 01/23/18 474  MRSA negative 01/17/18  EKG 08/13/00 NSR, left atrial enlargement, nonspecific T wave changes,   Surgery sch 06/04/18 [facility]   [facility]  Dentist Dr. Cyndia Diver  Neurosurgeon Dr. Manson Passey [facility]  Provider: Dr. [name]

## 2018-03-25 NOTE — Progress Notes (Signed)
Pre visit review using our clinic review tool, if applicable. No additional management support is needed unless otherwise documented below in the visit note. 

## 2018-03-27 ENCOUNTER — Telehealth: Payer: Self-pay

## 2018-03-27 NOTE — Telephone Encounter (Signed)
Copied from [id number]. Topic: Quick Communication - See Telephone Encounter >> Mar 27, 2018  3:08 PM Hewitt Shorts wrote: Pt is needing to speak with Dr. Aundra Dubin in regards to his appt on 9/19 for labs -he states he has had a lot of labs from [place] and does he need to see if they were done there and get a copy  Also he is having surgery on 04/09/18 that lab visit may not be possible  Best number [phone]

## 2018-03-28 ENCOUNTER — Encounter: Payer: Self-pay | Admitting: Internal Medicine

## 2018-03-28 DIAGNOSIS — R911 Solitary pulmonary nodule: Secondary | ICD-10-CM | POA: Insufficient documentation

## 2018-03-28 DIAGNOSIS — C801 Malignant (primary) neoplasm, unspecified: Secondary | ICD-10-CM

## 2018-03-28 DIAGNOSIS — G5600 Carpal tunnel syndrome, unspecified upper limb: Secondary | ICD-10-CM | POA: Insufficient documentation

## 2018-03-28 DIAGNOSIS — R7989 Other specified abnormal findings of blood chemistry: Secondary | ICD-10-CM | POA: Insufficient documentation

## 2018-03-28 HISTORY — DX: Other specified abnormal findings of blood chemistry: R79.89

## 2018-03-28 HISTORY — DX: Carpal tunnel syndrome, unspecified upper limb: G56.00

## 2018-03-28 HISTORY — DX: Malignant (primary) neoplasm, unspecified: C80.1

## 2018-03-28 NOTE — Telephone Encounter (Signed)
Yes these labs will need to be repeated and more extensive labs 1-2 weeks before f/u 06/25/18 appt with me  Please sch fasting lab appt  Good luck with surgery   [place]

## 2018-03-28 NOTE — Addendum Note (Signed)
Addended by: [name] on: 03/28/2018 12:25 PM   Modules accepted: Orders

## 2018-04-03 NOTE — Telephone Encounter (Signed)
Patient wanted to let Dr Olivia Mackie know that he will be having labs done already. He will bring results after surgery.

## 2018-04-04 NOTE — Telephone Encounter (Signed)
Left message for patient to return call back. [facility] may give information.  

## 2018-04-04 NOTE — Telephone Encounter (Signed)
Thank you but if labs I wanted were not done we will need to do those  Well wishes with surgery   [place]

## 2018-04-19 ENCOUNTER — Other Ambulatory Visit: Payer: Commercial Managed Care - PPO

## 2018-05-10 ENCOUNTER — Other Ambulatory Visit: Payer: Self-pay | Admitting: Internal Medicine

## 2018-05-10 DIAGNOSIS — I10 Essential (primary) hypertension: Secondary | ICD-10-CM

## 2018-05-10 MED ORDER — METOPROLOL TARTRATE 50 MG PO TABS: 50.0000 mg | ORAL_TABLET | Freq: Two times a day (BID) | ORAL | 3 refills | Status: DC

## 2018-05-10 NOTE — Telephone Encounter (Signed)
Yes Ive written the patient and wife and would like fasting labs labs he has had in the past are not comprehensive and I need comprehensive labs   Thanks [place]

## 2018-05-10 NOTE — Addendum Note (Signed)
Addended by: [name] on: 05/10/2018 06:41 PM   Modules accepted: Orders

## 2018-05-10 NOTE — Telephone Encounter (Signed)
Pt wife dropped off some recent labs and wanted to know if he still need to come in.. placed labs in Dr. Claris Gladden color folder upfront  Please advise pt wife at [phone]

## 2018-05-14 NOTE — Telephone Encounter (Signed)
Left message for patient to return call back. [facility] may give information.  

## 2018-05-14 NOTE — Telephone Encounter (Signed)
Please advice  

## 2018-05-14 NOTE — Telephone Encounter (Signed)
Scheduled pt for labs-pt would like to know is this is absolutely necessary as he has had two surgeries in the last month at [facility] and wonders if this was lab work that he has already had and can get transferred over. Please advise.

## 2018-05-14 NOTE — Telephone Encounter (Signed)
  Yes Ive written the patient and wife and would like fasting labs labs he has had in the past are not comprehensive and I need comprehensive labs

## 2018-05-15 ENCOUNTER — Telehealth: Payer: Self-pay | Admitting: Internal Medicine

## 2018-05-15 ENCOUNTER — Encounter: Payer: Self-pay | Admitting: Internal Medicine

## 2018-05-15 ENCOUNTER — Ambulatory Visit (INDEPENDENT_AMBULATORY_CARE_PROVIDER_SITE_OTHER): Payer: Commercial Managed Care - PPO | Admitting: Internal Medicine

## 2018-05-15 VITALS — BP 122/64 | HR 57 | Temp 98.2°F | Ht 73.5 in | Wt 248.2 lb

## 2018-05-15 DIAGNOSIS — H6123 Impacted cerumen, bilateral: Secondary | ICD-10-CM

## 2018-05-15 DIAGNOSIS — K529 Noninfective gastroenteritis and colitis, unspecified: Secondary | ICD-10-CM

## 2018-05-15 DIAGNOSIS — M1990 Unspecified osteoarthritis, unspecified site: Secondary | ICD-10-CM | POA: Diagnosis not present

## 2018-05-15 DIAGNOSIS — E1165 Type 2 diabetes mellitus with hyperglycemia: Secondary | ICD-10-CM

## 2018-05-15 NOTE — Patient Instructions (Addendum)
Increase hydration with water Bananas, rice, applesauce, toast, oatmeal, broth for now  Get some peptobismol  Try some low sugar gatorade   Try debrox ear drops monthly x 4-7 days over the counter   Food Choices to Help Relieve Diarrhea, Adult When you have diarrhea, the foods you eat and your eating habits are very important. Choosing the right foods and drinks can help:  Relieve diarrhea.  Replace lost fluids and nutrients.  Prevent dehydration.  What general guidelines should I follow? Relieving diarrhea  Choose foods with less than 2 g or .07 oz. of fiber per serving.  Limit fats to less than 8 tsp (38 g or 1.34 oz.) a day.  Avoid the following: ? Foods and beverages sweetened with high-fructose corn syrup, honey, or sugar alcohols such as xylitol, sorbitol, and mannitol. ? Foods that contain a lot of fat or sugar. ? Fried, greasy, or spicy foods. ? High-fiber grains, breads, and cereals. ? Raw fruits and vegetables.  Eat foods that are rich in probiotics. These foods include dairy products such as yogurt and fermented milk products. They help increase healthy bacteria in the stomach and intestines (gastrointestinal tract, or GI tract).  If you have lactose intolerance, avoid dairy products. These may make your diarrhea worse.  Take medicine to help stop diarrhea (antidiarrheal medicine) only as told by your health care provider. Replacing nutrients  Eat small meals or snacks every 3-4 hours.  Eat bland foods, such as white rice, toast, or baked potato, until your diarrhea starts to get better. Gradually reintroduce nutrient-rich foods as tolerated or as told by your health care provider. This includes: ? Well-cooked protein foods. ? Peeled, seeded, and soft-cooked fruits and vegetables. ? Low-fat dairy products.  Take vitamin and mineral supplements as told by your health care provider. Preventing dehydration   Start by sipping water or a special solution to  prevent dehydration (oral rehydration solution, ORS). Urine that is clear or pale yellow means that you are getting enough fluid.  Try to drink at least 8-10 cups of fluid each day to help replace lost fluids.  You may add other liquids in addition to water, such as clear juice or decaffeinated sports drinks, as tolerated or as told by your health care provider.  Avoid drinks with caffeine, such as coffee, tea, or soft drinks.  Avoid alcohol. What foods are recommended? The items listed may not be a complete list. Talk with your health care provider about what dietary choices are best for you. Grains White rice. White, [place], or pita breads (fresh or toasted), including plain rolls, buns, or bagels. White pasta. Saltine, soda, or graham crackers. Pretzels. Low-fiber cereal. Cooked cereals made with water (such as cornmeal, farina, or cream cereals). Plain muffins. Matzo. Melba toast. Zwieback. Vegetables Potatoes (without the skin). Most well-cooked and canned vegetables without skins or seeds. Tender lettuce. Fruits Apple sauce. Fruits canned in juice. Cooked apricots, cherries, grapefruit, peaches, pears, or plums. Fresh bananas and cantaloupe. Meats and other protein foods Baked or boiled chicken. Eggs. Tofu. Fish. Seafood. Smooth nut butters. Ground or well-cooked tender beef, ham, veal, lamb, pork, or poultry. Dairy Plain yogurt, kefir, and unsweetened liquid yogurt. Lactose-free milk, buttermilk, skim milk, or soy milk. Low-fat or nonfat hard cheese. Beverages Water. Low-calorie sports drinks. Fruit juices without pulp. Strained tomato and vegetable juices. Decaffeinated teas. Sugar-free beverages not sweetened with sugar alcohols. Oral rehydration solutions, if approved by your health care provider. Seasoning and other foods Bouillon, broth, or soups  made from recommended foods. What foods are not recommended? The items listed may not be a complete list. Talk with your health care  provider about what dietary choices are best for you. Grains Whole grain, whole wheat, bran, or rye breads, rolls, pastas, and crackers. Wild or brown rice. Whole grain or bran cereals. Barley. Oats and oatmeal. Corn tortillas or taco shells. Granola. Popcorn. Vegetables Raw vegetables. Fried vegetables. Cabbage, broccoli, Brussels sprouts, artichokes, baked beans, beet greens, corn, kale, legumes, peas, sweet potatoes, and yams. Potato skins. Cooked spinach and cabbage. Fruits Dried fruit, including raisins and dates. Raw fruits. Stewed or dried prunes. Canned fruits with syrup. Meat and other protein foods Fried or fatty meats. Deli meats. Chunky nut butters. Nuts and seeds. Beans and lentils. Berniece Salines. Hot dogs. Sausage. Dairy High-fat cheeses. Whole milk, chocolate milk, and beverages made with milk, such as milk shakes. Half-and-half. Cream. sour cream. Ice cream. Beverages Caffeinated beverages (such as coffee, tea, soda, or energy drinks). Alcoholic beverages. Fruit juices with pulp. Prune juice. Soft drinks sweetened with high-fructose corn syrup or sugar alcohols. High-calorie sports drinks. Fats and oils Butter. Cream sauces. Margarine. Salad oils. Plain salad dressings. Olives. Avocados. Mayonnaise. Sweets and desserts Sweet rolls, doughnuts, and sweet breads. Sugar-free desserts sweetened with sugar alcohols such as xylitol and sorbitol. Seasoning and other foods Honey. Hot sauce. Chili powder. Gravy. Cream-based or milk-based soups. Pancakes and waffles. Summary  When you have diarrhea, the foods you eat and your eating habits are very important.  Make sure you get at least 8-10 cups of fluid each day, or enough to keep your urine clear or pale yellow.  Eat bland foods and gradually reintroduce healthy, nutrient-rich foods as tolerated, or as told by your health care provider.  Avoid high-fiber, fried, greasy, or spicy foods. This information is not intended to replace advice  given to you by your health care provider. Make sure you discuss any questions you have with your health care provider. Document Released: 10/07/2003 Document Revised: 07/14/2016 Document Reviewed: 07/14/2016 Elsevier Interactive Patient Education  2018 [place] Diet A bland diet consists of foods that do not have a lot of fat or fiber. Foods without fat or fiber are easier for the body to digest. They are also less likely to irritate your mouth, throat, stomach, and other parts of your gastrointestinal tract. A bland diet is sometimes called a BRAT diet. What is my plan? Your health care provider or dietitian may recommend specific changes to your diet to prevent and treat your symptoms, such as:  Eating small meals often.  Cooking food until it is soft enough to chew easily.  Chewing your food well.  Drinking fluids slowly.  Not eating foods that are very spicy, sour, or fatty.  Not eating citrus fruits, such as oranges and grapefruit.  What do I need to know about this diet?  Eat a variety of foods from the bland diet food list.  Do not follow a bland diet longer than you have to.  Ask your health care provider whether you should take vitamins. What foods can I eat? Grains  Hot cereals, such as cream of wheat. Bread, crackers, or tortillas made from refined white flour. Rice. Vegetables Canned or cooked vegetables. Mashed or boiled potatoes. Fruits Bananas. Applesauce. Other types of cooked or canned fruit with the skin and seeds removed, such as canned peaches or pears. Meats and Other Protein Sources Scrambled eggs. Creamy peanut butter or other nut butters. Lean,  well-cooked meats, such as chicken or fish. Tofu. Soups or broths. Dairy Low-fat dairy products, such as milk, cottage cheese, or yogurt. Beverages Water. Herbal tea. Apple juice. Sweets and Desserts Pudding. Custard. Fruit gelatin. Ice cream. Fats and Oils Mild salad dressings. Canola or olive  oil. The items listed above may not be a complete list of allowed foods or beverages. Contact your dietitian for more options. What foods are not recommended? Foods and ingredients that are often not recommended include:  Spicy foods, such as hot sauce or salsa.  Fried foods.  Sour foods, such as pickled or fermented foods.  Raw vegetables or fruits, especially citrus or berries.  Caffeinated drinks.  Alcohol.  Strongly flavored seasonings or condiments.  The items listed above may not be a complete list of foods and beverages that are not allowed. Contact your dietitian for more information. This information is not intended to replace advice given to you by your health care provider. Make sure you discuss any questions you have with your health care provider. Document Released: 11/08/2015 Document Revised: 12/23/2015 Document Reviewed: 07/29/2014 Elsevier Interactive Patient Education  2018 [employer].

## 2018-05-15 NOTE — Telephone Encounter (Signed)
Copied from [id number]. Topic: General - Other >> May 15, 2018 10:45 AM Keene Breath wrote: Reason for CRM: Patient's wife called to request a Sleep Apnea Test for her husband.  Wife stated that the patient just had an appointment with the doctor but forgot to ask about the test.  He has not had one in over a year and she is concerned.  Please advise and call her back when the referral is placed.  CB# [phone]

## 2018-05-15 NOTE — Telephone Encounter (Signed)
Patient notified and voiced understanding he will fast 12 hours prior to labs.

## 2018-05-15 NOTE — Progress Notes (Signed)
Chief Complaint  Patient presents with  . Follow-up   F/u  1. C/o upset stomach and diarrhea already 3x today after eating [place] last night ran out of peptobismol so far nothing tried  2. Pending right hand surgery 11/3 or 06/04/18 with [facility] he has had multiple orthopedic surgeries in the past  3. DM 2 A1C 8.7 01/17/18 on Jardiance 10 mg qd, metformin 2000 mg qd with breakfast on ACEI and statin   Review of Systems  Constitutional: Negative for weight loss.  HENT: Negative for hearing loss.   Eyes: Negative for blurred vision.  Respiratory: Negative for shortness of breath.   Cardiovascular: Negative for chest pain.  Gastrointestinal: Negative for abdominal pain.  Musculoskeletal: Positive for joint pain.  Skin: Negative for rash.  Neurological: Negative for headaches.  Psychiatric/Behavioral: Negative for depression.   Past Medical History:  Diagnosis Date  . Abnormal CT of the abdomen   . CAD (coronary artery disease)    s/p MI  . CAD (coronary artery disease)    s/p stent x 1 2004   . Cancer (HCC)    spindle cell stomach   . Diabetes mellitus without complication ([place])   . 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 Hypercholesterolemia   . Hypertension   . Kidney stones   . Personal history of tobacco use, presenting hazards to health 07/13/2015  . Pneumonia   . 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 BACK SURGERY     01/29/18 [facility] lower  back   . COLONOSCOPY WITH PROPOFOL N/A 03/26/2015   Procedure: COLONOSCOPY WITH PROPOFOL;  Surgeon: Josefine Class, MD;  Location: [facility] ENDOSCOPY;  Service: Endoscopy;  Laterality: N/A;  . KNEE SURGERY    . KNEE SURGERY     b/l partial knee Dr. Leanna Sato   . PARTIAL GASTRECTOMY     2016 [facility]   Family History  Problem Relation Age of Onset  . Diabetes Mellitus II Maternal Grandfather   . Heart disease Maternal Grandfather   . CAD Paternal Grandfather   . Cancer Paternal  Grandfather        ? type  . Arthritis Mother   . Depression Mother   . Hearing loss Mother   . Diabetes Father   . Cancer Brother        throat smoker   . Hearing loss Maternal Grandmother   . Arthritis Maternal Grandmother    Social History   Socioeconomic History  . Marital status: Married    Spouse name: Not on file  . Number of children: Not on file  . Years of education: Not on file  . Highest education level: Not on file  Occupational History  . Not on file  Social Needs  . Financial resource strain: Not on file  . Food insecurity:    Worry: Not on file    Inability: Not on file  . Transportation needs:    Medical: Not on file    Non-medical: Not on file  Tobacco Use  . Smoking status: Current Every Day Smoker    Packs/day: 1.00    Years: 30.00    Pack years: 30.00    Types: Cigarettes  . Smokeless tobacco: Never Used  Substance and Sexual Activity  . Alcohol use: No  . Drug use: No  . Sexual activity: Yes    Birth control/protection: None  Lifestyle  . Physical activity:    Days  per week: Not on file    Minutes per session: Not on file  . Stress: Not on file  Relationships  . Social connections:    Talks on phone: Not on file    Gets together: Not on file    Attends religious service: Not on file    Active member of club or organization: Not on file    Attends meetings of clubs or organizations: Not on file    Relationship status: Not on file  . Intimate partner violence:    Fear of current or ex partner: Not on file    Emotionally abused: Not on file    Physically abused: Not on file    Forced sexual activity: Not on file  Other Topics Concern  . Not on file  Social History Narrative   Owns guns    Wears seat belt    Safe in relationship    Married 2 sons    On disability    Assoc degree and prev supervisor       Current Meds  Medication Sig  . aspirin 81 MG tablet Take 81 mg by mouth daily.  . Cholecalciferol (VITAMIN D3) 2000 units  TABS Take by mouth.  . Choline Fenofibrate (FENOFIBRIC ACID) 135 MG CPDR Take 135 mg by mouth daily.   . clopidogrel (PLAVIX) 75 MG tablet Take 75 mg by mouth daily.  . empagliflozin (JARDIANCE) 10 MG TABS tablet Take 10 mg by mouth daily.   Marland Kitchen lisinopril (PRINIVIL,ZESTRIL) 2.5 MG tablet Take 2.5 mg by mouth daily.   Marland Kitchen METFORMIN HCL ER PO Take 2,000 mg by mouth daily with breakfast.   . metoprolol tartrate (LOPRESSOR) 50 MG tablet Take 1 tablet (50 mg total) by mouth 2 (two) times daily.  . Omega-3 Fatty Acids (FISH OIL) 1000 MG CAPS Take 1,000 mg by mouth daily.  . Probiotic Product (PROBIOTIC DAILY PO) Take by mouth.  . rosuvastatin (CRESTOR) 20 MG tablet Take 20 mg by mouth daily.   Allergies  Allergen Reactions  . Ibuprofen Itching   No results found for this or any previous visit (from the past 2160 hour(s)). Objective  Body mass index is 32.31 kg/m. Wt Readings from Last 3 Encounters:  05/15/18 248 lb 4 oz (112.6 kg)  03/25/18 245 lb 3.2 oz (111.2 kg)  01/29/17 260 lb 1.6 oz (118 kg)   Temp Readings from Last 3 Encounters:  05/15/18 98.2 F (36.8 C) (Oral)  03/25/18 97.8 F (36.6 C) (Oral)  01/30/17 98.8 F (37.1 C) (Oral)   BP Readings from Last 3 Encounters:  05/15/18 122/64  03/25/18 126/66  04/11/17 103/63   Pulse Readings from Last 3 Encounters:  05/15/18 (!) 57  03/25/18 71  04/11/17 81    Physical Exam  Constitutional: He is oriented to person, place, and time. Vital signs are normal. He appears well-developed and well-nourished. He is cooperative.  HENT:  Head: Normocephalic and atraumatic.  Mouth/Throat: Oropharynx is clear and moist and mucous membranes are normal.  Eyes: Pupils are equal, round, and reactive to light. Conjunctivae are normal.  Cardiovascular: Normal rate, regular rhythm and normal heart sounds.  Pulmonary/Chest: Effort normal and breath sounds normal.  Abdominal: There is no tenderness.  Neurological: He is alert and oriented to  person, place, and time. Gait normal.  Skin: Skin is warm, dry and intact.  Psychiatric: He has a normal mood and affect. His speech is normal and behavior is normal. Judgment and thought content normal. Cognition and memory  are normal.  Nursing note and vitals reviewed.   Assessment   1. Acute gastroenteritis likely due to food vs ? Metformin use causing diarrhea  2. H/o arthritis OA/DD r/o RA  3. DM2 A1C 9.7 01/17/18  4. HM 5. Cerumen in ears with hearing aids  Plan   1. Brat, peptobismol  F/u prn  2. F/u [facility] right hand surgery 11/3 or 06/04/18 s/p left hand surgery he will be out of work x 4 weeks  3. jardiance 10 mg qd, metformin 2000 mg qd  4.  Flu shot had at work 05/08/18  Tdap and shingles check [facility] need records  Consider pna vxs if has not had  Will check hep B and mmr status if not done   H/o colon polyps 03/26/15 polys x 3-4 tubular and hyperplastic repeat in 3-5 years [facility] Will check PSA  H/o lung nodules consider ct chest in future  -smoker age 29 to present max 1.5 ppd now <1 ppd no FH lung cancer rec cessation  Consider dermatology in the future  Pt to sch fasting labs visit on 05/16/18 for labs prev ordered   5. rec Debrox otc to use check ears at f/u   Reviewed labs 01/17/18 [facility]  BMET Na 137, K 4.2, Cl 98, CO2 25, BUN 21 (sl elevated nl 20), Cr 1.1 Glucose 216, calcium 9/9, BUN/Cr  19 GFR 69  Albumin 4.1  CBC 10.6 H/H 15.6/46.8 plts 157 01/30/18 CBC 16.3 H/H 13.2/39.0 plts 140  ABO RH type 01/29/18 A+ B12 01/23/18 474  MRSA negative 01/17/18  EKG 08/13/00 NSR, left atrial enlargement, nonspecific T wave changes,   Surgery sch 06/04/18 [facility]   Provider: Dr. [name]-Internal Medicine

## 2018-05-16 ENCOUNTER — Other Ambulatory Visit (INDEPENDENT_AMBULATORY_CARE_PROVIDER_SITE_OTHER): Payer: Commercial Managed Care - PPO

## 2018-05-16 ENCOUNTER — Encounter: Payer: Self-pay | Admitting: Internal Medicine

## 2018-05-16 ENCOUNTER — Other Ambulatory Visit: Payer: Self-pay | Admitting: Internal Medicine

## 2018-05-16 DIAGNOSIS — Z1159 Encounter for screening for other viral diseases: Secondary | ICD-10-CM

## 2018-05-16 DIAGNOSIS — Z13818 Encounter for screening for other digestive system disorders: Secondary | ICD-10-CM

## 2018-05-16 DIAGNOSIS — H6123 Impacted cerumen, bilateral: Secondary | ICD-10-CM

## 2018-05-16 DIAGNOSIS — E1165 Type 2 diabetes mellitus with hyperglycemia: Secondary | ICD-10-CM

## 2018-05-16 DIAGNOSIS — K760 Fatty (change of) liver, not elsewhere classified: Secondary | ICD-10-CM

## 2018-05-16 DIAGNOSIS — G4730 Sleep apnea, unspecified: Secondary | ICD-10-CM

## 2018-05-16 DIAGNOSIS — Z125 Encounter for screening for malignant neoplasm of prostate: Secondary | ICD-10-CM

## 2018-05-16 DIAGNOSIS — R7989 Other specified abnormal findings of blood chemistry: Secondary | ICD-10-CM

## 2018-05-16 DIAGNOSIS — D72829 Elevated white blood cell count, unspecified: Secondary | ICD-10-CM

## 2018-05-16 DIAGNOSIS — Z0184 Encounter for antibody response examination: Secondary | ICD-10-CM

## 2018-05-16 DIAGNOSIS — M1990 Unspecified osteoarthritis, unspecified site: Secondary | ICD-10-CM

## 2018-05-16 HISTORY — DX: Impacted cerumen, bilateral: H61.23

## 2018-05-16 NOTE — Telephone Encounter (Signed)
Referred to [place] [facility] for sleep study test   [place]

## 2018-05-16 NOTE — Addendum Note (Signed)
Addended by: Arby Barrette on: 05/16/2018 08:00 AM   Modules accepted: Orders

## 2018-05-17 ENCOUNTER — Encounter: Payer: Self-pay | Admitting: Internal Medicine

## 2018-05-17 LAB — MICROALBUMIN / CREATININE URINE RATIO
Creatinine, Urine: 119.6 mg/dL
Microalb/Creat Ratio: 5.4 mg/g creat (ref 0.0–30.0)
Microalbumin, Urine: 6.4 ug/mL

## 2018-05-17 LAB — URINALYSIS, ROUTINE W REFLEX MICROSCOPIC
Bilirubin, UA: NEGATIVE
Ketones, UA: NEGATIVE
Leukocytes, UA: NEGATIVE
Nitrite, UA: NEGATIVE
Protein, UA: NEGATIVE
RBC, UA: NEGATIVE
Specific Gravity, UA: >=1.03 — AB (ref 1.005–1.030)
Urobilinogen, Ur: 0.2 mg/dL (ref 0.2–1.0)
pH, UA: 5 (ref 5.0–7.5)

## 2018-05-18 LAB — CBC WITH DIFFERENTIAL/PLATELET
Basophils Absolute: 91 cells/uL (ref 0–200)
Basophils Relative: 0.9 %
Eosinophils Absolute: 1545 cells/uL — ABNORMAL HIGH (ref 15–500)
Eosinophils Relative: 15.3 %
HCT: 44.5 % (ref 38.5–50.0)
Hemoglobin: 15.2 g/dL (ref 13.2–17.1)
Lymphs Abs: 2707 cells/uL (ref 850–3900)
MCH: 31 pg (ref 27.0–33.0)
MCHC: 34.2 g/dL (ref 32.0–36.0)
MCV: 90.8 fL (ref 80.0–100.0)
MPV: 11.6 fL (ref 7.5–12.5)
Monocytes Relative: 6.1 %
Neutro Abs: 5141 cells/uL (ref 1500–7800)
Neutrophils Relative %: 50.9 %
Platelets: 160 10*3/uL (ref 140–400)
RBC: 4.9 10*6/uL (ref 4.20–5.80)
RDW: 12.8 % (ref 11.0–15.0)
Total Lymphocyte: 26.8 %
WBC mixed population: 616 cells/uL (ref 200–950)
WBC: 10.1 10*3/uL (ref 3.8–10.8)

## 2018-05-18 LAB — COMPREHENSIVE METABOLIC PANEL
AG Ratio: 1.4 (calc) (ref 1.0–2.5)
ALT: 22 U/L (ref 9–46)
AST: 24 U/L (ref 10–35)
Albumin: 4.3 g/dL (ref 3.6–5.1)
Alkaline phosphatase (APISO): 63 U/L (ref 40–115)
BUN: 20 mg/dL (ref 7–25)
CO2: 23 mmol/L (ref 20–32)
Calcium: 9.8 mg/dL (ref 8.6–10.3)
Chloride: 102 mmol/L (ref 98–110)
Creat: 1.01 mg/dL (ref 0.70–1.25)
Globulin: 3 g/dL (calc) (ref 1.9–3.7)
Glucose, Bld: 140 mg/dL — ABNORMAL HIGH (ref 65–99)
Potassium: 4.7 mmol/L (ref 3.5–5.3)
Sodium: 137 mmol/L (ref 135–146)
Total Bilirubin: 0.3 mg/dL (ref 0.2–1.2)
Total Protein: 7.3 g/dL (ref 6.1–8.1)

## 2018-05-18 LAB — PSA: PSA: 0.3 ng/mL (ref <=4.0)

## 2018-05-18 LAB — HEPATITIS C ANTIBODY
Hepatitis C Ab: NONREACTIVE
SIGNAL TO CUT-OFF: 0.04 (ref <1.00)

## 2018-05-18 LAB — CYCLIC CITRUL PEPTIDE ANTIBODY, IGG: Cyclic Citrullin Peptide Ab: <16 UNITS

## 2018-05-18 LAB — HEMOGLOBIN A1C
Hgb A1c MFr Bld: 6.7 % of total Hgb — ABNORMAL HIGH (ref <5.7)
Mean Plasma Glucose: 146 (calc)
eAG (mmol/L): 8.1 (calc)

## 2018-05-18 LAB — LIPID PANEL
Cholesterol: 151 mg/dL (ref <200)
HDL: 32 mg/dL — ABNORMAL LOW (ref >40)
LDL Cholesterol (Calc): 87 mg/dL (calc)
Non-HDL Cholesterol (Calc): 119 mg/dL (calc) (ref <130)
Total CHOL/HDL Ratio: 4.7 (calc) (ref <5.0)
Triglycerides: 233 mg/dL — ABNORMAL HIGH (ref <150)

## 2018-05-18 LAB — MEASLES/MUMPS/RUBELLA IMMUNITY
Mumps IgG: 99.7 AU/mL
Rubella: 22.9 index
Rubeola IgG: >300 AU/mL

## 2018-05-18 LAB — RHEUMATOID FACTOR: Rhuematoid fact SerPl-aCnc: <14 IU/mL (ref <14)

## 2018-05-18 LAB — C-REACTIVE PROTEIN: CRP: 2 mg/L (ref <8.0)

## 2018-05-18 LAB — ANA: Anti Nuclear Antibody(ANA): NEGATIVE

## 2018-05-18 LAB — TSH: TSH: 2.14 mIU/L (ref 0.40–4.50)

## 2018-05-18 LAB — HEPATITIS B SURFACE ANTIBODY, QUANTITATIVE: Hepatitis B-Post: <5 m[IU]/mL — ABNORMAL LOW (ref >=10)

## 2018-05-18 LAB — SEDIMENTATION RATE: Sed Rate: 22 mm/h — ABNORMAL HIGH (ref 0–20)

## 2018-05-18 LAB — HEPATITIS B SURFACE ANTIGEN: Hepatitis B Surface Ag: NONREACTIVE

## 2018-05-18 LAB — HEPATITIS A ANTIBODY, TOTAL: Hepatitis A AB,Total: NONREACTIVE

## 2018-05-23 ENCOUNTER — Other Ambulatory Visit: Payer: Self-pay | Admitting: Internal Medicine

## 2018-05-23 DIAGNOSIS — E119 Type 2 diabetes mellitus without complications: Secondary | ICD-10-CM

## 2018-05-23 MED ORDER — METFORMIN HCL ER 500 MG PO TB24: 2000.0000 mg | ORAL_TABLET | Freq: Every day | ORAL | 3 refills | Status: DC

## 2018-06-10 ENCOUNTER — Telehealth: Payer: Self-pay | Admitting: Internal Medicine

## 2018-06-10 NOTE — Telephone Encounter (Signed)
Call pulmonary and try to get him seen for sleep apnea at his scheduled request   Thanks [place]

## 2018-06-10 NOTE — Telephone Encounter (Signed)
His referral was sent to [facility] on 10/25. They have lvm's for him to rtc.

## 2018-06-10 NOTE — Telephone Encounter (Signed)
Pt is wanting to have a sleep study done he stated that it has been over 10 yrs since his last one and feels that it is time to have another. He is wanting one asap because he is out of work for the next week due to his hand

## 2018-06-18 ENCOUNTER — Encounter: Payer: Self-pay | Admitting: Internal Medicine

## 2018-06-18 ENCOUNTER — Ambulatory Visit (INDEPENDENT_AMBULATORY_CARE_PROVIDER_SITE_OTHER): Payer: Commercial Managed Care - PPO | Admitting: Internal Medicine

## 2018-06-18 VITALS — BP 122/72 | HR 71 | Resp 16 | Ht 73.5 in | Wt 254.0 lb

## 2018-06-18 DIAGNOSIS — F17210 Nicotine dependence, cigarettes, uncomplicated: Secondary | ICD-10-CM | POA: Diagnosis not present

## 2018-06-18 DIAGNOSIS — E7849 Other hyperlipidemia: Secondary | ICD-10-CM | POA: Insufficient documentation

## 2018-06-18 DIAGNOSIS — E785 Hyperlipidemia, unspecified: Secondary | ICD-10-CM

## 2018-06-18 DIAGNOSIS — G4719 Other hypersomnia: Secondary | ICD-10-CM

## 2018-06-18 DIAGNOSIS — G4733 Obstructive sleep apnea (adult) (pediatric): Secondary | ICD-10-CM | POA: Diagnosis not present

## 2018-06-18 DIAGNOSIS — E1169 Type 2 diabetes mellitus with other specified complication: Secondary | ICD-10-CM | POA: Insufficient documentation

## 2018-06-18 DIAGNOSIS — Z720 Tobacco use: Secondary | ICD-10-CM

## 2018-06-18 HISTORY — DX: Hyperlipidemia, unspecified: E78.5

## 2018-06-18 NOTE — Patient Instructions (Signed)
Will send for sleep study.  --Quitting smoking is the most important thing that you can do for your health.  --Quitting smoking will have greater affect on your health than any medicine that we can give you.    Sleep Apnea    Sleep apnea is disorder that affects a person's sleep. A person with sleep apnea has abnormal pauses in their breathing when they sleep. It is hard for them to get a good sleep. This makes a person tired during the day. It also can lead to other physical problems. There are three types of sleep apnea. One type is when breathing stops for a short time because your airway is blocked (obstructive sleep apnea). Another type is when the brain sometimes fails to give the normal signal to breathe to the muscles that control your breathing (central sleep apnea). The third type is a combination of the other two types.  HOME CARE   Take all medicine as told by your doctor.  Avoid alcohol, calming medicines (sedatives), and depressant drugs.  Try to lose weight if you are overweight. Talk to your doctor about a healthy weight goal.  Your doctor may have you use a device that helps to open your airway. It can help you get the air that you need. It is called a positive airway pressure (PAP) device.   MAKE SURE YOU:   Understand these instructions.  Will watch your condition.  Will get help right away if you are not doing well or get worse.  It may take approximately 1 month for you to get used to wearing her CPAP every night.  Be sure to work with your machine to get used to it, be patient, it may take time!  If you have trouble tolerating CPAP DO NOT RETURN YOUR MACHINE; Contact our office to see if we can help you tolerate the CPAP better first!    

## 2018-06-18 NOTE — Progress Notes (Signed)
[facility] Consultation      Assessment and Plan:  Obstructive sleep apnea with excessive daytime sleepiness and snoring.  - Currently on CPAP, with some difficulties with his current CPAP machine.  He is interested in starting on a new CPAP machine. - Per insurance requirements we will need to send him for a new sleep study to re-establish a diagnosis of obstructive sleep apnea, then start on CPAP as indicated.  Diabetes mellitus, essential hypertension. - Obstructive sleep apnea can contribute to above conditions, therefore treatment of sleep apnea is important part of their management.  Nicotine abuse. - Continue smoking about half pack a day.  Is not currently interested in quitting, spent 3 minutes in discussion.  Orders Placed This Encounter  Procedures  . Home sleep test   Return in about 3 months (around 09/18/2018).   Date: 06/18/2018  MRN# [id number] Kevin Ryan Sep 29, 1950   Kevin Ryan is a 67 y.o. old male seen in consultation for chief complaint of:    Chief Complaint  Patient presents with  . Consult    Referred by Dr. Chipper Oman. Scocuzza for eval of OSA:  . Sleep Apnea    pt currently on cpap x 10 years.    HPI:   He has CPAP and has been on this one for 8-10 years, and has been on CPAP total for perhaps more than 15 years.  He goes to bed at 1030pm, goes to sleep right away and puts on his cpap. Wakes at 5 am, and generally feels well as long as his CPAP is working. If he sleeps without it, he snores, gasps, and wakes himself up.  No sleep walking, no cataplexy, no sleep paralysis. He has dentures, denies jaw pain.   He is smoking about a half ppd. He is not really thinking of quitting as he enjoys it and feels that he can stop if he wants to.   PMHX:   Past Medical History:  Diagnosis Date  . Abnormal CT of the abdomen   . CAD (coronary artery disease)    s/p MI  . CAD (coronary artery disease)    s/p stent x 1 2004   .  Cancer (HCC)    spindle cell stomach   . Diabetes mellitus without complication ([place])   . 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 Hypercholesterolemia   . Hypertension   . Kidney stones   . Personal history of tobacco use, presenting hazards to health 07/13/2015  . Pneumonia   . 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 BACK SURGERY     01/29/18 [facility] lower  back   . COLONOSCOPY WITH PROPOFOL N/A 03/26/2015   Procedure: COLONOSCOPY WITH PROPOFOL;  Surgeon: Josefine Class, MD;  Location: [facility] ENDOSCOPY;  Service: Endoscopy;  Laterality: N/A;  . KNEE SURGERY    . KNEE SURGERY     b/l partial knee Dr. Leanna Sato   . PARTIAL GASTRECTOMY     2016 [facility]   Family Hx:  Family History  Problem Relation Age of Onset  . Diabetes Mellitus II Maternal Grandfather   . Heart disease Maternal Grandfather   . CAD Paternal Grandfather   . Cancer Paternal Grandfather        ? type  . Arthritis Mother   . Depression Mother   . Hearing loss Mother   . Diabetes Father   .  Cancer Brother        throat smoker   . Hearing loss Maternal Grandmother   . Arthritis Maternal Grandmother    Social Hx:   Social History   Tobacco Use  . Smoking status: Current Every Day Smoker    Packs/day: 1.00    Years: 30.00    Pack years: 30.00    Types: Cigarettes  . Smokeless tobacco: Never Used  Substance Use Topics  . Alcohol use: No  . Drug use: No   Medication:    Current Outpatient Medications:  .  aspirin 81 MG tablet, Take 81 mg by mouth daily., Disp: , Rfl:  .  Cholecalciferol (VITAMIN D3) 2000 units TABS, Take by mouth., Disp: , Rfl:  .  Choline Fenofibrate (FENOFIBRIC ACID) 135 MG CPDR, Take 135 mg by mouth daily. , Disp: , Rfl:  .  clopidogrel (PLAVIX) 75 MG tablet, Take 75 mg by mouth daily., Disp: , Rfl:  .  empagliflozin (JARDIANCE) 10 MG TABS tablet, Take 10 mg by mouth daily. , Disp: , Rfl:  .  lisinopril  (PRINIVIL,ZESTRIL) 2.5 MG tablet, Take 2.5 mg by mouth daily. , Disp: , Rfl:  .  metFORMIN (GLUCOPHAGE-XR) 500 MG 24 hr tablet, Take 4 tablets (2,000 mg total) by mouth daily with breakfast., Disp: 340 tablet, Rfl: 3 .  metoprolol tartrate (LOPRESSOR) 50 MG tablet, Take 1 tablet (50 mg total) by mouth 2 (two) times daily., Disp: 180 tablet, Rfl: 3 .  Omega-3 Fatty Acids (FISH OIL) 1000 MG CAPS, Take 1,000 mg by mouth daily., Disp: , Rfl:  .  Probiotic Product (PROBIOTIC DAILY PO), Take by mouth., Disp: , Rfl:  .  rosuvastatin (CRESTOR) 20 MG tablet, Take 20 mg by mouth daily., Disp: , Rfl:    Allergies:  Ibuprofen  Review of Systems: Gen:  Denies  fever, sweats, chills HEENT: Denies blurred vision, double vision. bleeds, sore throat Cvc:  No dizziness, chest pain. Resp:   Denies cough or sputum production, shortness of breath Gi: Denies swallowing difficulty, stomach pain. Gu:  Denies bladder incontinence, burning urine Ext:   No Joint pain, stiffness. Skin: No skin rash,  hives  Endoc:  No polyuria, polydipsia. Psych: No depression, insomnia. Other:  All other systems were reviewed with the patient and were negative other that what is mentioned in the HPI.   Physical Examination:   VS: BP 122/72 (BP Location: Left Arm, Cuff Size: Large)   Pulse 71   Resp 16   Ht 6' 1.5" (1.867 m)   Wt 254 lb (115.2 kg)   SpO2 98%   BMI 33.06 kg/m   General Appearance: No distress  Neuro:without focal findings,  speech normal,  HEENT: PERRLA, EOM intact.   Pulmonary: normal breath sounds, No wheezing.  CardiovascularNormal S1,S2.  No m/r/g.   Abdomen: Benign, Soft, non-tender. Renal:  No costovertebral tenderness  GU:  No performed at this time. Endoc: No evident thyromegaly, no signs of acromegaly. Skin:   warm, no rashes, no ecchymosis  Extremities: normal, no cyanosis, clubbing.  Other findings:    LABORATORY PANEL:   CBC No results for input(s): WBC, HGB, HCT, PLT in the last  168 hours. ------------------------------------------------------------------------------------------------------------------  Chemistries  No results for input(s): NA, K, CL, CO2, GLUCOSE, BUN, CREATININE, CALCIUM, MG, AST, ALT, ALKPHOS, BILITOT in the last 168 hours.  Invalid input(s): GFRCGP ------------------------------------------------------------------------------------------------------------------  Cardiac Enzymes No results for input(s): TROPONINI in the last 168 hours. ------------------------------------------------------------  RADIOLOGY:  No results found.  Thank  you for the consultation and for allowing [facility] to assist in the care of your patient. Our recommendations are noted above.  Please contact us if we can be of further service.   Marda Stalker, M.D., F.C.C.P.  Board Certified in Internal Medicine, Pulmonary Medicine, [place], and Sleep Medicine.  [facility] Office Number: [phone]   06/18/2018

## 2018-06-20 ENCOUNTER — Other Ambulatory Visit: Payer: Self-pay | Admitting: Internal Medicine

## 2018-06-20 ENCOUNTER — Telehealth: Payer: Self-pay | Admitting: Internal Medicine

## 2018-06-20 DIAGNOSIS — E785 Hyperlipidemia, unspecified: Secondary | ICD-10-CM

## 2018-06-20 MED ORDER — EZETIMIBE 10 MG PO TABS: 10.0000 mg | ORAL_TABLET | Freq: Every day | ORAL | 3 refills | Status: DC

## 2018-06-20 NOTE — Telephone Encounter (Signed)
Call pt want to d/c fenofibric acid for triglycerides and switch to zetia instead with crestor

## 2018-06-21 NOTE — Telephone Encounter (Signed)
He is on statin and fibrate which is not rec today  I rec we change to zetia instead of fibrate, are you ok with this ? Pt is questioning my judgement and states to check with you  Kevin Ryan

## 2018-06-21 NOTE — Telephone Encounter (Signed)
Pt called for instructions per Dr McLean-Scocuzza, "d/c fenofibric acid for triglycerides and switch to zetia instead with crestor; the pt states that per Dr Peggye Form, Cardiology, there should be no changes to his medication; he also states that he will wait for clarification from Dr Tyler Aas before changing medications; will also route to office for final disposition; the pt can be contacted at [phone].

## 2018-06-21 NOTE — Telephone Encounter (Signed)
Unable to leave message for patient to return call back. [facility] may give and obtain information.  

## 2018-06-25 ENCOUNTER — Telehealth: Payer: Self-pay | Admitting: Physician Assistant

## 2018-06-25 ENCOUNTER — Telehealth: Payer: Self-pay | Admitting: Internal Medicine

## 2018-06-25 ENCOUNTER — Ambulatory Visit: Payer: Commercial Managed Care - PPO | Admitting: Internal Medicine

## 2018-06-25 NOTE — Telephone Encounter (Signed)
Fax to Dr. Saralyn Pilar cardiology I need a response from him asap   He is on statin and fibrate which is not rec today  I rec we change to zetia instead of fibrate, are you ok with this ? Pt is questioning my judgement and states to check with you  Kevin Ryan

## 2018-06-26 NOTE — Telephone Encounter (Signed)
Information below has been faxed

## 2018-07-03 DIAGNOSIS — G4733 Obstructive sleep apnea (adult) (pediatric): Secondary | ICD-10-CM

## 2018-07-04 DIAGNOSIS — G4733 Obstructive sleep apnea (adult) (pediatric): Secondary | ICD-10-CM | POA: Diagnosis not present

## 2018-07-05 ENCOUNTER — Telehealth: Payer: Self-pay | Admitting: *Deleted

## 2018-07-05 DIAGNOSIS — G4719 Other hypersomnia: Secondary | ICD-10-CM

## 2018-07-05 DIAGNOSIS — G4733 Obstructive sleep apnea (adult) (pediatric): Secondary | ICD-10-CM

## 2018-07-05 NOTE — Telephone Encounter (Signed)
LMTCB

## 2018-07-08 NOTE — Telephone Encounter (Signed)
Pt aware of results. Orders placed  Nothing further needed. 

## 2018-08-13 NOTE — Addendum Note (Signed)
Addended by: Maryanna Shape A on: 08/13/2018 02:14 PM   Modules accepted: Orders

## 2018-09-16 ENCOUNTER — Ambulatory Visit (INDEPENDENT_AMBULATORY_CARE_PROVIDER_SITE_OTHER): Payer: Commercial Managed Care - PPO | Admitting: Internal Medicine

## 2018-09-16 ENCOUNTER — Encounter: Payer: Self-pay | Admitting: Internal Medicine

## 2018-09-16 VITALS — BP 124/80 | HR 77 | Ht 73.0 in | Wt 250.4 lb

## 2018-09-16 DIAGNOSIS — F17210 Nicotine dependence, cigarettes, uncomplicated: Secondary | ICD-10-CM

## 2018-09-16 DIAGNOSIS — G4733 Obstructive sleep apnea (adult) (pediatric): Secondary | ICD-10-CM | POA: Diagnosis not present

## 2018-09-16 DIAGNOSIS — Z720 Tobacco use: Secondary | ICD-10-CM

## 2018-09-16 NOTE — Progress Notes (Signed)
[facility] Consultation      Assessment and Plan:  Obstructive sleep apnea. - Currently on CPAP with pressure range 5-20.  Having trouble tolerating the lower pressure. - We will change auto CPAP pressure range to 10-20 cm H2O.  Diabetes mellitus, hypertension. - Obstructive sleep apnea can contribute to above conditions, therefore treatment of sleep apnea is important part of their management.  Nicotine abuse. - Continues to smoke about half pack per day, not really interested in quitting at this time.  Spent 3 minutes in discussion.   Return in about 1 year (around 09/17/2019).   Date: 09/16/2018  MRN# [id number] Kevin Ryan 11-01-1950   Kevin Ryan is a 68 y.o. old male seen in consultation for chief complaint of:    Chief Complaint  Patient presents with  . Follow-up    CPAP complaince visit     HPI:  He is doing well with CPAP, he is more awake during the day and using it every night for the whole night. He is not snoring.  He feels that the pressure is too low compared to previous and has trouble initially tolerating it.   He continues to smoke about half ppd, not really thinking of quitting at this time.    **CPAP download 08/13/2018-09/11/2018>> usage greater than 4 hours is 30/30 days.  Average usage on days used 6 hours 41 minutes.  Pressure ranges 5-20.  Median pressure 10, 95th percentile pressure 13, maximum pressure 15.  Leaks are within normal limits.  Residual AHI is 0.5.  Medication:    Current Outpatient Medications:  .  aspirin 81 MG tablet, Take 81 mg by mouth daily., Disp: , Rfl:  .  Cholecalciferol (VITAMIN D3) 2000 units TABS, Take by mouth., Disp: , Rfl:  .  clopidogrel (PLAVIX) 75 MG tablet, Take 75 mg by mouth daily., Disp: , Rfl:  .  empagliflozin (JARDIANCE) 10 MG TABS tablet, Take 10 mg by mouth daily. , Disp: , Rfl:  .  ezetimibe (ZETIA) 10 MG tablet, Take 1 tablet (10 mg total) by mouth daily., Disp: 90 tablet, Rfl:  3 .  lisinopril (PRINIVIL,ZESTRIL) 2.5 MG tablet, Take 2.5 mg by mouth daily. , Disp: , Rfl:  .  metFORMIN (GLUCOPHAGE-XR) 500 MG 24 hr tablet, Take 4 tablets (2,000 mg total) by mouth daily with breakfast., Disp: 340 tablet, Rfl: 3 .  metoprolol tartrate (LOPRESSOR) 50 MG tablet, Take 1 tablet (50 mg total) by mouth 2 (two) times daily., Disp: 180 tablet, Rfl: 3 .  Omega-3 Fatty Acids (FISH OIL) 1000 MG CAPS, Take 1,000 mg by mouth daily., Disp: , Rfl:  .  Probiotic Product (PROBIOTIC DAILY PO), Take by mouth., Disp: , Rfl:  .  rosuvastatin (CRESTOR) 20 MG tablet, Take 20 mg by mouth daily., Disp: , Rfl:    Allergies:  Ibuprofen  Review of Systems:  Constitutional: Feels well. Cardiovascular: Denies chest pain, exertional chest pain.  Pulmonary: Denies hemoptysis, pleuritic chest pain.   The remainder of systems were reviewed and were found to be negative other than what is documented in the HPI.    Physical Examination:   VS: BP 124/80 (BP Location: Left Arm, Cuff Size: Normal)   Pulse 77   Ht 6\' 1"  (1.854 m)   Wt 250 lb 6.4 oz (113.6 kg)   SpO2 97%   BMI 33.04 kg/m   General Appearance: No distress  Neuro:without focal findings, mental status, speech normal, alert and oriented HEENT: PERRLA, EOM intact  Pulmonary: No wheezing, No rales  CardiovascularNormal S1,S2.  No m/r/g.  Abdomen: Benign, Soft, non-tender, No masses Renal:  No costovertebral tenderness  GU:  No performed at this time. Endoc: No evident thyromegaly, no signs of acromegaly or Cushing features Skin:   warm, no rashes, no ecchymosis  Extremities: normal, no cyanosis, clubbing.      LABORATORY PANEL:   CBC No results for input(s): WBC, HGB, HCT, PLT in the last 168 hours. ------------------------------------------------------------------------------------------------------------------  Chemistries  No results for input(s): NA, K, CL, CO2, GLUCOSE, BUN, CREATININE, CALCIUM, MG, AST, ALT, ALKPHOS,  BILITOT in the last 168 hours.  Invalid input(s): GFRCGP ------------------------------------------------------------------------------------------------------------------  Cardiac Enzymes No results for input(s): TROPONINI in the last 168 hours. ------------------------------------------------------------  RADIOLOGY:  No results found.     Thank  you for the consultation and for allowing [facility] to assist in the care of your patient. Our recommendations are noted above.  Please contact us if we can be of further service.   Marda Stalker, M.D., F.C.C.P.  Board Certified in Internal Medicine, Pulmonary Medicine, [place], and Sleep Medicine.  [facility] Office Number: [phone]   09/16/2018

## 2018-09-16 NOTE — Patient Instructions (Addendum)
Continue to use CPAP every night.  Will adjust CPAP auto pressure to new range of 10-20 cm H2O.   --Quitting smoking is the most important thing that you can do for your health.  --Quitting smoking will have greater affect on your health than any medicine that we can give you.

## 2018-09-16 NOTE — Addendum Note (Signed)
Addended by: Maryanna Shape A on: 09/16/2018 03:49 PM   Modules accepted: Orders

## 2018-09-17 ENCOUNTER — Ambulatory Visit: Payer: Commercial Managed Care - PPO | Admitting: Internal Medicine

## 2018-09-17 ENCOUNTER — Encounter: Payer: Self-pay | Admitting: Internal Medicine

## 2018-09-17 VITALS — BP 118/64 | HR 68 | Temp 98.1°F | Ht 73.0 in | Wt 250.2 lb

## 2018-09-17 DIAGNOSIS — C49A2 Gastrointestinal stromal tumor of stomach: Secondary | ICD-10-CM | POA: Insufficient documentation

## 2018-09-17 DIAGNOSIS — I10 Essential (primary) hypertension: Secondary | ICD-10-CM | POA: Diagnosis not present

## 2018-09-17 DIAGNOSIS — E785 Hyperlipidemia, unspecified: Secondary | ICD-10-CM | POA: Diagnosis not present

## 2018-09-17 DIAGNOSIS — H6123 Impacted cerumen, bilateral: Secondary | ICD-10-CM | POA: Diagnosis not present

## 2018-09-17 DIAGNOSIS — R195 Other fecal abnormalities: Secondary | ICD-10-CM

## 2018-09-17 DIAGNOSIS — E119 Type 2 diabetes mellitus without complications: Secondary | ICD-10-CM | POA: Diagnosis not present

## 2018-09-17 DIAGNOSIS — R7989 Other specified abnormal findings of blood chemistry: Secondary | ICD-10-CM | POA: Diagnosis not present

## 2018-09-17 DIAGNOSIS — R42 Dizziness and giddiness: Secondary | ICD-10-CM

## 2018-09-17 DIAGNOSIS — K760 Fatty (change of) liver, not elsewhere classified: Secondary | ICD-10-CM

## 2018-09-17 HISTORY — DX: Dizziness and giddiness: R42

## 2018-09-17 HISTORY — DX: Gastrointestinal stromal tumor of stomach: C49.A2

## 2018-09-17 LAB — COMPREHENSIVE METABOLIC PANEL
ALT: 26 U/L (ref 0–53)
AST: 28 U/L (ref 0–37)
Albumin: 4.6 g/dL (ref 3.5–5.2)
Alkaline Phosphatase: 87 U/L (ref 39–117)
BUN: 21 mg/dL (ref 6–23)
CO2: 29 mEq/L (ref 19–32)
Calcium: 10.2 mg/dL (ref 8.4–10.5)
Chloride: 98 mEq/L (ref 96–112)
Creatinine, Ser: 1.03 mg/dL (ref 0.40–1.50)
GFR: 71.79 mL/min (ref >60.00)
Glucose, Bld: 161 mg/dL — ABNORMAL HIGH (ref 70–99)
Potassium: 4.9 mEq/L (ref 3.5–5.1)
Sodium: 138 mEq/L (ref 135–145)
Total Bilirubin: 0.8 mg/dL (ref 0.2–1.2)
Total Protein: 8 g/dL (ref 6.0–8.3)

## 2018-09-17 LAB — HEMOGLOBIN A1C: Hgb A1c MFr Bld: 8.1 % — ABNORMAL HIGH (ref 4.6–6.5)

## 2018-09-17 LAB — CBC WITH DIFFERENTIAL/PLATELET
Basophils Absolute: 0.1 10*3/uL (ref 0.0–0.1)
Basophils Relative: 0.8 % (ref 0.0–3.0)
Eosinophils Absolute: 0.6 10*3/uL (ref 0.0–0.7)
Eosinophils Relative: 6.4 % — ABNORMAL HIGH (ref 0.0–5.0)
HCT: 50.2 % (ref 39.0–52.0)
Hemoglobin: 16.8 g/dL (ref 13.0–17.0)
Lymphocytes Relative: 28.2 % (ref 12.0–46.0)
Lymphs Abs: 2.8 10*3/uL (ref 0.7–4.0)
MCHC: 33.4 g/dL (ref 30.0–36.0)
MCV: 96.3 fl (ref 78.0–100.0)
Monocytes Absolute: 0.6 10*3/uL (ref 0.1–1.0)
Monocytes Relative: 6 % (ref 3.0–12.0)
Neutro Abs: 5.8 10*3/uL (ref 1.4–7.7)
Neutrophils Relative %: 58.6 % (ref 43.0–77.0)
Platelets: 156 10*3/uL (ref 150.0–400.0)
RBC: 5.21 Mil/uL (ref 4.22–5.81)
RDW: 14.5 % (ref 11.5–15.5)
WBC: 9.8 10*3/uL (ref 4.0–10.5)

## 2018-09-17 LAB — TESTOSTERONE: Testosterone: 186.36 ng/dL — ABNORMAL LOW (ref 300.00–890.00)

## 2018-09-17 LAB — LIPID PANEL
Cholesterol: 123 mg/dL (ref 0–200)
HDL: 33.4 mg/dL — ABNORMAL LOW (ref >39.00)
NonHDL: 89.75
Total CHOL/HDL Ratio: 4
Triglycerides: 329 mg/dL — ABNORMAL HIGH (ref 0.0–149.0)
VLDL: 65.8 mg/dL — ABNORMAL HIGH (ref 0.0–40.0)

## 2018-09-17 LAB — LDL CHOLESTEROL, DIRECT: Direct LDL: 65 mg/dL

## 2018-09-17 MED ORDER — METFORMIN HCL ER 500 MG PO TB24: 500.0000 mg | ORAL_TABLET | Freq: Two times a day (BID) | ORAL | 3 refills | Status: DC

## 2018-09-17 MED ORDER — EMPAGLIFLOZIN 10 MG PO TABS: 10.0000 mg | ORAL_TABLET | Freq: Every day | ORAL | 3 refills | Status: DC

## 2018-09-17 MED ORDER — LISINOPRIL 2.5 MG PO TABS: 2.5000 mg | ORAL_TABLET | Freq: Every day | ORAL | 3 refills | Status: DC

## 2018-09-17 MED ORDER — EZETIMIBE 10 MG PO TABS: 10.0000 mg | ORAL_TABLET | Freq: Every day | ORAL | 3 refills | Status: DC

## 2018-09-17 NOTE — Patient Instructions (Addendum)
If dizziness not better in next 2-4 weeks call me back  Consider MRI   Consider repeat Chest CT for lung nodules with smoking history   Dizziness Dizziness is a common problem. It is a feeling of unsteadiness or light-headedness. You may feel like you are about to faint. Dizziness can lead to injury if you stumble or fall. Anyone can become dizzy, but dizziness is more common in older adults. This condition can be caused by a number of things, including medicines, dehydration, or illness. Follow these instructions at home: Eating and drinking  Drink enough fluid to keep your urine clear or pale yellow. This helps to keep you from becoming dehydrated. Try to drink more clear fluids, such as water.  Do not drink alcohol.  Limit your caffeine intake if told to do so by your health care provider. Check ingredients and nutrition facts to see if a food or beverage contains caffeine.  Limit your salt (sodium) intake if told to do so by your health care provider. Check ingredients and nutrition facts to see if a food or beverage contains sodium. Activity  Avoid making quick movements. ? Rise slowly from chairs and steady yourself until you feel okay. ? In the morning, first sit up on the side of the bed. When you feel okay, stand slowly while you hold onto something until you know that your balance is fine.  If you need to stand in one place for a long time, move your legs often. Tighten and relax the muscles in your legs while you are standing.  Do not drive or use heavy machinery if you feel dizzy.  Avoid bending down if you feel dizzy. Place items in your home so that they are easy for you to reach without leaning over. Lifestyle  Do not use any products that contain nicotine or tobacco, such as cigarettes and e-cigarettes. If you need help quitting, ask your health care provider.  Try to reduce your stress level by using methods such as yoga or meditation. Talk with your health care  provider if you need help to manage your stress. General instructions  Watch your dizziness for any changes.  Take over-the-counter and prescription medicines only as told by your health care provider. Talk with your health care provider if you think that your dizziness is caused by a medicine that you are taking.  Tell a friend or a family member that you are feeling dizzy. If he or she notices any changes in your behavior, have this person call your health care provider.  Keep all follow-up visits as told by your health care provider. This is important. Contact a health care provider if:  Your dizziness does not go away.  Your dizziness or light-headedness gets worse.  You feel nauseous.  You have reduced hearing.  You have new symptoms.  You are unsteady on your feet or you feel like the room is spinning. Get help right away if:  You vomit or have diarrhea and are unable to eat or drink anything.  You have problems talking, walking, swallowing, or using your arms, hands, or legs.  You feel generally weak.  You are not thinking clearly or you have trouble forming sentences. It may take a friend or family member to notice this.  You have chest pain, abdominal pain, shortness of breath, or sweating.  Your vision changes.  You have any bleeding.  You have a severe headache.  You have neck pain or a stiff neck.  You  have a fever. These symptoms may represent a serious problem that is an emergency. Do not wait to see if the symptoms will go away. Get medical help right away. Call your local emergency services (911 in the [place] Do not drive yourself to the hospital. Summary  Dizziness is a feeling of unsteadiness or light-headedness. This condition can be caused by a number of things, including medicines, dehydration, or illness.  Anyone can become dizzy, but dizziness is more common in older adults.  Drink enough fluid to keep your urine clear or pale yellow. Do not  drink alcohol.  Avoid making quick movements if you feel dizzy. Monitor your dizziness for any changes. This information is not intended to replace advice given to you by your health care provider. Make sure you discuss any questions you have with your health care provider. Document Released: 01/10/2001 Document Revised: 08/19/2016 Document Reviewed: 08/19/2016 Elsevier Interactive Patient Education  2019 [employer]  Nonalcoholic Fatty Liver Disease Diet Nonalcoholic fatty liver disease is a condition that causes fat to accumulate in and around the liver. The disease makes it harder for the liver to work the way that it should. Following a healthy diet can help to keep nonalcoholic fatty liver disease under control. It can also help to prevent or improve conditions that are associated with the disease, such as heart disease, diabetes, high blood pressure, and abnormal cholesterol levels. Along with regular exercise, this diet:  Promotes weight loss.  Helps to control blood sugar levels.  Helps to improve the way that the body uses insulin. What do I need to know about this diet?  Use the glycemic index (GI) to plan your meals. The index tells you how quickly a food will raise your blood sugar. Choose low-GI foods. These foods take a longer time to raise blood sugar.  Keep track of how many calories you take in. Eating the right amount of calories will help you to achieve a healthy weight.  You may want to follow a Mediterranean diet. This diet includes a lot of vegetables, lean meats or fish, whole grains, fruits, and healthy oils and fats. What foods can I eat? Grains Whole grains, such as whole-wheat or whole-grain breads, crackers, tortillas, cereals, and pasta. Stone-ground whole wheat. Pumpernickel bread. Unsweetened oatmeal. Bulgur. Barley. Quinoa. Brown or wild rice. Corn or whole-wheat flour tortillas. Vegetables Lettuce. Spinach. Peas. Beets. Cauliflower. Cabbage. Broccoli.  Carrots. Tomatoes. Squash. Eggplant. Herbs. Peppers. Onions. Cucumbers. Brussels sprouts. Yams and sweet potatoes. Beans. Lentils. Fruits Bananas. Apples. Oranges. Grapes. Papaya. Mango. Pomegranate. Kiwi. Grapefruit. Cherries. Meats and Other Protein Sources Seafood and shellfish. Lean meats. Poultry. Tofu. Dairy Low-fat or fat-free dairy products, such as yogurt, cottage cheese, and cheese. Beverages Water. Sugar-free drinks. Tea. Coffee. Low-fat or skim milk. Milk alternatives, such as soy or almond milk. Real fruit juice. Condiments Mustard. Relish. Low-fat, low-sugar ketchup and barbecue sauce. Low-fat or fat-free mayonnaise. Sweets and Desserts Sugar-free sweets. Fats and Oils Avocado. Canola or olive oil. Nuts and nut butters. Seeds. The items listed above may not be a complete list of recommended foods or beverages. Contact your dietitian for more options. What foods are not recommended? Palm oil and coconut oil. Processed foods. Fried foods. Sweetened drinks, such as sweet tea, milkshakes, snow cones, iced sweet drinks, and sodas. Alcohol. Sweets. Foods that contain a lot of salt or sodium. The items listed above may not be a complete list of foods and beverages to avoid. Contact your dietitian for more information. This  information is not intended to replace advice given to you by your health care provider. Make sure you discuss any questions you have with your health care provider. Document Released: 12/01/2014 Document Revised: 12/23/2015 Document Reviewed: 08/11/2014 Elsevier Interactive Patient Education  2019 Elsevier Inc.  Fatty Liver Disease  Fatty liver disease occurs when too much fat has built up in your liver cells. Fatty liver disease is also called hepatic steatosis or steatohepatitis. The liver removes harmful substances from your bloodstream and produces fluids that your body needs. It also helps your body use and store energy from the food you eat. In many cases,  fatty liver disease does not cause symptoms or problems. It is often diagnosed when tests are being done for other reasons. However, over time, fatty liver can cause inflammation that may lead to more serious liver problems, such as scarring of the liver (cirrhosis) and liver failure. Fatty liver is associated with insulin resistance, increased body fat, high blood pressure (hypertension), and high cholesterol. These are features of metabolic syndrome and increase your risk for stroke, diabetes, and heart disease. What are the causes? This condition may be caused by:  Drinking too much alcohol.  Poor nutrition.  Obesity.  Cushing's syndrome.  Diabetes.  High cholesterol.  Certain drugs.  Poisons.  Some viral infections.  Pregnancy. What increases the risk? You are more likely to develop this condition if you:  Abuse alcohol.  Are overweight.  Have diabetes.  Have hepatitis.  Have a high triglyceride level.  Are pregnant. What are the signs or symptoms? Fatty liver disease often does not cause symptoms. If symptoms do develop, they can include:  Fatigue.  Weakness.  Weight loss.  Confusion.  Abdominal pain.  Nausea and vomiting.  Yellowing of your skin and the white parts of your eyes (jaundice).  Itchy skin. How is this diagnosed? This condition may be diagnosed by:  A physical exam and medical history.  Blood tests.  Imaging tests, such as an ultrasound, CT scan, or MRI.  A liver biopsy. A small sample of liver tissue is removed using a needle. The sample is then looked at under a microscope. How is this treated? Fatty liver disease is often caused by other health conditions. Treatment for fatty liver may involve medicines and lifestyle changes to manage conditions such as:  Alcoholism.  High cholesterol.  Diabetes.  Being overweight or obese. Follow these instructions at home:   Do not drink alcohol. If you have trouble quitting, ask  your health care provider how to safely quit with the help of medicine or a supervised program. This is important to keep your condition from getting worse.  Eat a healthy diet as told by your health care provider. Ask your health care provider about working with a diet and nutrition specialist (dietitian) to develop an eating plan.  Exercise regularly. This can help you lose weight and control your cholesterol and diabetes. Talk to your health care provider about an exercise plan and which activities are best for you.  Take over-the-counter and prescription medicines only as told by your health care provider.  Keep all follow-up visits as told by your health care provider. This is important. Contact a health care provider if: You have trouble controlling your:  Blood sugar. This is especially important if you have diabetes.  Cholesterol.  Drinking of alcohol. Get help right away if:  You have abdominal pain.  You have jaundice.  You have nausea and vomiting.  You vomit blood  or material that looks like coffee grounds.  You have stools that are black, tar-like, or bloody. Summary  Fatty liver disease develops when too much fat builds up in the cells of your liver.  Fatty liver disease often causes no symptoms or problems. However, over time, fatty liver can cause inflammation that may lead to more serious liver problems, such as scarring of the liver (cirrhosis).  You are more likely to develop this condition if you abuse alcohol, are pregnant, are overweight, have diabetes, have hepatitis, or have high triglyceride levels.  Contact your health care provider if you have trouble controlling your weight, blood sugar, cholesterol, or drinking of alcohol. This information is not intended to replace advice given to you by your health care provider. Make sure you discuss any questions you have with your health care provider. Document Released: 09/01/2005 Document Revised: 04/25/2017  Document Reviewed: 04/25/2017 Elsevier Interactive Patient Education  2019 [employer].

## 2018-09-17 NOTE — Progress Notes (Signed)
Pre visit review using our clinic review tool, if applicable. No additional management support is needed unless otherwise documented below in the visit note. 

## 2018-09-17 NOTE — Progress Notes (Addendum)
Chief Complaint  Patient presents with  . Follow-up   4 month f/u  1. DM 2 controlled last A1C 6.7 reports cbg in the am 170s higher than 120s he thinks dietary on jardiance 10 mg qd a,d metformin 4000 mg qam  2. C/o very loose stool hard to control in the am see above will reduce metformin  3. Vertigo last 1-2 sec room is not spinning losing balance no n/v wears hearing aids b/l ears 4. Fatty liver declines hep A/B vaccine  5. 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 Caleen Jobs q6 months last seen 07/04/18 no recurrence no mets  Review of Systems  Constitutional: Negative for weight loss.  HENT: Negative for hearing loss.        +hearing loss    Eyes: Negative for blurred vision.  Respiratory: Negative for shortness of breath.   Cardiovascular: Negative for chest pain.  Gastrointestinal: Positive for diarrhea. Negative for nausea and vomiting.  Musculoskeletal: Negative for falls.  Skin: Negative for rash.  Neurological: Positive for dizziness.  Psychiatric/Behavioral: Negative for depression.   Past Medical History:  Diagnosis Date  . Abnormal CT of the abdomen   . CAD (coronary artery disease)    s/p MI  . CAD (coronary artery disease)    s/p stent x 1 2004   . Cancer (HCC)    spindle cell stomach   . Diabetes mellitus without complication ([place])   . 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 Hypercholesterolemia   . Hypertension   . Kidney stones   . Personal history of tobacco use, presenting hazards to health 07/13/2015  . Pneumonia   . 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 BACK SURGERY     01/29/18 [facility] lower  back   . COLONOSCOPY WITH PROPOFOL N/A 03/26/2015   Procedure: COLONOSCOPY WITH PROPOFOL;  Surgeon: Josefine Class, MD;  Location: [facility] ENDOSCOPY;  Service: Endoscopy;  Laterality: N/A;  . KNEE SURGERY    . KNEE SURGERY     b/l partial knee Dr. Leanna Sato   . PARTIAL GASTRECTOMY     2016 [facility]   Family History  Problem Relation Age of Onset  . Diabetes Mellitus II Maternal Grandfather   . Heart disease Maternal Grandfather   . CAD Paternal Grandfather   . Cancer Paternal Grandfather        ? type  . Arthritis Mother   . Depression Mother   . Hearing loss Mother   . Diabetes Father   . Cancer Brother        throat smoker   . Hearing loss Maternal Grandmother   . Arthritis Maternal Grandmother    Social History   Socioeconomic History  . Marital status: Married    Spouse name: Not on file  . Number of children: Not on file  . Years of education: Not on file  . Highest education level: Not on file  Occupational History  . Not on file  Social Needs  . Financial resource strain: Not on file  . Food insecurity:    Worry: Not on file    Inability: Not on file  . Transportation needs:    Medical: Not on file    Non-medical: Not on file  Tobacco Use  . Smoking status: Current Every Day Smoker    Packs/day: 1.00    Years: 30.00    Pack years:  30.00    Types: Cigarettes  . Smokeless tobacco: Never Used  . Tobacco comment: 1 ppd or a little less  Substance and Sexual Activity  . Alcohol use: No  . Drug use: No  . Sexual activity: Yes    Birth control/protection: None  Lifestyle  . Physical activity:    Days per week: Not on file    Minutes per session: Not on file  . Stress: Not on file  Relationships  . Social connections:    Talks on phone: Not on file    Gets together: Not on file    Attends religious service: Not on file    Active member of club or organization: Not on file    Attends meetings of clubs or organizations: Not on file    Relationship status: Not on file  . Intimate partner violence:    Fear of current or ex partner: Not on file    Emotionally abused: Not on file    Physically abused: Not on file    Forced sexual activity: Not on file  Other Topics Concern  . Not on file  Social History Narrative   Owns guns    Wears  seat belt    Safe in relationship    Married 2 sons    On disability    Assoc degree and prev supervisor       Current Meds  Medication Sig  . aspirin 81 MG tablet Take 81 mg by mouth daily.  . Cholecalciferol (VITAMIN D3) 2000 units TABS Take by mouth.  . clopidogrel (PLAVIX) 75 MG tablet Take 75 mg by mouth daily.  . empagliflozin (JARDIANCE) 10 MG TABS tablet Take 10 mg by mouth daily.   Marland Kitchen ezetimibe (ZETIA) 10 MG tablet Take 1 tablet (10 mg total) by mouth daily.  Marland Kitchen lisinopril (PRINIVIL,ZESTRIL) 2.5 MG tablet Take 2.5 mg by mouth daily.   . metFORMIN (GLUCOPHAGE-XR) 500 MG 24 hr tablet Take 4 tablets (2,000 mg total) by mouth daily with breakfast.  . metoprolol tartrate (LOPRESSOR) 50 MG tablet Take 1 tablet (50 mg total) by mouth 2 (two) times daily.  . Omega-3 Fatty Acids (FISH OIL) 1000 MG CAPS Take 1,000 mg by mouth daily.  . Probiotic Product (PROBIOTIC DAILY PO) Take by mouth.  . rosuvastatin (CRESTOR) 20 MG tablet Take 20 mg by mouth daily.   Allergies  Allergen Reactions  . Ibuprofen Itching   No results found for this or any previous visit (from the past 2160 hour(s)). Objective  Body mass index is 33.01 kg/m. Wt Readings from Last 3 Encounters:  09/17/18 250 lb 3.2 oz (113.5 kg)  09/16/18 250 lb 6.4 oz (113.6 kg)  06/18/18 254 lb (115.2 kg)   Temp Readings from Last 3 Encounters:  09/17/18 98.1 F (36.7 C) (Oral)  05/15/18 98.2 F (36.8 C) (Oral)  03/25/18 97.8 F (36.6 C) (Oral)   BP Readings from Last 3 Encounters:  09/17/18 118/64  09/16/18 124/80  06/18/18 122/72   Pulse Readings from Last 3 Encounters:  09/17/18 68  09/16/18 77  06/18/18 71    Physical Exam Vitals signs and nursing note reviewed.  Constitutional:      Appearance: Normal appearance. He is well-developed. He is obese.  HENT:     Head: Normocephalic and atraumatic.     Ears:     Comments: B/l cerumen impaction      Nose: Nose normal.     Mouth/Throat:     Mouth: Mucous  membranes  are moist.     Pharynx: Oropharynx is clear.  Eyes:     Conjunctiva/sclera: Conjunctivae normal.     Pupils: Pupils are equal, round, and reactive to light.  Cardiovascular:     Rate and Rhythm: Normal rate and regular rhythm.     Heart sounds: Normal heart sounds. No murmur.  Pulmonary:     Effort: Pulmonary effort is normal.     Breath sounds: Normal breath sounds.  Skin:    General: Skin is warm and dry.       Neurological:     General: No focal deficit present.     Mental Status: He is alert and oriented to person, place, and time. Mental status is at baseline.     Gait: Gait abnormal.     Comments: H/o foot drop wearing bracing bl legs    Psychiatric:        Attention and Perception: Attention and perception normal.        Mood and Affect: Mood and affect normal.        Speech: Speech normal.        Behavior: Behavior normal. Behavior is cooperative.        Thought Content: Thought content normal.        Cognition and Memory: Cognition and memory normal.        Judgment: Judgment normal.     Assessment   1. DM 2 A1C 6.7  2. Loose stool  3. Vertigo with b/l cerumen impaction  4. Fatty liver  5. Low T and ED  6. H/o GIST tumor  7. HM Plan   1. Cont meds  Reduce metformin to 2 pills in am 1000 mg in am and 500 mg qhs Do foot exam in future  Ask eye exam  Consider pna 23 vaccine   2. See if #1 helps was doing 4000 mg at 1x  3. Cleaned wax out of b/l ears today with currette Pt tolerated ears cleaned and normal Call back if vertigo continues rec MRI brain with IACS in future declines for now  4. Declines hep A/B vaccine  5. Check test today   6. F/u Dr. Caleen Jobs 07/04/18 f/u in 6 months no rec. No mets  7.  Flu shot had at work 05/08/18 Tdap and shingles check [facility] need records  -Tdap no recently record  Consider pna vxs if has not had  rec hepA/hep B pt declines  MMIR immune  H/o colon polyps 03/26/15 polys x 3-4 tubular and  hyperplastic repeat in 3-5 years [facility] PSA normal 05/16/18  H/o lung nodules consider ct chest in future  -smoker age 37 to present max 1.5 ppd now <1 ppd no FH lung cancer rec cessation  -pt declines CT chest today   Consider dermatology in the future Pt to sch fasting labs today   Provider: Dr. [name]-Internal Medicine

## 2018-09-19 NOTE — Progress Notes (Signed)
No tdap but zoster was inputted

## 2018-09-26 ENCOUNTER — Telehealth: Payer: Self-pay | Admitting: Internal Medicine

## 2018-09-26 NOTE — Telephone Encounter (Signed)
Copied from [place] [id number]. Topic: Quick Communication - Rx Refill/Question >> Sep 26, 2018 11:52 AM Scherrie Gerlach wrote: Medication:  glipizide   Pt states he was called and asked if he wanted to restart this med.  Pt states it has not been called into the pharmacy as of yet. Please advise Pt states he has a Rx for jardiance, and he thinks he is to take both of these. Thanks [facility] - [place], [place] - [place] [phone] (Phone) [phone] (Fax)

## 2018-09-27 ENCOUNTER — Other Ambulatory Visit: Payer: Self-pay | Admitting: Internal Medicine

## 2018-09-27 DIAGNOSIS — E1165 Type 2 diabetes mellitus with hyperglycemia: Secondary | ICD-10-CM

## 2018-09-27 MED ORDER — GLIPIZIDE ER 5 MG PO TB24: 5.0000 mg | ORAL_TABLET | Freq: Every day | ORAL | 3 refills | Status: DC

## 2018-09-27 NOTE — Telephone Encounter (Signed)
I want him to take jardiance and glipizide

## 2018-09-30 NOTE — Telephone Encounter (Signed)
Patient has been informed.

## 2019-01-02 ENCOUNTER — Other Ambulatory Visit: Payer: Self-pay | Admitting: Internal Medicine

## 2019-01-02 DIAGNOSIS — E785 Hyperlipidemia, unspecified: Secondary | ICD-10-CM

## 2019-01-02 DIAGNOSIS — E119 Type 2 diabetes mellitus without complications: Secondary | ICD-10-CM

## 2019-01-02 MED ORDER — ROSUVASTATIN CALCIUM 20 MG PO TABS: 20.0000 mg | ORAL_TABLET | Freq: Every day | ORAL | 3 refills | Status: DC

## 2019-02-25 ENCOUNTER — Other Ambulatory Visit: Payer: Self-pay | Admitting: Internal Medicine

## 2019-02-25 DIAGNOSIS — I10 Essential (primary) hypertension: Secondary | ICD-10-CM

## 2019-02-25 MED ORDER — METOPROLOL TARTRATE 50 MG PO TABS: 50.0000 mg | ORAL_TABLET | Freq: Two times a day (BID) | ORAL | 3 refills | Status: DC

## 2019-03-17 ENCOUNTER — Telehealth: Payer: Self-pay | Admitting: Internal Medicine

## 2019-03-17 NOTE — Telephone Encounter (Signed)
Rescheduling the provider is out of the office at that time.

## 2019-03-20 ENCOUNTER — Ambulatory Visit: Payer: Commercial Managed Care - PPO | Admitting: Internal Medicine

## 2019-03-25 ENCOUNTER — Other Ambulatory Visit: Payer: Self-pay | Admitting: Internal Medicine

## 2019-03-25 ENCOUNTER — Telehealth: Payer: Self-pay

## 2019-03-25 DIAGNOSIS — T169XXA Foreign body in ear, unspecified ear, initial encounter: Secondary | ICD-10-CM

## 2019-03-25 NOTE — Telephone Encounter (Signed)
Consulted w/ Dr. Aundra Dubin.  Per Dr. Aundra Dubin patient needs to see an ENT specialist.  Patient aware that Dr. Aundra Dubin will place a referral to [facility].  Patient prefers to see Dr. Beverly Gust.

## 2019-03-25 NOTE — Telephone Encounter (Signed)
Copied from [place] [id number]. Topic: General - Other >> Mar 25, 2019  9:27 AM Leward Quan A wrote: Reason for CRM: Patient called to say that he may have a piece of his hearing aide in his ear and need to come in and have it removed. Please call patient ASAP at Ph#  [phone]

## 2019-05-08 ENCOUNTER — Other Ambulatory Visit: Payer: Self-pay

## 2019-05-13 ENCOUNTER — Ambulatory Visit (INDEPENDENT_AMBULATORY_CARE_PROVIDER_SITE_OTHER): Payer: Commercial Managed Care - PPO | Admitting: Internal Medicine

## 2019-05-13 ENCOUNTER — Encounter: Payer: Self-pay | Admitting: Internal Medicine

## 2019-05-13 ENCOUNTER — Other Ambulatory Visit: Payer: Self-pay

## 2019-05-13 VITALS — BP 142/62 | HR 87 | Temp 97.4°F | Ht 73.0 in | Wt 252.0 lb

## 2019-05-13 DIAGNOSIS — E119 Type 2 diabetes mellitus without complications: Secondary | ICD-10-CM | POA: Diagnosis not present

## 2019-05-13 DIAGNOSIS — R059 Cough, unspecified: Secondary | ICD-10-CM

## 2019-05-13 DIAGNOSIS — Z1329 Encounter for screening for other suspected endocrine disorder: Secondary | ICD-10-CM

## 2019-05-13 DIAGNOSIS — Z1389 Encounter for screening for other disorder: Secondary | ICD-10-CM

## 2019-05-13 DIAGNOSIS — E559 Vitamin D deficiency, unspecified: Secondary | ICD-10-CM

## 2019-05-13 DIAGNOSIS — C49A2 Gastrointestinal stromal tumor of stomach: Secondary | ICD-10-CM

## 2019-05-13 DIAGNOSIS — I10 Essential (primary) hypertension: Secondary | ICD-10-CM | POA: Diagnosis not present

## 2019-05-13 DIAGNOSIS — H269 Unspecified cataract: Secondary | ICD-10-CM

## 2019-05-13 DIAGNOSIS — R42 Dizziness and giddiness: Secondary | ICD-10-CM

## 2019-05-13 DIAGNOSIS — E291 Testicular hypofunction: Secondary | ICD-10-CM

## 2019-05-13 DIAGNOSIS — Z125 Encounter for screening for malignant neoplasm of prostate: Secondary | ICD-10-CM

## 2019-05-13 DIAGNOSIS — R05 Cough: Secondary | ICD-10-CM

## 2019-05-13 NOTE — Patient Instructions (Addendum)
Consider MRI brain and Chest Xray if dizziness and cough not better   Low-Sodium Eating Plan Sodium, which is an element that makes up salt, helps you maintain a healthy balance of fluids in your body. Too much sodium can increase your blood pressure and cause fluid and waste to be held in your body. Your health care provider or dietitian may recommend following this plan if you have high blood pressure (hypertension), kidney disease, liver disease, or heart failure. Eating less sodium can help lower your blood pressure, reduce swelling, and protect your heart, liver, and kidneys. What are tips for following this plan? General guidelines  Most people on this plan should limit their sodium intake to 1,500-2,000 mg (milligrams) of sodium each day. Reading food labels   The Nutrition Facts label lists the amount of sodium in one serving of the food. If you eat more than one serving, you must multiply the listed amount of sodium by the number of servings.  Choose foods with less than 140 mg of sodium per serving.  Avoid foods with 300 mg of sodium or more per serving. Shopping  Look for lower-sodium products, often labeled as "low-sodium" or "no salt added."  Always check the sodium content even if foods are labeled as "unsalted" or "no salt added".  Buy fresh foods. ? Avoid canned foods and premade or frozen meals. ? Avoid canned, cured, or processed meats  Buy breads that have less than 80 mg of sodium per slice. Cooking  Eat more home-cooked food and less restaurant, buffet, and fast food.  Avoid adding salt when cooking. Use salt-free seasonings or herbs instead of table salt or sea salt. Check with your health care provider or pharmacist before using salt substitutes.  Cook with plant-based oils, such as canola, sunflower, or olive oil. Meal planning  When eating at a restaurant, ask that your food be prepared with less salt or no salt, if possible.  Avoid foods that contain  MSG (monosodium glutamate). MSG is sometimes added to [place] food, bouillon, and some canned foods. What foods are recommended? The items listed may not be a complete list. Talk with your dietitian about what dietary choices are best for you. Grains Low-sodium cereals, including oats, puffed wheat and rice, and shredded wheat. Low-sodium crackers. Unsalted rice. Unsalted pasta. Low-sodium bread. Whole-grain breads and whole-grain pasta. Vegetables Fresh or frozen vegetables. "No salt added" canned vegetables. "No salt added" tomato sauce and paste. Low-sodium or reduced-sodium tomato and vegetable juice. Fruits Fresh, frozen, or canned fruit. Fruit juice. Meats and other protein foods Fresh or frozen (no salt added) meat, poultry, seafood, and fish. Low-sodium canned tuna and salmon. Unsalted nuts. Dried peas, beans, and lentils without added salt. Unsalted canned beans. Eggs. Unsalted nut butters. Dairy Milk. Soy milk. Cheese that is naturally low in sodium, such as ricotta cheese, fresh mozzarella, or Swiss cheese Low-sodium or reduced-sodium cheese. Cream cheese. Yogurt. Fats and oils Unsalted butter. Unsalted margarine with no trans fat. Vegetable oils such as canola or olive oils. Seasonings and other foods Fresh and dried herbs and spices. Salt-free seasonings. Low-sodium mustard and ketchup. Sodium-free salad dressing. Sodium-free light mayonnaise. Fresh or refrigerated horseradish. Lemon juice. Vinegar. Homemade, reduced-sodium, or low-sodium soups. Unsalted popcorn and pretzels. Low-salt or salt-free chips. What foods are not recommended? The items listed may not be a complete list. Talk with your dietitian about what dietary choices are best for you. Grains Instant hot cereals. Bread stuffing, pancake, and biscuit mixes. Croutons. Seasoned rice  or pasta mixes. Noodle soup cups. Boxed or frozen macaroni and cheese. Regular salted crackers. Self-rising flour. Vegetables Sauerkraut,  pickled vegetables, and relishes. Olives. [place] fries. Onion rings. Regular canned vegetables (not low-sodium or reduced-sodium). Regular canned tomato sauce and paste (not low-sodium or reduced-sodium). Regular tomato and vegetable juice (not low-sodium or reduced-sodium). Frozen vegetables in sauces. Meats and other protein foods Meat or fish that is salted, canned, smoked, spiced, or pickled. Bacon, ham, sausage, hotdogs, corned beef, chipped beef, packaged lunch meats, salt pork, jerky, pickled herring, anchovies, regular canned tuna, sardines, salted nuts. Dairy Processed cheese and cheese spreads. Cheese curds. Blue cheese. Feta cheese. String cheese. Regular cottage cheese. Buttermilk. Canned milk. Fats and oils Salted butter. Regular margarine. Ghee. Bacon fat. Seasonings and other foods Onion salt, garlic salt, seasoned salt, table salt, and sea salt. Canned and packaged gravies. Worcestershire sauce. Tartar sauce. Barbecue sauce. Teriyaki sauce. Soy sauce, including reduced-sodium. Steak sauce. Fish sauce. Oyster sauce. Cocktail sauce. Horseradish that you find on the shelf. Regular ketchup and mustard. Meat flavorings and tenderizers. Bouillon cubes. Hot sauce and Tabasco sauce. Premade or packaged marinades. Premade or packaged taco seasonings. Relishes. Regular salad dressings. Salsa. Potato and tortilla chips. Corn chips and puffs. Salted popcorn and pretzels. Canned or dried soups. Pizza. Frozen entrees and pot pies. Summary  Eating less sodium can help lower your blood pressure, reduce swelling, and protect your heart, liver, and kidneys.  Most people on this plan should limit their sodium intake to 1,500-2,000 mg (milligrams) of sodium each day.  Canned, boxed, and frozen foods are high in sodium. Restaurant foods, fast foods, and pizza are also very high in sodium. You also get sodium by adding salt to food.  Try to cook at home, eat more fresh fruits and vegetables, and eat less  fast food, canned, processed, or prepared foods. This information is not intended to replace advice given to you by your health care provider. Make sure you discuss any questions you have with your health care provider. Document Released: 01/06/2002 Document Revised: 06/29/2017 Document Reviewed: 07/10/2016 Elsevier Patient Education  2020 [place] DASH stands for "Dietary Approaches to Stop Hypertension." The DASH eating plan is a healthy eating plan that has been shown to reduce high blood pressure (hypertension). It may also reduce your risk for type 2 diabetes, heart disease, and stroke. The DASH eating plan may also help with weight loss. What are tips for following this plan?  General guidelines  Avoid eating more than 2,300 mg (milligrams) of salt (sodium) a day. If you have hypertension, you may need to reduce your sodium intake to 1,500 mg a day.  Limit alcohol intake to no more than 1 drink a day for nonpregnant women and 2 drinks a day for men. One drink equals 12 oz of beer, 5 oz of wine, or 1 oz of hard liquor.  Work with your health care provider to maintain a healthy body weight or to lose weight. Ask what an ideal weight is for you.  Get at least 30 minutes of exercise that causes your heart to beat faster (aerobic exercise) most days of the week. Activities may include walking, swimming, or biking.  Work with your health care provider or diet and nutrition specialist (dietitian) to adjust your eating plan to your individual calorie needs. Reading food labels   Check food labels for the amount of sodium per serving. Choose foods with less than 5 percent of the Daily Value  of sodium. Generally, foods with less than 300 mg of sodium per serving fit into this eating plan.  To find whole grains, look for the word "whole" as the first word in the ingredient list. Shopping  Buy products labeled as "low-sodium" or "no salt added."  Buy fresh foods.  Avoid canned foods and premade or frozen meals. Cooking  Avoid adding salt when cooking. Use salt-free seasonings or herbs instead of table salt or sea salt. Check with your health care provider or pharmacist before using salt substitutes.  Do not fry foods. Cook foods using healthy methods such as baking, boiling, grilling, and broiling instead.  Cook with heart-healthy oils, such as olive, canola, soybean, or sunflower oil. Meal planning  Eat a balanced diet that includes: ? 5 or more servings of fruits and vegetables each day. At each meal, try to fill half of your plate with fruits and vegetables. ? Up to 6-8 servings of whole grains each day. ? Less than 6 oz of lean meat, poultry, or fish each day. A 3-oz serving of meat is about the same size as a deck of cards. One egg equals 1 oz. ? 2 servings of low-fat dairy each day. ? A serving of nuts, seeds, or beans 5 times each week. ? Heart-healthy fats. Healthy fats called Omega-3 fatty acids are found in foods such as flaxseeds and coldwater fish, like sardines, salmon, and mackerel.  Limit how much you eat of the following: ? Canned or prepackaged foods. ? Food that is high in trans fat, such as fried foods. ? Food that is high in saturated fat, such as fatty meat. ? Sweets, desserts, sugary drinks, and other foods with added sugar. ? Full-fat dairy products.  Do not salt foods before eating.  Try to eat at least 2 vegetarian meals each week.  Eat more home-cooked food and less restaurant, buffet, and fast food.  When eating at a restaurant, ask that your food be prepared with less salt or no salt, if possible. What foods are recommended? The items listed may not be a complete list. Talk with your dietitian about what dietary choices are best for you. Grains Whole-grain or whole-wheat bread. Whole-grain or whole-wheat pasta. Brown rice. [place]. Bulgur. Whole-grain and low-sodium cereals. Pita bread. Low-fat, low-sodium  crackers. Whole-wheat flour tortillas. Vegetables Fresh or frozen vegetables (raw, steamed, roasted, or grilled). Low-sodium or reduced-sodium tomato and vegetable juice. Low-sodium or reduced-sodium tomato sauce and tomato paste. Low-sodium or reduced-sodium canned vegetables. Fruits All fresh, dried, or frozen fruit. Canned fruit in natural juice (without added sugar). Meat and other protein foods Skinless chicken or [place]. Ground chicken or [place]. Pork with fat trimmed off. Fish and seafood. Egg whites. Dried beans, peas, or lentils. Unsalted nuts, nut butters, and seeds. Unsalted canned beans. Lean cuts of beef with fat trimmed off. Low-sodium, lean deli meat. Dairy Low-fat (1%) or fat-free (skim) milk. Fat-free, low-fat, or reduced-fat cheeses. Nonfat, low-sodium ricotta or cottage cheese. Low-fat or nonfat yogurt. Low-fat, low-sodium cheese. Fats and oils Soft margarine without trans fats. Vegetable oil. Low-fat, reduced-fat, or light mayonnaise and salad dressings (reduced-sodium). Canola, safflower, olive, soybean, and sunflower oils. Avocado. Seasoning and other foods Herbs. Spices. Seasoning mixes without salt. Unsalted popcorn and pretzels. Fat-free sweets. What foods are not recommended? The items listed may not be a complete list. Talk with your dietitian about what dietary choices are best for you. Grains Baked goods made with fat, such as croissants, muffins, or some breads. Dry  pasta or rice meal packs. Vegetables Creamed or fried vegetables. Vegetables in a cheese sauce. Regular canned vegetables (not low-sodium or reduced-sodium). Regular canned tomato sauce and paste (not low-sodium or reduced-sodium). Regular tomato and vegetable juice (not low-sodium or reduced-sodium). Angie Fava. Olives. Fruits Canned fruit in a light or heavy syrup. Fried fruit. Fruit in cream or butter sauce. Meat and other protein foods Fatty cuts of meat. Ribs. Fried meat. Berniece Salines. Sausage. Bologna and  other processed lunch meats. Salami. Fatback. Hotdogs. Bratwurst. Salted nuts and seeds. Canned beans with added salt. Canned or smoked fish. Whole eggs or egg yolks. Chicken or [place] with skin. Dairy Whole or 2% milk, cream, and half-and-half. Whole or full-fat cream cheese. Whole-fat or sweetened yogurt. Full-fat cheese. Nondairy creamers. Whipped toppings. Processed cheese and cheese spreads. Fats and oils Butter. Stick margarine. Lard. Shortening. Ghee. Bacon fat. Tropical oils, such as coconut, palm kernel, or palm oil. Seasoning and other foods Salted popcorn and pretzels. Onion salt, garlic salt, seasoned salt, table salt, and sea salt. Worcestershire sauce. Tartar sauce. Barbecue sauce. Teriyaki sauce. Soy sauce, including reduced-sodium. Steak sauce. Canned and packaged gravies. Fish sauce. Oyster sauce. Cocktail sauce. Horseradish that you find on the shelf. Ketchup. Mustard. Meat flavorings and tenderizers. Bouillon cubes. Hot sauce and Tabasco sauce. Premade or packaged marinades. Premade or packaged taco seasonings. Relishes. Regular salad dressings. Where to find more information:  National Heart, Lung, and [place]: https://wilson-eaton.com/  American Heart Association: www.heart.org Summary  The DASH eating plan is a healthy eating plan that has been shown to reduce high blood pressure (hypertension). It may also reduce your risk for type 2 diabetes, heart disease, and stroke.  With the DASH eating plan, you should limit salt (sodium) intake to 2,300 mg a day. If you have hypertension, you may need to reduce your sodium intake to 1,500 mg a day.  When on the DASH eating plan, aim to eat more fresh fruits and vegetables, whole grains, lean proteins, low-fat dairy, and heart-healthy fats.  Work with your health care provider or diet and nutrition specialist (dietitian) to adjust your eating plan to your individual calorie needs. This information is not intended to replace advice  given to you by your health care provider. Make sure you discuss any questions you have with your health care provider. Document Released: 07/06/2011 Document Revised: 06/29/2017 Document Reviewed: 07/10/2016 Elsevier Patient Education  2020 [place] 5000 IU daily max  [facility]  [address] [place], [place] [address]  [phone]  Barnetta Chapel, MD  [facility] [address] [place], [place] [address]  [phone]  [phone] ([address])    Helyn Numbers, [place]  [place]  [place], 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 Getting regular exercise is important for everyone. It is especially important if you are overweight. Being overweight increases your risk of heart disease, stroke, diabetes, high blood pressure, and several types of cancer. Reducing your calorie intake and exercising can help you lose weight. Exercise is usually categorized as moderate or vigorous intensity. To lose weight, most people need to do a certain amount of moderate-intensity or vigorous-intensity exercise each week. Moderate-intensity exercise  Moderate-intensity exercise is any activity that gets you  moving enough to burn at least three times more energy (calories) than if you were sitting. Examples of moderate exercise include:  Walking a mile in 15 minutes.  Doing light yard work.  Biking at an easy pace. Most people should get at least 150 minutes (2 hours and 30 minutes) a week of moderate-intensity exercise to maintain their body weight. Vigorous-intensity exercise Vigorous-intensity exercise is any activity that gets you moving enough to burn at least six times more  calories than if you were sitting. When you exercise at this intensity, you should be working hard enough that you are not able to carry on a conversation. Examples of vigorous exercise include:  Running.  Playing a team sport, such as football, basketball, and soccer.  Jumping rope. Most people should get at least 75 minutes (1 hour and 15 minutes) a week of vigorous-intensity exercise to maintain their body weight. How can exercise affect me? When you exercise enough to burn more calories than you eat, you lose weight. Exercise also reduces body fat and builds muscle. The more muscle you have, the more calories you burn. Exercise also:  Improves mood.  Reduces stress and tension.  Improves your overall fitness, flexibility, and endurance.  Increases bone strength. The amount of exercise you need to lose weight depends on:  Your age.  The type of exercise.  Any health conditions you have.  Your overall physical ability. Talk to your health care provider about how much exercise you need and what types of activities are safe for you. What actions can I take to lose weight? Nutrition   Make changes to your diet as told by your health care provider or diet and nutrition specialist (dietitian). This may include: ? Eating fewer calories. ? Eating more protein. ? Eating less unhealthy fats. ? Eating a diet that includes fresh fruits and vegetables, whole grains, low-fat dairy products, and lean protein. ? Avoiding foods with added fat, salt, and sugar.  Drink plenty of water while you exercise to prevent dehydration or heat stroke. Activity  Choose an activity that you enjoy and set realistic goals. Your health care provider can help you make an exercise plan that works for you.  Exercise at a moderate or vigorous intensity most days of the week. ? The intensity of exercise may vary from person to person. You can tell how intense a workout is for you by paying attention to  your breathing and heartbeat. Most people will notice their breathing and heartbeat get faster with more intense exercise.  Do resistance training twice each week, such as: ? Push-ups. ? Sit-ups. ? Lifting weights. ? Using resistance bands.  Getting short amounts of exercise can be just as helpful as long structured periods of exercise. If you have trouble finding time to exercise, try to include exercise in your daily routine. ? Get up, stretch, and walk around every 30 minutes throughout the day. ? Go for a walk during your lunch break. ? Park your car farther away from your destination. ? If you take public transportation, get off one stop early and walk the rest of the way. ? Make phone calls while standing up and walking around. ? Take the stairs instead of elevators or escalators.  Wear comfortable clothes and shoes with good support.  Do not exercise so much that you hurt yourself, feel dizzy, or get very short of breath. Where to find more information  U.S. Department of Health and Human Services: BondedCompany.at  Centers for Disease  Control and Prevention (CDC): http://www.wolf.info/ Contact a health care provider:  Before starting a new exercise program.  If you have questions or concerns about your weight.  If you have a medical problem that keeps you from exercising. Get help right away if you have any of the following while exercising:  Injury.  Dizziness.  Difficulty breathing or shortness of breath that does not go away when you stop exercising.  Chest pain.  Rapid heartbeat. Summary  Being overweight increases your risk of heart disease, stroke, diabetes, high blood pressure, and several types of cancer.  Losing weight happens when you burn more calories than you eat.  Reducing the amount of calories you eat in addition to getting regular moderate or vigorous exercise each week helps you lose weight. This information is not intended to replace advice given to you by  your health care provider. Make sure you discuss any questions you have with your health care provider. Document Released: 08/19/2010 Document Revised: 07/30/2017 Document Reviewed: 07/30/2017 Elsevier Patient Education  [place].   Cholesterol Content in Foods Cholesterol is a waxy, fat-like substance that helps to carry fat in the blood. The body needs cholesterol in small amounts, but too much cholesterol can cause damage to the arteries and heart. Most people should eat less than 200 milligrams (mg) of cholesterol a day. Foods with cholesterol  Cholesterol is found in animal-based foods, such as meat, seafood, and dairy. Generally, low-fat dairy and lean meats have less cholesterol than full-fat dairy and fatty meats. The milligrams of cholesterol per serving (mg per serving) of common cholesterol-containing foods are listed below. Meat and other proteins  Egg -- one large whole egg has 186 mg.  Veal shank -- 4 oz has 141 mg.  Lean ground [place] (93% lean) -- 4 oz has 118 mg.  Fat-trimmed lamb loin -- 4 oz has 106 mg.  Lean ground beef (90% lean) -- 4 oz has 100 mg.  Lobster -- 3.5 oz has 90 mg.  Pork loin chops -- 4 oz has 86 mg.  Canned salmon -- 3.5 oz has 83 mg.  Fat-trimmed beef top loin -- 4 oz has 78 mg.  Frankfurter -- 1 frank (3.5 oz) has 77 mg.  Crab -- 3.5 oz has 71 mg.  Roasted chicken without skin, white meat -- 4 oz has 66 mg.  Light bologna -- 2 oz has 45 mg.  Deli-cut [place] -- 2 oz has 31 mg.  Canned tuna -- 3.5 oz has 31 mg.  Berniece Salines -- 1 oz has 29 mg.  Oysters and mussels (raw) -- 3.5 oz has 25 mg.  Mackerel -- 1 oz has 22 mg.  Trout -- 1 oz has 20 mg.  Pork sausage -- 1 link (1 oz) has 17 mg.  Salmon -- 1 oz has 16 mg.  Tilapia -- 1 oz has 14 mg. Dairy  Soft-serve ice cream --  cup (4 oz) has 103 mg.  Whole-milk yogurt -- 1 cup (8 oz) has 29 mg.  Cheddar cheese -- 1 oz has 28 mg.  American cheese -- 1 oz has 28  mg.  Whole milk -- 1 cup (8 oz) has 23 mg.  2% milk -- 1 cup (8 oz) has 18 mg.  Cream cheese -- 1 tablespoon (Tbsp) has 15 mg.  Cottage cheese --  cup (4 oz) has 14 mg.  Low-fat (1%) milk -- 1 cup (8 oz) has 10 mg.  Sour cream -- 1 Tbsp has 8.5 mg.  Low-fat yogurt -- 1 cup (8 oz) has 8 mg.  Nonfat Greek yogurt -- 1 cup (8 oz) has 7 mg.  Half-and-half cream -- 1 Tbsp has 5 mg. Fats and oils  Cod liver oil -- 1 tablespoon (Tbsp) has 82 mg.  Butter -- 1 Tbsp has 15 mg.  Lard -- 1 Tbsp has 14 mg.  Bacon grease -- 1 Tbsp has 14 mg.  Mayonnaise -- 1 Tbsp has 5-10 mg.  Margarine -- 1 Tbsp has 3-10 mg. Exact amounts of cholesterol in these foods may vary depending on specific ingredients and brands. Foods without cholesterol Most plant-based foods do not have cholesterol unless you combine them with a food that has cholesterol. Foods without cholesterol include:  Grains and cereals.  Vegetables.  Fruits.  Vegetable oils, such as olive, canola, and sunflower oil.  Legumes, such as peas, beans, and lentils.  Nuts and seeds.  Egg whites. Summary  The body needs cholesterol in small amounts, but too much cholesterol can cause damage to the arteries and heart.  Most people should eat less than 200 milligrams (mg) of cholesterol a day. This information is not intended to replace advice given to you by your health care provider. Make sure you discuss any questions you have with your health care provider. Document Released: 03/13/2017 Document Revised: 06/29/2017 Document Reviewed: 03/13/2017 Elsevier Patient Education  [place] DASH stands for "Dietary Approaches to Stop Hypertension." The DASH eating plan is a healthy eating plan that has been shown to reduce high blood pressure (hypertension). It may also reduce your risk for type 2 diabetes, heart disease, and stroke. The DASH eating plan may also help with weight loss. What are tips  for following this plan?  General guidelines  Avoid eating more than 2,300 mg (milligrams) of salt (sodium) a day. If you have hypertension, you may need to reduce your sodium intake to 1,500 mg a day.  Limit alcohol intake to no more than 1 drink a day for nonpregnant women and 2 drinks a day for men. One drink equals 12 oz of beer, 5 oz of wine, or 1 oz of hard liquor.  Work with your health care provider to maintain a healthy body weight or to lose weight. Ask what an ideal weight is for you.  Get at least 30 minutes of exercise that causes your heart to beat faster (aerobic exercise) most days of the week. Activities may include walking, swimming, or biking.  Work with your health care provider or diet and nutrition specialist (dietitian) to adjust your eating plan to your individual calorie needs. Reading food labels   Check food labels for the amount of sodium per serving. Choose foods with less than 5 percent of the Daily Value of sodium. Generally, foods with less than 300 mg of sodium per serving fit into this eating plan.  To find whole grains, look for the word "whole" as the first word in the ingredient list. Shopping  Buy products labeled as "low-sodium" or "no salt added."  Buy fresh foods. Avoid canned foods and premade or frozen meals. Cooking  Avoid adding salt when cooking. Use salt-free seasonings or herbs instead of table salt or sea salt. Check with your health care provider or pharmacist before using salt substitutes.  Do not fry foods. Cook foods using healthy methods such as baking, boiling, grilling, and broiling instead.  Cook with heart-healthy oils, such as olive, canola, soybean, or sunflower oil.  Meal planning  Eat a balanced diet that includes: ? 5 or more servings of fruits and vegetables each day. At each meal, try to fill half of your plate with fruits and vegetables. ? Up to 6-8 servings of whole grains each day. ? Less than 6 oz of lean meat,  poultry, or fish each day. A 3-oz serving of meat is about the same size as a deck of cards. One egg equals 1 oz. ? 2 servings of low-fat dairy each day. ? A serving of nuts, seeds, or beans 5 times each week. ? Heart-healthy fats. Healthy fats called Omega-3 fatty acids are found in foods such as flaxseeds and coldwater fish, like sardines, salmon, and mackerel.  Limit how much you eat of the following: ? Canned or prepackaged foods. ? Food that is high in trans fat, such as fried foods. ? Food that is high in saturated fat, such as fatty meat. ? Sweets, desserts, sugary drinks, and other foods with added sugar. ? Full-fat dairy products.  Do not salt foods before eating.  Try to eat at least 2 vegetarian meals each week.  Eat more home-cooked food and less restaurant, buffet, and fast food.  When eating at a restaurant, ask that your food be prepared with less salt or no salt, if possible. What foods are recommended? The items listed may not be a complete list. Talk with your dietitian about what dietary choices are best for you. Grains Whole-grain or whole-wheat bread. Whole-grain or whole-wheat pasta. Brown rice. [place]. Bulgur. Whole-grain and low-sodium cereals. Pita bread. Low-fat, low-sodium crackers. Whole-wheat flour tortillas. Vegetables Fresh or frozen vegetables (raw, steamed, roasted, or grilled). Low-sodium or reduced-sodium tomato and vegetable juice. Low-sodium or reduced-sodium tomato sauce and tomato paste. Low-sodium or reduced-sodium canned vegetables. Fruits All fresh, dried, or frozen fruit. Canned fruit in natural juice (without added sugar). Meat and other protein foods Skinless chicken or [place]. Ground chicken or [place]. Pork with fat trimmed off. Fish and seafood. Egg whites. Dried beans, peas, or lentils. Unsalted nuts, nut butters, and seeds. Unsalted canned beans. Lean cuts of beef with fat trimmed off. Low-sodium, lean deli meat. Dairy Low-fat  (1%) or fat-free (skim) milk. Fat-free, low-fat, or reduced-fat cheeses. Nonfat, low-sodium ricotta or cottage cheese. Low-fat or nonfat yogurt. Low-fat, low-sodium cheese. Fats and oils Soft margarine without trans fats. Vegetable oil. Low-fat, reduced-fat, or light mayonnaise and salad dressings (reduced-sodium). Canola, safflower, olive, soybean, and sunflower oils. Avocado. Seasoning and other foods Herbs. Spices. Seasoning mixes without salt. Unsalted popcorn and pretzels. Fat-free sweets. What foods are not recommended? The items listed may not be a complete list. Talk with your dietitian about what dietary choices are best for you. Grains Baked goods made with fat, such as croissants, muffins, or some breads. Dry pasta or rice meal packs. Vegetables Creamed or fried vegetables. Vegetables in a cheese sauce. Regular canned vegetables (not low-sodium or reduced-sodium). Regular canned tomato sauce and paste (not low-sodium or reduced-sodium). Regular tomato and vegetable juice (not low-sodium or reduced-sodium). Angie Fava. Olives. Fruits Canned fruit in a light or heavy syrup. Fried fruit. Fruit in cream or butter sauce. Meat and other protein foods Fatty cuts of meat. Ribs. Fried meat. Berniece Salines. Sausage. Bologna and other processed lunch meats. Salami. Fatback. Hotdogs. Bratwurst. Salted nuts and seeds. Canned beans with added salt. Canned or smoked fish. Whole eggs or egg yolks. Chicken or [place] with skin. Dairy Whole or 2% milk, cream, and half-and-half. Whole or full-fat cream cheese. Whole-fat  or sweetened yogurt. Full-fat cheese. Nondairy creamers. Whipped toppings. Processed cheese and cheese spreads. Fats and oils Butter. Stick margarine. Lard. Shortening. Ghee. Bacon fat. Tropical oils, such as coconut, palm kernel, or palm oil. Seasoning and other foods Salted popcorn and pretzels. Onion salt, garlic salt, seasoned salt, table salt, and sea salt. Worcestershire sauce. Tartar sauce.  Barbecue sauce. Teriyaki sauce. Soy sauce, including reduced-sodium. Steak sauce. Canned and packaged gravies. Fish sauce. Oyster sauce. Cocktail sauce. Horseradish that you find on the shelf. Ketchup. Mustard. Meat flavorings and tenderizers. Bouillon cubes. Hot sauce and Tabasco sauce. Premade or packaged marinades. Premade or packaged taco seasonings. Relishes. Regular salad dressings. Where to find more information:  National Heart, Lung, and [place]: https://wilson-eaton.com/  American Heart Association: www.heart.org Summary  The DASH eating plan is a healthy eating plan that has been shown to reduce high blood pressure (hypertension). It may also reduce your risk for type 2 diabetes, heart disease, and stroke.  With the DASH eating plan, you should limit salt (sodium) intake to 2,300 mg a day. If you have hypertension, you may need to reduce your sodium intake to 1,500 mg a day.  When on the DASH eating plan, aim to eat more fresh fruits and vegetables, whole grains, lean proteins, low-fat dairy, and heart-healthy fats.  Work with your health care provider or diet and nutrition specialist (dietitian) to adjust your eating plan to your individual calorie needs. This information is not intended to replace advice given to you by your health care provider. Make sure you discuss any questions you have with your health care provider. Document Released: 07/06/2011 Document Revised: 06/29/2017 Document Reviewed: 07/10/2016 Elsevier Patient Education  [place].  Low-Sodium Eating Plan Sodium, which is an element that makes up salt, helps you maintain a healthy balance of fluids in your body. Too much sodium can increase your blood pressure and cause fluid and waste to be held in your body. Your health care provider or dietitian may recommend following this plan if you have high blood pressure (hypertension), kidney disease, liver disease, or heart failure. Eating less sodium can help  lower your blood pressure, reduce swelling, and protect your heart, liver, and kidneys. What are tips for following this plan? General guidelines  Most people on this plan should limit their sodium intake to 1,500-2,000 mg (milligrams) of sodium each day. Reading food labels   The Nutrition Facts label lists the amount of sodium in one serving of the food. If you eat more than one serving, you must multiply the listed amount of sodium by the number of servings.  Choose foods with less than 140 mg of sodium per serving.  Avoid foods with 300 mg of sodium or more per serving. Shopping  Look for lower-sodium products, often labeled as "low-sodium" or "no salt added."  Always check the sodium content even if foods are labeled as "unsalted" or "no salt added".  Buy fresh foods. ? Avoid canned foods and premade or frozen meals. ? Avoid canned, cured, or processed meats  Buy breads that have less than 80 mg of sodium per slice. Cooking  Eat more home-cooked food and less restaurant, buffet, and fast food.  Avoid adding salt when cooking. Use salt-free seasonings or herbs instead of table salt or sea salt. Check with your health care provider or pharmacist before using salt substitutes.  Cook with plant-based oils, such as canola, sunflower, or olive oil. Meal planning  When eating at a restaurant, ask that your  food be prepared with less salt or no salt, if possible.  Avoid foods that contain MSG (monosodium glutamate). MSG is sometimes added to [place] food, bouillon, and some canned foods. What foods are recommended? The items listed may not be a complete list. Talk with your dietitian about what dietary choices are best for you. Grains Low-sodium cereals, including oats, puffed wheat and rice, and shredded wheat. Low-sodium crackers. Unsalted rice. Unsalted pasta. Low-sodium bread. Whole-grain breads and whole-grain pasta. Vegetables Fresh or frozen vegetables. "No salt added"  canned vegetables. "No salt added" tomato sauce and paste. Low-sodium or reduced-sodium tomato and vegetable juice. Fruits Fresh, frozen, or canned fruit. Fruit juice. Meats and other protein foods Fresh or frozen (no salt added) meat, poultry, seafood, and fish. Low-sodium canned tuna and salmon. Unsalted nuts. Dried peas, beans, and lentils without added salt. Unsalted canned beans. Eggs. Unsalted nut butters. Dairy Milk. Soy milk. Cheese that is naturally low in sodium, such as ricotta cheese, fresh mozzarella, or Swiss cheese Low-sodium or reduced-sodium cheese. Cream cheese. Yogurt. Fats and oils Unsalted butter. Unsalted margarine with no trans fat. Vegetable oils such as canola or olive oils. Seasonings and other foods Fresh and dried herbs and spices. Salt-free seasonings. Low-sodium mustard and ketchup. Sodium-free salad dressing. Sodium-free light mayonnaise. Fresh or refrigerated horseradish. Lemon juice. Vinegar. Homemade, reduced-sodium, or low-sodium soups. Unsalted popcorn and pretzels. Low-salt or salt-free chips. What foods are not recommended? The items listed may not be a complete list. Talk with your dietitian about what dietary choices are best for you. Grains Instant hot cereals. Bread stuffing, pancake, and biscuit mixes. Croutons. Seasoned rice or pasta mixes. Noodle soup cups. Boxed or frozen macaroni and cheese. Regular salted crackers. Self-rising flour. Vegetables Sauerkraut, pickled vegetables, and relishes. Olives. [place] fries. Onion rings. Regular canned vegetables (not low-sodium or reduced-sodium). Regular canned tomato sauce and paste (not low-sodium or reduced-sodium). Regular tomato and vegetable juice (not low-sodium or reduced-sodium). Frozen vegetables in sauces. Meats and other protein foods Meat or fish that is salted, canned, smoked, spiced, or pickled. Bacon, ham, sausage, hotdogs, corned beef, chipped beef, packaged lunch meats, salt pork, jerky, pickled  herring, anchovies, regular canned tuna, sardines, salted nuts. Dairy Processed cheese and cheese spreads. Cheese curds. Blue cheese. Feta cheese. String cheese. Regular cottage cheese. Buttermilk. Canned milk. Fats and oils Salted butter. Regular margarine. Ghee. Bacon fat. Seasonings and other foods Onion salt, garlic salt, seasoned salt, table salt, and sea salt. Canned and packaged gravies. Worcestershire sauce. Tartar sauce. Barbecue sauce. Teriyaki sauce. Soy sauce, including reduced-sodium. Steak sauce. Fish sauce. Oyster sauce. Cocktail sauce. Horseradish that you find on the shelf. Regular ketchup and mustard. Meat flavorings and tenderizers. Bouillon cubes. Hot sauce and Tabasco sauce. Premade or packaged marinades. Premade or packaged taco seasonings. Relishes. Regular salad dressings. Salsa. Potato and tortilla chips. Corn chips and puffs. Salted popcorn and pretzels. Canned or dried soups. Pizza. Frozen entrees and pot pies. Summary  Eating less sodium can help lower your blood pressure, reduce swelling, and protect your heart, liver, and kidneys.  Most people on this plan should limit their sodium intake to 1,500-2,000 mg (milligrams) of sodium each day.  Canned, boxed, and frozen foods are high in sodium. Restaurant foods, fast foods, and pizza are also very high in sodium. You also get sodium by adding salt to food.  Try to cook at home, eat more fresh fruits and vegetables, and eat less fast food, canned, processed, or prepared foods. This information is not  intended to replace advice given to you by your health care provider. Make sure you discuss any questions you have with your health care provider. Document Released: 01/06/2002 Document Revised: 06/29/2017 Document Reviewed: 07/10/2016 Elsevier Patient Education  2020 [employer].

## 2019-05-13 NOTE — Progress Notes (Signed)
Chief Complaint  Patient presents with  . Follow-up   F/u  1. HTN sl elevated on lis 2.5 lopressor 50 mg bid  2. DM 2 uncontrolled last A1C increased to 8.1 jardiance 10 mg qd, glipizide 5 mg xl qd, metformin XR 500 mg qd will check to see if his lot on recall  3. Chronic dizziness x 3-4 years room is not spinning this happens with fast head movements or getting up tried head manuevers w/o relief nothing else tired  4. 04/2019 had URI around grandkids and still has residual cough did not get checked out for this  5. 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 Angelina Ok at [facility] 06/2018 CT ab/pelvis in remission  6. Walking better chronically wears leg braces b/l but walking better s/p back surgery   Review of Systems  Constitutional: Negative for weight loss.  HENT: Negative for hearing loss.   Eyes: Negative for blurred vision.  Respiratory: Positive for cough. Negative for shortness of breath.   Cardiovascular: Negative for chest pain.  Gastrointestinal: Negative for abdominal pain.  Skin: Negative for rash.  Neurological: Negative for headaches.  Psychiatric/Behavioral: Negative for depression.   Past Medical History:  Diagnosis Date  . Abnormal CT of the abdomen   . CAD (coronary artery disease)    s/p MI  . CAD (coronary artery disease)    s/p stent x 1 2004   . Cancer (HCC)    spindle cell stomach   . Diabetes mellitus without complication ([place])   . 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 Hypercholesterolemia   . Hypertension   . Kidney stones   . Personal history of tobacco use, presenting hazards to health 07/13/2015  . Pneumonia   . 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 BACK SURGERY     01/29/18 [facility] lower  back   . COLONOSCOPY WITH PROPOFOL N/A 03/26/2015   Procedure: COLONOSCOPY WITH PROPOFOL;  Surgeon: Josefine Class, MD;  Location: [facility] ENDOSCOPY;  Service: Endoscopy;  Laterality: N/A;  . KNEE SURGERY    . KNEE SURGERY      b/l partial knee Dr. Leanna Sato   . PARTIAL GASTRECTOMY     2016 [facility]   Family History  Problem Relation Age of Onset  . Diabetes Mellitus II Maternal Grandfather   . Heart disease Maternal Grandfather   . CAD Paternal Grandfather   . Cancer Paternal Grandfather        ? type  . Arthritis Mother   . Depression Mother   . Hearing loss Mother   . Diabetes Father   . Cancer Brother        throat smoker   . Hearing loss Maternal Grandmother   . Arthritis Maternal Grandmother    Social History   Socioeconomic History  . Marital status: Married    Spouse name: Not on file  . Number of children: Not on file  . Years of education: Not on file  . Highest education level: Not on file  Occupational History  . Not on file  Social Needs  . Financial resource strain: Not on file  . Food insecurity    Worry: Not on file    Inability: Not on file  . Transportation needs    Medical: Not on file    Non-medical: Not on file  Tobacco Use  . Smoking status: Current Every Day Smoker    Packs/day: 1.00  Years: 30.00    Pack years: 30.00    Types: Cigarettes  . Smokeless tobacco: Never Used  . Tobacco comment: 1 ppd or a little less  Substance and Sexual Activity  . Alcohol use: No  . Drug use: No  . Sexual activity: Yes    Birth control/protection: None  Lifestyle  . Physical activity    Days per week: Not on file    Minutes per session: Not on file  . Stress: Not on file  Relationships  . Social Herbalist on phone: Not on file    Gets together: Not on file    Attends religious service: Not on file    Active member of club or organization: Not on file    Attends meetings of clubs or organizations: Not on file    Relationship status: Not on file  . Intimate partner violence    Fear of current or ex partner: Not on file    Emotionally abused: Not on file    Physically abused: Not on file    Forced sexual activity: Not on file  Other Topics Concern  . Not  on file  Social History Narrative   Owns guns    Wears seat belt    Safe in relationship    Married 2 sons    On disability    Assoc degree and prev supervisor       Current Meds  Medication Sig  . aspirin 81 MG tablet Take 81 mg by mouth daily.  . Cholecalciferol (VITAMIN D3) 2000 units TABS Take by mouth.  . clopidogrel (PLAVIX) 75 MG tablet Take 75 mg by mouth daily.  . empagliflozin (JARDIANCE) 10 MG TABS tablet Take 10 mg by mouth daily.  Marland Kitchen ezetimibe (ZETIA) 10 MG tablet Take 1 tablet (10 mg total) by mouth daily.  Marland Kitchen glipiZIDE (GLIPIZIDE XL) 5 MG 24 hr tablet Take 1 tablet (5 mg total) by mouth daily with breakfast.  . lisinopril (PRINIVIL,ZESTRIL) 2.5 MG tablet Take 1 tablet (2.5 mg total) by mouth daily.  . metFORMIN (GLUCOPHAGE-XR) 500 MG 24 hr tablet Take 1 tablet (500 mg total) by mouth 2 (two) times daily. 2 pills in the am with food and 1 pill at night with food  . metoprolol tartrate (LOPRESSOR) 50 MG tablet Take 1 tablet (50 mg total) by mouth 2 (two) times daily.  . Omega-3 Fatty Acids (FISH OIL) 1000 MG CAPS Take 1,000 mg by mouth daily.  . Probiotic Product (PROBIOTIC DAILY PO) Take by mouth.  . rosuvastatin (CRESTOR) 20 MG tablet Take 1 tablet (20 mg total) by mouth at bedtime.   Allergies  Allergen Reactions  . Ibuprofen Itching   No results found for this or any previous visit (from the past 2160 hour(s)). Objective  Body mass index is 33.25 kg/m. Wt Readings from Last 3 Encounters:  05/13/19 252 lb (114.3 kg)  09/17/18 250 lb 3.2 oz (113.5 kg)  09/16/18 250 lb 6.4 oz (113.6 kg)   Temp Readings from Last 3 Encounters:  05/13/19 (!) 97.4 F (36.3 C) (Skin)  09/17/18 98.1 F (36.7 C) (Oral)  05/15/18 98.2 F (36.8 C) (Oral)   BP Readings from Last 3 Encounters:  05/13/19 (!) 142/62  09/17/18 118/64  09/16/18 124/80   Pulse Readings from Last 3 Encounters:  05/13/19 87  09/17/18 68  09/16/18 77    Physical Exam Vitals signs and nursing note  reviewed.  Constitutional:      Appearance: Normal  appearance. He is well-developed and well-groomed. He is obese.     Comments: +mask on    HENT:     Head: Normocephalic and atraumatic.  Eyes:     Conjunctiva/sclera: Conjunctivae normal.     Pupils: Pupils are equal, round, and reactive to light.  Cardiovascular:     Rate and Rhythm: Normal rate and regular rhythm.     Heart sounds: Normal heart sounds. No murmur.  Pulmonary:     Effort: Pulmonary effort is normal.     Breath sounds: Normal breath sounds.  Skin:    General: Skin is warm and dry.  Neurological:     General: No focal deficit present.     Mental Status: He is alert and oriented to person, place, and time. Mental status is at baseline.     Coordination: Romberg sign negative.  Psychiatric:        Attention and Perception: Attention and perception normal.        Mood and Affect: Mood and affect normal.        Speech: Speech normal.        Behavior: Behavior normal. Behavior is cooperative.        Thought Content: Thought content normal.        Cognition and Memory: Cognition and memory normal.        Judgment: Judgment normal.     Assessment  Plan  Essential hypertension - Plan: Comprehensive metabolic panel, CBC w/Diff, Lipid panel, Urinalysis, Routine w reflex microscopic Cont meds  Monitor BP if BP uncontrolled at f/u change lis 2.5 mg qd to ARB if still coughing with losartan 50 mg dose   Malignant gastrointestinal stromal tumor (GIST) of stomach ([place]) - Plan: Ambulatory referral to Oncology Dr. Micheal Likens  Type 2 diabetes mellitus without complication, without long-term current use of insulin ([place]) - Plan: Urinalysis, Routine w reflex microscopic, Microalbumin / creatinine urine ratio, Hemoglobin A1c rec healthy diet choices  Continue meds  Call to see if his lot of metformin on recall  Pending cataract surgery with [facility]  Foot exam in future   Hypogonadism male - Plan: Testosterone declines  referral test. tx for now  Dizziness If continues rec MRI brain declines today   Cough If continues consider CXR declines today  Change ACEI to ARB   HM Flu shot had at work 10/9/19not had yet 2020  Tdap and shingles [facility] -Tdap no recently record per pt had in 2016 or 2017 in ED  Consider pna vxs if has not had pt agreeable to pna 23 with next upcoming labs 06/2019  rec hepA/hep B pt declines  MMIR immune  H/o colon polyps 03/26/15 polys x 3-4 tubular and hyperplastic repeat in 3-5 years [facility] Dr. Rayann Heman  PSA normal 05/16/18 recheck  H/o lung nodules consider ct chest in future  -smoker age 51 to present max 1.5 ppd now <1 ppd no FH lung cancer rec cessation  -pt declines CT chest today 05/13/2019    Consider dermatology in the futurebut currently no need as of 05/13/2019  Pt to sch fasting labs 06/2019   Provider: Dr. [name]-Internal Medicine

## 2019-05-14 ENCOUNTER — Encounter: Payer: Self-pay | Admitting: Internal Medicine

## 2019-05-21 LAB — HM DIABETES EYE EXAM

## 2019-05-23 ENCOUNTER — Encounter: Payer: Self-pay | Admitting: Internal Medicine

## 2019-05-28 ENCOUNTER — Encounter: Payer: Self-pay | Admitting: *Deleted

## 2019-05-28 ENCOUNTER — Other Ambulatory Visit: Payer: Self-pay

## 2019-05-30 ENCOUNTER — Other Ambulatory Visit: Payer: Self-pay

## 2019-05-30 ENCOUNTER — Other Ambulatory Visit
Admission: RE | Admit: 2019-05-30 | Discharge: 2019-05-30 | Disposition: A | Discharge status: Home / Self Care | Payer: Commercial Managed Care - PPO | Source: Ambulatory Visit | Attending: Ophthalmology | Admitting: Ophthalmology

## 2019-05-30 DIAGNOSIS — Z20828 Contact with and (suspected) exposure to other viral communicable diseases: Secondary | ICD-10-CM | POA: Diagnosis not present

## 2019-05-30 DIAGNOSIS — Z01812 Encounter for preprocedural laboratory examination: Secondary | ICD-10-CM | POA: Diagnosis not present

## 2019-05-30 LAB — SARS CORONAVIRUS 2 (TAT 6-24 HRS): SARS Coronavirus 2: NEGATIVE

## 2019-06-01 NOTE — Anesthesia Preprocedure Evaluation (Addendum)
Anesthesia Evaluation  Patient identified by MRN, date of birth, ID band Patient awake    Reviewed: Allergy & Precautions, NPO status , Patient's Chart, lab work & pertinent test results  History of Anesthesia Complications Negative for: history of anesthetic complications  Airway Mallampati: IV   Neck ROM: Full    Dental  (+) Upper Dentures   Pulmonary sleep apnea and Continuous Positive Airway Pressure Ventilation , Current Smoker (1/2 ppd) and Patient abstained from smoking.,    Pulmonary exam normal breath sounds clear to auscultation       Cardiovascular hypertension, + CAD (s/p MI and stent on Plavix, last dose 06/04/19)  Normal cardiovascular exam Rhythm:Regular Rate:Normal     Neuro/Psych Vertigo     GI/Hepatic Gastric CA   Endo/Other  diabetes, Type 2  Renal/GU Renal disease (nephrolithiasis)     Musculoskeletal  (+) Arthritis ,   Abdominal   Peds  Hematology negative hematology ROS (+)   Anesthesia Other Findings Cardiology note 05/27/19:  Assessment   68 y.o. male with  1. Subendocardial myocardial infarction (CMS-HCC)  2. Coronary artery disease involving native coronary artery of native heart without angina pectoris  3. Type 2 diabetes mellitus without complication, without long-term current use of insulin (CMS-HCC)  4. Hyperlipidemia, unspecified hyperlipidemia type  5. Tobacco abuse   68 year old gentleman with known coronary artery disease, s/p Cypher stent ramus, currently without chest pain, with normal stress echocardiogram 04/29/2015. The patient has essential hypertension, systolic blood pressure mildly elevated on current BP medications.  Plan   1. Continue current medications 2. Counseled patient about low-sodium diet 3. DASH diet printed instructions given to the patient 4. Counseled patient about low-cholesterol diet 5. Continue simvastatin for hyperlipidemia management 6. Low-fat  and cholesterol diet printed instructions given to the patient 7. Encourage patient to stop smoking 8. Return to clinic for follow-up in 6 months  No orders of the defined types were placed in this encounter.  Return in about 6 months (around 11/25/2019).   Reproductive/Obstetrics                            Anesthesia Physical Anesthesia Plan  ASA: III  Anesthesia Plan: MAC   Post-op Pain Management:    Induction: Intravenous  PONV Risk Score and Plan: 0 and TIVA, Midazolam and Treatment may vary due to age or medical condition  Airway Management Planned: Natural Airway  Additional Equipment:   Intra-op Plan:   Post-operative Plan:   Informed Consent: I have reviewed the patients History and Physical, chart, labs and discussed the procedure including the risks, benefits and alternatives for the proposed anesthesia with the patient or authorized representative who has indicated his/her understanding and acceptance.       Plan Discussed with: CRNA  Anesthesia Plan Comments:       Anesthesia Quick Evaluation

## 2019-06-02 NOTE — Discharge Instructions (Signed)

## 2019-06-04 ENCOUNTER — Encounter
Admission: RE | Disposition: A | Discharge status: Home / Self Care | Payer: Self-pay | Source: Home / Self Care | Attending: Ophthalmology

## 2019-06-04 ENCOUNTER — Ambulatory Visit: Payer: Commercial Managed Care - PPO | Admitting: Anesthesiology

## 2019-06-04 ENCOUNTER — Ambulatory Visit
Admission: RE | Admit: 2019-06-04 | Discharge: 2019-06-04 | Disposition: A | Discharge status: Home / Self Care | Payer: Commercial Managed Care - PPO | Attending: Ophthalmology | Admitting: Ophthalmology

## 2019-06-04 ENCOUNTER — Other Ambulatory Visit: Payer: Self-pay

## 2019-06-04 DIAGNOSIS — G4730 Sleep apnea, unspecified: Secondary | ICD-10-CM | POA: Diagnosis not present

## 2019-06-04 DIAGNOSIS — Z955 Presence of coronary angioplasty implant and graft: Secondary | ICD-10-CM | POA: Insufficient documentation

## 2019-06-04 DIAGNOSIS — E119 Type 2 diabetes mellitus without complications: Secondary | ICD-10-CM | POA: Diagnosis not present

## 2019-06-04 DIAGNOSIS — Z96659 Presence of unspecified artificial knee joint: Secondary | ICD-10-CM | POA: Diagnosis not present

## 2019-06-04 DIAGNOSIS — E7800 Pure hypercholesterolemia, unspecified: Secondary | ICD-10-CM | POA: Diagnosis not present

## 2019-06-04 DIAGNOSIS — K760 Fatty (change of) liver, not elsewhere classified: Secondary | ICD-10-CM | POA: Insufficient documentation

## 2019-06-04 DIAGNOSIS — Z79899 Other long term (current) drug therapy: Secondary | ICD-10-CM | POA: Diagnosis not present

## 2019-06-04 DIAGNOSIS — I252 Old myocardial infarction: Secondary | ICD-10-CM | POA: Insufficient documentation

## 2019-06-04 DIAGNOSIS — I2510 Atherosclerotic heart disease of native coronary artery without angina pectoris: Secondary | ICD-10-CM | POA: Insufficient documentation

## 2019-06-04 DIAGNOSIS — Z886 Allergy status to analgesic agent status: Secondary | ICD-10-CM | POA: Diagnosis not present

## 2019-06-04 DIAGNOSIS — H2512 Age-related nuclear cataract, left eye: Secondary | ICD-10-CM | POA: Insufficient documentation

## 2019-06-04 DIAGNOSIS — Z903 Acquired absence of stomach [part of]: Secondary | ICD-10-CM | POA: Insufficient documentation

## 2019-06-04 DIAGNOSIS — I10 Essential (primary) hypertension: Secondary | ICD-10-CM | POA: Insufficient documentation

## 2019-06-04 DIAGNOSIS — Z7984 Long term (current) use of oral hypoglycemic drugs: Secondary | ICD-10-CM | POA: Diagnosis not present

## 2019-06-04 DIAGNOSIS — M1990 Unspecified osteoarthritis, unspecified site: Secondary | ICD-10-CM | POA: Diagnosis not present

## 2019-06-04 DIAGNOSIS — F17210 Nicotine dependence, cigarettes, uncomplicated: Secondary | ICD-10-CM | POA: Diagnosis not present

## 2019-06-04 DIAGNOSIS — Z85028 Personal history of other malignant neoplasm of stomach: Secondary | ICD-10-CM | POA: Diagnosis not present

## 2019-06-04 DIAGNOSIS — Z7901 Long term (current) use of anticoagulants: Secondary | ICD-10-CM | POA: Insufficient documentation

## 2019-06-04 DIAGNOSIS — Z7982 Long term (current) use of aspirin: Secondary | ICD-10-CM | POA: Diagnosis not present

## 2019-06-04 HISTORY — DX: Dizziness and giddiness: R42

## 2019-06-04 HISTORY — DX: Presence of dental prosthetic device (complete) (partial): Z97.2

## 2019-06-04 HISTORY — PX: CATARACT EXTRACTION W/PHACO: SHX586

## 2019-06-04 HISTORY — DX: Sleep apnea, unspecified: G47.30

## 2019-06-04 HISTORY — DX: Unspecified osteoarthritis, unspecified site: M19.90

## 2019-06-04 HISTORY — DX: Presence of external hearing-aid: Z97.4

## 2019-06-04 LAB — GLUCOSE, CAPILLARY
Glucose-Capillary: 153 mg/dL — ABNORMAL HIGH (ref 70–99)
Glucose-Capillary: 138 mg/dL — ABNORMAL HIGH (ref 70–99)

## 2019-06-04 SURGERY — PHACOEMULSIFICATION, CATARACT, WITH IOL INSERTION
Anesthesia: Monitor Anesthesia Care | Site: Eye | Laterality: Left

## 2019-06-04 MED ORDER — LIDOCAINE HCL (PF) 2 % IJ SOLN
INTRAOCULAR | Status: DC | PRN
  Administered 2019-06-04: 1 mL

## 2019-06-04 MED ORDER — MIDAZOLAM HCL 2 MG/2ML IJ SOLN
INTRAMUSCULAR | Status: DC | PRN
  Administered 2019-06-04: 2 mg via INTRAVENOUS

## 2019-06-04 MED ORDER — TETRACAINE HCL 0.5 % OP SOLN
1.0000 [drp] | OPHTHALMIC | Status: DC | PRN
  Administered 2019-06-04 (×3): 1 [drp] via OPHTHALMIC

## 2019-06-04 MED ORDER — CEFUROXIME OPHTHALMIC INJECTION 1 MG/0.1 ML
INJECTION | OPHTHALMIC | Status: DC | PRN
  Administered 2019-06-04: 0.1 mL via INTRACAMERAL

## 2019-06-04 MED ORDER — ACETAMINOPHEN 325 MG PO TABS: 650.0000 mg | ORAL_TABLET | Freq: Once | ORAL | Status: DC | PRN

## 2019-06-04 MED ORDER — ARMC OPHTHALMIC DILATING DROPS
1.0000 "application " | OPHTHALMIC | Status: DC | PRN
  Administered 2019-06-04 (×3): 1 via OPHTHALMIC

## 2019-06-04 MED ORDER — NA HYALUR & NA CHOND-NA HYALUR 0.4-0.35 ML IO KIT
PACK | INTRAOCULAR | Status: DC | PRN
  Administered 2019-06-04: 1 mL via INTRAOCULAR

## 2019-06-04 MED ORDER — FENTANYL CITRATE (PF) 100 MCG/2ML IJ SOLN
INTRAMUSCULAR | Status: DC | PRN
  Administered 2019-06-04 (×2): 50 ug via INTRAVENOUS

## 2019-06-04 MED ORDER — BRIMONIDINE TARTRATE-TIMOLOL 0.2-0.5 % OP SOLN
OPHTHALMIC | Status: DC | PRN
  Administered 2019-06-04: 1 [drp] via OPHTHALMIC

## 2019-06-04 MED ORDER — MOXIFLOXACIN HCL 0.5 % OP SOLN
1.0000 [drp] | OPHTHALMIC | Status: DC | PRN
  Administered 2019-06-04 (×3): 1 [drp] via OPHTHALMIC

## 2019-06-04 MED ORDER — ACETAMINOPHEN 160 MG/5ML PO SOLN: 325.0000 mg | ORAL | Status: DC | PRN

## 2019-06-04 MED ORDER — ONDANSETRON HCL 4 MG/2ML IJ SOLN: 4.0000 mg | Freq: Once | INTRAMUSCULAR | Status: DC | PRN

## 2019-06-04 MED ORDER — EPINEPHRINE PF 1 MG/ML IJ SOLN
INTRAOCULAR | Status: DC | PRN
  Administered 2019-06-04: 61 mL via OPHTHALMIC

## 2019-06-04 SURGICAL SUPPLY — 16 items
CANNULA ANT/CHMB 27G (MISCELLANEOUS) ×1 IMPLANT
CANNULA ANT/CHMB 27GA (MISCELLANEOUS) ×2 IMPLANT
GLOVE SURG LX 7.5 STRW (GLOVE) ×1
GLOVE SURG LX STRL 7.5 STRW (GLOVE) ×1 IMPLANT
GLOVE SURG TRIUMPH 8.0 PF LTX (GLOVE) ×2 IMPLANT
GOWN STRL REUS W/ TWL LRG LVL3 (GOWN DISPOSABLE) ×2 IMPLANT
GOWN STRL REUS W/TWL LRG LVL3 (GOWN DISPOSABLE) ×2
LENS IOL TECNIS ITEC 21.0 (Intraocular Lens) ×1 IMPLANT
MARKER SKIN DUAL TIP RULER LAB (MISCELLANEOUS) ×2 IMPLANT
PACK CATARACT BRASINGTON (MISCELLANEOUS) ×2 IMPLANT
PACK EYE AFTER SURG (MISCELLANEOUS) ×2 IMPLANT
PACK OPTHALMIC (MISCELLANEOUS) ×2 IMPLANT
SYR 3ML LL SCALE MARK (SYRINGE) ×2 IMPLANT
SYR TB 1ML LUER SLIP (SYRINGE) ×2 IMPLANT
WATER STERILE IRR 500ML POUR (IV SOLUTION) ×2 IMPLANT
WIPE NON LINTING 3.25X3.25 (MISCELLANEOUS) ×2 IMPLANT

## 2019-06-04 NOTE — H&P (Signed)

## 2019-06-04 NOTE — Anesthesia Procedure Notes (Signed)
Procedure Name: MAC Date/Time: 06/04/2019 9:46 AM Performed by: Cameron Ali, CRNA Pre-anesthesia Checklist: Patient identified, Emergency Drugs available, Suction available, Timeout performed and Patient being monitored Patient Re-evaluated:Patient Re-evaluated prior to induction Oxygen Delivery Method: Nasal cannula Placement Confirmation: positive ETCO2

## 2019-06-04 NOTE — Op Note (Signed)
OPERATIVE NOTE  JERRETT DURLING [id number] 06/04/2019   PREOPERATIVE DIAGNOSIS:  Nuclear sclerotic cataract left eye. H25.12   POSTOPERATIVE DIAGNOSIS:    Nuclear sclerotic cataract left eye.     PROCEDURE:  Phacoemusification with posterior chamber intraocular lens placement of the left eye  Ultrasound time: Procedure(s) with comments: CATARACT EXTRACTION PHACO AND INTRAOCULAR LENS PLACEMENT (IOC) LEFT DIABETIC 01:08.3  18.6%  12.76 (Left) - diabetic - oral meds sleep apnea  LENS:   Implant Name Type Inv. Item Serial No. Manufacturer Lot No. LRB No. Used Action  LENS IOL DIOP 21.0 - [id number] Intraocular Lens LENS IOL DIOP 21.0 [id number]  Left 1 Implanted      SURGEON:  Wyonia Hough, MD   ANESTHESIA:  Topical with tetracaine drops and 2% Xylocaine jelly, augmented with 1% preservative-free intracameral lidocaine.    COMPLICATIONS:  None.   DESCRIPTION OF PROCEDURE:  The patient was identified in the holding room and transported to the operating room and placed in the supine position under the operating microscope.  The left eye was identified as the operative eye and it was prepped and draped in the usual sterile ophthalmic fashion.   A 1 millimeter clear-corneal paracentesis was made at the 1:30 position.  0.5 ml of preservative-free 1% lidocaine was injected into the anterior chamber.  The anterior chamber was filled with Viscoat viscoelastic.  A 2.4 millimeter keratome was used to make a near-clear corneal incision at the 10:30 position.  .  A curvilinear capsulorrhexis was made with a cystotome and capsulorrhexis forceps.  Balanced salt solution was used to hydrodissect and hydrodelineate the nucleus.   Phacoemulsification was then used in stop and chop fashion to remove the lens nucleus and epinucleus.  The remaining cortex was then removed using the irrigation and aspiration handpiece. Provisc was then placed into the capsular bag to distend it for lens  placement.  A lens was then injected into the capsular bag.  The remaining viscoelastic was aspirated.   Wounds were hydrated with balanced salt solution.  The anterior chamber was inflated to a physiologic pressure with balanced salt solution.  No wound leaks were noted. Cefuroxime 0.1 ml of a 10mg /ml solution was injected into the anterior chamber for a dose of 1 mg of intracameral antibiotic at the completion of the case.    Timolol and Brimonidine drops were applied to the eye.  The patient was taken to the recovery room in stable condition without complications of anesthesia or surgery.  Kalayna Noy 06/04/2019, 10:04 AM

## 2019-06-04 NOTE — Transfer of Care (Signed)
Immediate Anesthesia Transfer of Care Note  Patient: Kevin Ryan  Procedure(s) Performed: CATARACT EXTRACTION PHACO AND INTRAOCULAR LENS PLACEMENT (IOC) LEFT DIABETIC 01:08.3  18.6%  12.76 (Left Eye)  Patient Location: PACU  Anesthesia Type: MAC  Level of Consciousness: awake, alert  and patient cooperative  Airway and Oxygen Therapy: Patient Spontanous Breathing and Patient connected to supplemental oxygen  Post-op Assessment: Post-op Vital signs reviewed, Patient's Cardiovascular Status Stable, Respiratory Function Stable, Patent Airway and No signs of Nausea or vomiting  Post-op Vital Signs: Reviewed and stable  Complications: No apparent anesthesia complications

## 2019-06-04 NOTE — Anesthesia Postprocedure Evaluation (Signed)
Anesthesia Post Note  Patient: Kevin Ryan  Procedure(s) Performed: CATARACT EXTRACTION PHACO AND INTRAOCULAR LENS PLACEMENT (IOC) LEFT DIABETIC 01:08.3  18.6%  12.76 (Left Eye)     Patient location during evaluation: PACU Anesthesia Type: MAC Level of consciousness: awake and alert, oriented and patient cooperative Pain management: pain level controlled Vital Signs Assessment: post-procedure vital signs reviewed and stable Respiratory status: spontaneous breathing, nonlabored ventilation and respiratory function stable Cardiovascular status: blood pressure returned to baseline and stable Postop Assessment: adequate PO intake Anesthetic complications: no    Darrin Nipper

## 2019-06-05 ENCOUNTER — Ambulatory Visit: Payer: Commercial Managed Care - PPO

## 2019-06-05 ENCOUNTER — Other Ambulatory Visit: Payer: Commercial Managed Care - PPO

## 2019-06-05 ENCOUNTER — Encounter: Payer: Self-pay | Admitting: Ophthalmology

## 2019-06-17 ENCOUNTER — Other Ambulatory Visit (INDEPENDENT_AMBULATORY_CARE_PROVIDER_SITE_OTHER): Payer: Commercial Managed Care - PPO

## 2019-06-17 ENCOUNTER — Other Ambulatory Visit: Payer: Self-pay

## 2019-06-17 ENCOUNTER — Ambulatory Visit (INDEPENDENT_AMBULATORY_CARE_PROVIDER_SITE_OTHER): Payer: Commercial Managed Care - PPO

## 2019-06-17 DIAGNOSIS — E559 Vitamin D deficiency, unspecified: Secondary | ICD-10-CM | POA: Diagnosis not present

## 2019-06-17 DIAGNOSIS — Z23 Encounter for immunization: Secondary | ICD-10-CM | POA: Diagnosis not present

## 2019-06-17 DIAGNOSIS — Z1329 Encounter for screening for other suspected endocrine disorder: Secondary | ICD-10-CM

## 2019-06-17 DIAGNOSIS — E119 Type 2 diabetes mellitus without complications: Secondary | ICD-10-CM | POA: Diagnosis not present

## 2019-06-17 DIAGNOSIS — Z125 Encounter for screening for malignant neoplasm of prostate: Secondary | ICD-10-CM

## 2019-06-17 DIAGNOSIS — E291 Testicular hypofunction: Secondary | ICD-10-CM

## 2019-06-17 DIAGNOSIS — I10 Essential (primary) hypertension: Secondary | ICD-10-CM

## 2019-06-17 DIAGNOSIS — Z1389 Encounter for screening for other disorder: Secondary | ICD-10-CM

## 2019-06-17 LAB — CBC WITH DIFFERENTIAL/PLATELET
Basophils Absolute: 0.1 10*3/uL (ref 0.0–0.1)
Basophils Relative: 1 % (ref 0.0–3.0)
Eosinophils Absolute: 0.4 10*3/uL (ref 0.0–0.7)
Eosinophils Relative: 4.2 % (ref 0.0–5.0)
HCT: 49.7 % (ref 39.0–52.0)
Hemoglobin: 16.6 g/dL (ref 13.0–17.0)
Lymphocytes Relative: 27.8 % (ref 12.0–46.0)
Lymphs Abs: 2.4 10*3/uL (ref 0.7–4.0)
MCHC: 33.4 g/dL (ref 30.0–36.0)
MCV: 99 fl (ref 78.0–100.0)
Monocytes Absolute: 0.5 10*3/uL (ref 0.1–1.0)
Monocytes Relative: 5.9 % (ref 3.0–12.0)
Neutro Abs: 5.4 10*3/uL (ref 1.4–7.7)
Neutrophils Relative %: 61.1 % (ref 43.0–77.0)
Platelets: 149 10*3/uL — ABNORMAL LOW (ref 150.0–400.0)
RBC: 5.02 Mil/uL (ref 4.22–5.81)
RDW: 14.3 % (ref 11.5–15.5)
WBC: 8.8 10*3/uL (ref 4.0–10.5)

## 2019-06-17 LAB — LIPID PANEL
Cholesterol: 136 mg/dL (ref 0–200)
HDL: 32.1 mg/dL — ABNORMAL LOW (ref >39.00)
NonHDL: 104.2
Total CHOL/HDL Ratio: 4
Triglycerides: 320 mg/dL — ABNORMAL HIGH (ref 0.0–149.0)
VLDL: 64 mg/dL — ABNORMAL HIGH (ref 0.0–40.0)

## 2019-06-17 LAB — TSH: TSH: 2.47 u[IU]/mL (ref 0.35–4.50)

## 2019-06-17 LAB — PSA: PSA: 0.29 ng/mL (ref 0.10–4.00)

## 2019-06-17 LAB — COMPREHENSIVE METABOLIC PANEL
ALT: 27 U/L (ref 0–53)
AST: 25 U/L (ref 0–37)
Albumin: 4.7 g/dL (ref 3.5–5.2)
Alkaline Phosphatase: 80 U/L (ref 39–117)
BUN: 19 mg/dL (ref 6–23)
CO2: 28 mEq/L (ref 19–32)
Calcium: 9.6 mg/dL (ref 8.4–10.5)
Chloride: 99 mEq/L (ref 96–112)
Creatinine, Ser: 0.91 mg/dL (ref 0.40–1.50)
GFR: 82.64 mL/min (ref >60.00)
Glucose, Bld: 157 mg/dL — ABNORMAL HIGH (ref 70–99)
Potassium: 4.4 mEq/L (ref 3.5–5.1)
Sodium: 137 mEq/L (ref 135–145)
Total Bilirubin: 0.7 mg/dL (ref 0.2–1.2)
Total Protein: 7.6 g/dL (ref 6.0–8.3)

## 2019-06-17 LAB — LDL CHOLESTEROL, DIRECT: Direct LDL: 68 mg/dL

## 2019-06-17 LAB — VITAMIN D 25 HYDROXY (VIT D DEFICIENCY, FRACTURES): VITD: 51.1 ng/mL (ref 30.00–100.00)

## 2019-06-17 LAB — HEMOGLOBIN A1C: Hgb A1c MFr Bld: 7.8 % — ABNORMAL HIGH (ref 4.6–6.5)

## 2019-06-17 LAB — TESTOSTERONE: Testosterone: 169.82 ng/dL — ABNORMAL LOW (ref 300.00–890.00)

## 2019-06-17 NOTE — Progress Notes (Signed)
Kevin Ryan presents today for injection per MD orders. Pnuemococal 23 injection  administered IM in left Upper Arm. Administration without incident. Patient tolerated well.  Shakinah Navis,cma

## 2019-06-18 LAB — URINALYSIS, ROUTINE W REFLEX MICROSCOPIC
Bilirubin Urine: NEGATIVE
Hgb urine dipstick: NEGATIVE
Ketones, ur: NEGATIVE
Leukocytes,Ua: NEGATIVE
Nitrite: NEGATIVE
Protein, ur: NEGATIVE
Specific Gravity, Urine: 1.038 — ABNORMAL HIGH (ref 1.001–1.03)
pH: <=5 (ref 5.0–8.0)

## 2019-06-18 LAB — MICROALBUMIN / CREATININE URINE RATIO
Creatinine, Urine: 88 mg/dL (ref 20–320)
Microalb Creat Ratio: 8 mcg/mg creat (ref <30)
Microalb, Ur: 0.7 mg/dL

## 2019-06-23 ENCOUNTER — Other Ambulatory Visit: Payer: Self-pay | Admitting: Internal Medicine

## 2019-06-23 DIAGNOSIS — E782 Mixed hyperlipidemia: Secondary | ICD-10-CM

## 2019-06-23 DIAGNOSIS — E119 Type 2 diabetes mellitus without complications: Secondary | ICD-10-CM

## 2019-06-23 MED ORDER — OMEGA-3-ACID ETHYL ESTERS 1 G PO CAPS: 2.0000 g | ORAL_CAPSULE | Freq: Two times a day (BID) | ORAL | 3 refills | Status: DC

## 2019-06-23 MED ORDER — JARDIANCE 25 MG PO TABS: 25.0000 mg | ORAL_TABLET | Freq: Every day | ORAL | 3 refills | Status: DC

## 2019-06-25 ENCOUNTER — Other Ambulatory Visit: Payer: Self-pay

## 2019-06-30 ENCOUNTER — Other Ambulatory Visit
Admission: RE | Admit: 2019-06-30 | Discharge: 2019-06-30 | Disposition: A | Discharge status: Home / Self Care | Payer: Commercial Managed Care - PPO | Source: Ambulatory Visit | Attending: Ophthalmology | Admitting: Ophthalmology

## 2019-06-30 ENCOUNTER — Other Ambulatory Visit: Payer: Self-pay

## 2019-06-30 DIAGNOSIS — Z20828 Contact with and (suspected) exposure to other viral communicable diseases: Secondary | ICD-10-CM | POA: Insufficient documentation

## 2019-06-30 DIAGNOSIS — Z01812 Encounter for preprocedural laboratory examination: Secondary | ICD-10-CM | POA: Insufficient documentation

## 2019-07-01 LAB — SARS CORONAVIRUS 2 (TAT 6-24 HRS): SARS Coronavirus 2: NEGATIVE

## 2019-07-01 NOTE — Discharge Instructions (Signed)

## 2019-07-02 ENCOUNTER — Ambulatory Visit: Payer: Commercial Managed Care - PPO | Admitting: Anesthesiology

## 2019-07-02 ENCOUNTER — Encounter
Admission: RE | Disposition: A | Discharge status: Home / Self Care | Payer: Self-pay | Source: Home / Self Care | Attending: Ophthalmology

## 2019-07-02 ENCOUNTER — Other Ambulatory Visit: Payer: Self-pay

## 2019-07-02 ENCOUNTER — Ambulatory Visit
Admission: RE | Admit: 2019-07-02 | Discharge: 2019-07-02 | Disposition: A | Discharge status: Home / Self Care | Payer: Commercial Managed Care - PPO | Attending: Ophthalmology | Admitting: Ophthalmology

## 2019-07-02 DIAGNOSIS — Z85028 Personal history of other malignant neoplasm of stomach: Secondary | ICD-10-CM | POA: Diagnosis not present

## 2019-07-02 DIAGNOSIS — I2510 Atherosclerotic heart disease of native coronary artery without angina pectoris: Secondary | ICD-10-CM | POA: Insufficient documentation

## 2019-07-02 DIAGNOSIS — E1136 Type 2 diabetes mellitus with diabetic cataract: Secondary | ICD-10-CM | POA: Insufficient documentation

## 2019-07-02 DIAGNOSIS — G4730 Sleep apnea, unspecified: Secondary | ICD-10-CM | POA: Diagnosis not present

## 2019-07-02 DIAGNOSIS — I10 Essential (primary) hypertension: Secondary | ICD-10-CM | POA: Insufficient documentation

## 2019-07-02 DIAGNOSIS — F17200 Nicotine dependence, unspecified, uncomplicated: Secondary | ICD-10-CM | POA: Insufficient documentation

## 2019-07-02 DIAGNOSIS — Z955 Presence of coronary angioplasty implant and graft: Secondary | ICD-10-CM | POA: Insufficient documentation

## 2019-07-02 DIAGNOSIS — H2511 Age-related nuclear cataract, right eye: Secondary | ICD-10-CM | POA: Insufficient documentation

## 2019-07-02 DIAGNOSIS — I252 Old myocardial infarction: Secondary | ICD-10-CM | POA: Insufficient documentation

## 2019-07-02 HISTORY — PX: CATARACT EXTRACTION W/PHACO: SHX586

## 2019-07-02 HISTORY — DX: Personal history of urinary calculi: Z87.442

## 2019-07-02 HISTORY — DX: Anemia, unspecified: D64.9

## 2019-07-02 LAB — GLUCOSE, CAPILLARY
Glucose-Capillary: 205 mg/dL — ABNORMAL HIGH (ref 70–99)
Glucose-Capillary: 177 mg/dL — ABNORMAL HIGH (ref 70–99)

## 2019-07-02 SURGERY — PHACOEMULSIFICATION, CATARACT, WITH IOL INSERTION
Anesthesia: Monitor Anesthesia Care | Site: Eye | Laterality: Right

## 2019-07-02 MED ORDER — LIDOCAINE HCL (PF) 2 % IJ SOLN
INTRAOCULAR | Status: DC | PRN
  Administered 2019-07-02: 1 mL

## 2019-07-02 MED ORDER — LACTATED RINGERS IV SOLN: INTRAVENOUS | Status: DC

## 2019-07-02 MED ORDER — NA HYALUR & NA CHOND-NA HYALUR 0.4-0.35 ML IO KIT
PACK | INTRAOCULAR | Status: DC | PRN
  Administered 2019-07-02: 1 mL via INTRAOCULAR

## 2019-07-02 MED ORDER — MIDAZOLAM HCL 2 MG/2ML IJ SOLN
INTRAMUSCULAR | Status: DC | PRN
  Administered 2019-07-02: 2 mg via INTRAVENOUS

## 2019-07-02 MED ORDER — MOXIFLOXACIN HCL 0.5 % OP SOLN
1.0000 [drp] | OPHTHALMIC | Status: DC | PRN
  Administered 2019-07-02 (×3): 1 [drp] via OPHTHALMIC

## 2019-07-02 MED ORDER — ONDANSETRON HCL 4 MG/2ML IJ SOLN: 4.0000 mg | Freq: Once | INTRAMUSCULAR | Status: DC | PRN

## 2019-07-02 MED ORDER — BRIMONIDINE TARTRATE-TIMOLOL 0.2-0.5 % OP SOLN
OPHTHALMIC | Status: DC | PRN
  Administered 2019-07-02: 1 [drp] via OPHTHALMIC

## 2019-07-02 MED ORDER — EPINEPHRINE PF 1 MG/ML IJ SOLN
INTRAOCULAR | Status: DC | PRN
  Administered 2019-07-02: 51 mL via OPHTHALMIC

## 2019-07-02 MED ORDER — ARMC OPHTHALMIC DILATING DROPS
1.0000 "application " | OPHTHALMIC | Status: DC | PRN
  Administered 2019-07-02 (×3): 1 via OPHTHALMIC

## 2019-07-02 MED ORDER — FENTANYL CITRATE (PF) 100 MCG/2ML IJ SOLN
INTRAMUSCULAR | Status: DC | PRN
  Administered 2019-07-02: 100 ug via INTRAVENOUS

## 2019-07-02 MED ORDER — CEFUROXIME OPHTHALMIC INJECTION 1 MG/0.1 ML
INJECTION | OPHTHALMIC | Status: DC | PRN
  Administered 2019-07-02: 0.1 mL via INTRACAMERAL

## 2019-07-02 MED ORDER — TETRACAINE HCL 0.5 % OP SOLN
1.0000 [drp] | OPHTHALMIC | Status: DC | PRN
  Administered 2019-07-02 (×3): 1 [drp] via OPHTHALMIC

## 2019-07-02 SURGICAL SUPPLY — 26 items
CANNULA ANT/CHMB 27G (MISCELLANEOUS) ×1 IMPLANT
CANNULA ANT/CHMB 27GA (MISCELLANEOUS) ×2 IMPLANT
GLOVE SURG LX 7.5 STRW (GLOVE) ×2
GLOVE SURG LX STRL 7.5 STRW (GLOVE) ×1 IMPLANT
GLOVE SURG TRIUMPH 8.0 PF LTX (GLOVE) ×2 IMPLANT
GOWN STRL REUS W/ TWL LRG LVL3 (GOWN DISPOSABLE) ×2 IMPLANT
GOWN STRL REUS W/TWL LRG LVL3 (GOWN DISPOSABLE) ×2
LENS IOL TECNIS ITEC 20.5 (Intraocular Lens) ×1 IMPLANT
MARKER SKIN DUAL TIP RULER LAB (MISCELLANEOUS) ×2 IMPLANT
NDL FILTER BLUNT 18X1 1/2 (NEEDLE) IMPLANT
NDL RETROBULBAR .5 NSTRL (NEEDLE) IMPLANT
NEEDLE FILTER BLUNT 18X 1/2SAF (NEEDLE)
NEEDLE FILTER BLUNT 18X1 1/2 (NEEDLE) IMPLANT
PACK CATARACT BRASINGTON (MISCELLANEOUS) ×2 IMPLANT
PACK EYE AFTER SURG (MISCELLANEOUS) ×2 IMPLANT
PACK OPTHALMIC (MISCELLANEOUS) ×2 IMPLANT
RING MALYGIN 7.0 (MISCELLANEOUS) IMPLANT
SUT ETHILON 10-0 CS-B-6CS-B-6 (SUTURE)
SUT VICRYL  9 0 (SUTURE)
SUT VICRYL 9 0 (SUTURE) IMPLANT
SUTURE EHLN 10-0 CS-B-6CS-B-6 (SUTURE) IMPLANT
SYR 3ML LL SCALE MARK (SYRINGE) ×2 IMPLANT
SYR 5ML LL (SYRINGE) IMPLANT
SYR TB 1ML LUER SLIP (SYRINGE) ×2 IMPLANT
WATER STERILE IRR 500ML POUR (IV SOLUTION) ×2 IMPLANT
WIPE NON LINTING 3.25X3.25 (MISCELLANEOUS) ×2 IMPLANT

## 2019-07-02 NOTE — Transfer of Care (Signed)
Immediate Anesthesia Transfer of Care Note  Patient: Kevin Ryan  Procedure(s) Performed: CATARACT EXTRACTION PHACO AND INTRAOCULAR LENS PLACEMENT (IOC) Right DIABETIC (Right Eye)  Patient Location: PACU  Anesthesia Type: MAC  Level of Consciousness: awake, alert  and patient cooperative  Airway and Oxygen Therapy: Patient Spontanous Breathing and Patient connected to supplemental oxygen  Post-op Assessment: Post-op Vital signs reviewed, Patient's Cardiovascular Status Stable, Respiratory Function Stable, Patent Airway and No signs of Nausea or vomiting  Post-op Vital Signs: Reviewed and stable  Complications: No apparent anesthesia complications

## 2019-07-02 NOTE — Anesthesia Postprocedure Evaluation (Signed)
Anesthesia Post Note  Patient: Kevin Ryan  Procedure(s) Performed: CATARACT EXTRACTION PHACO AND INTRAOCULAR LENS PLACEMENT (IOC) Right DIABETIC (Right Eye)     Patient location during evaluation: PACU Anesthesia Type: MAC Level of consciousness: awake and alert Pain management: pain level controlled Vital Signs Assessment: post-procedure vital signs reviewed and stable Respiratory status: spontaneous breathing, nonlabored ventilation, respiratory function stable and patient connected to nasal cannula oxygen Cardiovascular status: stable and blood pressure returned to baseline Postop Assessment: no apparent nausea or vomiting Anesthetic complications: no    Veda Canning

## 2019-07-02 NOTE — Op Note (Signed)
LOCATION:  [place]   PREOPERATIVE DIAGNOSIS:    Nuclear sclerotic cataract right eye. H25.11   POSTOPERATIVE DIAGNOSIS:  Nuclear sclerotic cataract right eye.     PROCEDURE:  Phacoemusification with posterior chamber intraocular lens placement of the right eye   ULTRASOUND TIME: Procedure(s) with comments: CATARACT EXTRACTION PHACO AND INTRAOCULAR LENS PLACEMENT (IOC) Right DIABETIC (Right) - diabetic-oral med sleep apnea (CPAP)  LENS:   Implant Name Type Inv. Item Serial No. Manufacturer Lot No. LRB No. Used Action  LENS IOL DIOP 20.5 - [id number] Intraocular Lens LENS IOL DIOP 20.5 [id number]  Right 1 Implanted      [place] time: 12% 41 seconds CDE 5.1   SURGEON:  Wyonia Hough, MD   ANESTHESIA:  Topical with tetracaine drops and 2% Xylocaine jelly, augmented with 1% preservative-free intracameral lidocaine.    COMPLICATIONS:  None.   DESCRIPTION OF PROCEDURE:  The patient was identified in the holding room and transported to the operating room and placed in the supine position under the operating microscope.  The right eye was identified as the operative eye and it was prepped and draped in the usual sterile ophthalmic fashion.   A 1 millimeter clear-corneal paracentesis was made at the 12:00 position.  0.5 ml of preservative-free 1% lidocaine was injected into the anterior chamber. The anterior chamber was filled with Viscoat viscoelastic.  A 2.4 millimeter keratome was used to make a near-clear corneal incision at the 9:00 position.  A curvilinear capsulorrhexis was made with a cystotome and capsulorrhexis forceps.  Balanced salt solution was used to hydrodissect and hydrodelineate the nucleus.   Phacoemulsification was then used in stop and chop fashion to remove the lens nucleus and epinucleus.  The remaining cortex was then removed using the irrigation and aspiration handpiece. Provisc was then placed into the capsular bag to distend it for lens placement.   A lens was then injected into the capsular bag.  The remaining viscoelastic was aspirated.   Wounds were hydrated with balanced salt solution.  The anterior chamber was inflated to a physiologic pressure with balanced salt solution.  No wound leaks were noted. Cefuroxime 0.1 ml of a 10mg /ml solution was injected into the anterior chamber for a dose of 1 mg of intracameral antibiotic at the completion of the case.   Timolol and Brimonidine drops were applied to the eye.  The patient was taken to the recovery room in stable condition without complications of anesthesia or surgery.   Verlene Glantz 07/02/2019, 10:15 AM

## 2019-07-02 NOTE — Anesthesia Procedure Notes (Signed)
Procedure Name: MAC Performed by: Izetta Dakin, CRNA Pre-anesthesia Checklist: Timeout performed, Patient being monitored, Patient identified, Emergency Drugs available and Suction available Patient Re-evaluated:Patient Re-evaluated prior to induction Oxygen Delivery Method: Nasal cannula

## 2019-07-02 NOTE — Anesthesia Preprocedure Evaluation (Signed)
Anesthesia Evaluation  Patient identified by MRN, date of birth, ID band Patient awake    Reviewed: Allergy & Precautions, NPO status   History of Anesthesia Complications Negative for: history of anesthetic complications  Airway Mallampati: IV   Neck ROM: Full    Dental  (+) Upper Dentures   Pulmonary sleep apnea and Continuous Positive Airway Pressure Ventilation , Current Smoker and Patient abstained from smoking.,    breath sounds clear to auscultation       Cardiovascular hypertension, + CAD (s/p MI and stent on Plavix, last dose 06/04/19)   Rhythm:Regular Rate:Normal     Neuro/Psych Vertigo     GI/Hepatic Gastric CA   Endo/Other  diabetes, Type 2  Renal/GU Renal disease (nephrolithiasis)     Musculoskeletal  (+) Arthritis ,   Abdominal   Peds  Hematology   Anesthesia Other Findings Cardiology note 05/27/19:  Assessment   68 y.o. male with  1. Subendocardial myocardial infarction (CMS-HCC)  2. Coronary artery disease involving native coronary artery of native heart without angina pectoris  3. Type 2 diabetes mellitus without complication, without long-term current use of insulin (CMS-HCC)  4. Hyperlipidemia, unspecified hyperlipidemia type  5. Tobacco abuse   68 year old gentleman with known coronary artery disease, s/p Cypher stent ramus, currently without chest pain, with normal stress echocardiogram 04/29/2015. The patient has essential hypertension, systolic blood pressure mildly elevated on current BP medications.  Plan   1. Continue current medications 2. Counseled patient about low-sodium diet 3. DASH diet printed instructions given to the patient 4. Counseled patient about low-cholesterol diet 5. Continue simvastatin for hyperlipidemia management 6. Low-fat and cholesterol diet printed instructions given to the patient 7. Encourage patient to stop smoking 8. Return to clinic for follow-up in  6 months  No orders of the defined types were placed in this encounter.  Return in about 6 months (around 11/25/2019).   Reproductive/Obstetrics                             Anesthesia Physical  Anesthesia Plan  ASA: III  Anesthesia Plan: MAC   Post-op Pain Management:    Induction: Intravenous  PONV Risk Score and Plan: 0 and TIVA, Midazolam and Treatment may vary due to age or medical condition  Airway Management Planned: Natural Airway  Additional Equipment:   Intra-op Plan:   Post-operative Plan:   Informed Consent: I have reviewed the patients History and Physical, chart, labs and discussed the procedure including the risks, benefits and alternatives for the proposed anesthesia with the patient or authorized representative who has indicated his/her understanding and acceptance.       Plan Discussed with: CRNA  Anesthesia Plan Comments:         Anesthesia Quick Evaluation

## 2019-07-02 NOTE — H&P (Signed)

## 2019-07-03 ENCOUNTER — Encounter: Payer: Self-pay | Admitting: Ophthalmology

## 2019-09-18 ENCOUNTER — Other Ambulatory Visit: Payer: Self-pay

## 2019-09-18 ENCOUNTER — Encounter: Payer: Self-pay | Admitting: Internal Medicine

## 2019-09-18 ENCOUNTER — Ambulatory Visit (INDEPENDENT_AMBULATORY_CARE_PROVIDER_SITE_OTHER): Payer: Commercial Managed Care - PPO | Admitting: Internal Medicine

## 2019-09-18 VITALS — Ht 72.0 in | Wt 253.0 lb

## 2019-09-18 DIAGNOSIS — R42 Dizziness and giddiness: Secondary | ICD-10-CM

## 2019-09-18 DIAGNOSIS — D696 Thrombocytopenia, unspecified: Secondary | ICD-10-CM

## 2019-09-18 DIAGNOSIS — H814 Vertigo of central origin: Secondary | ICD-10-CM | POA: Diagnosis not present

## 2019-09-18 DIAGNOSIS — W19XXXA Unspecified fall, initial encounter: Secondary | ICD-10-CM | POA: Diagnosis not present

## 2019-09-18 DIAGNOSIS — E785 Hyperlipidemia, unspecified: Secondary | ICD-10-CM

## 2019-09-18 DIAGNOSIS — C49A2 Gastrointestinal stromal tumor of stomach: Secondary | ICD-10-CM

## 2019-09-18 DIAGNOSIS — R2689 Other abnormalities of gait and mobility: Secondary | ICD-10-CM | POA: Diagnosis not present

## 2019-09-18 DIAGNOSIS — C801 Malignant (primary) neoplasm, unspecified: Secondary | ICD-10-CM

## 2019-09-18 DIAGNOSIS — Z79899 Other long term (current) drug therapy: Secondary | ICD-10-CM

## 2019-09-18 DIAGNOSIS — Z720 Tobacco use: Secondary | ICD-10-CM

## 2019-09-18 DIAGNOSIS — E291 Testicular hypofunction: Secondary | ICD-10-CM

## 2019-09-18 DIAGNOSIS — Z9989 Dependence on other enabling machines and devices: Secondary | ICD-10-CM

## 2019-09-18 DIAGNOSIS — E1165 Type 2 diabetes mellitus with hyperglycemia: Secondary | ICD-10-CM

## 2019-09-18 DIAGNOSIS — G4733 Obstructive sleep apnea (adult) (pediatric): Secondary | ICD-10-CM

## 2019-09-18 DIAGNOSIS — R911 Solitary pulmonary nodule: Secondary | ICD-10-CM

## 2019-09-18 NOTE — Progress Notes (Signed)
Telephone Note  I connected with Kevin Ryan  on 09/19/19 at  4:20 PM EST telephone and verified that I am speaking with the correct person using two identifiers.  Location patient: home Location provider:work or home office Persons participating in the virtual visit: patient, provider, pts wife   I discussed the limitations of evaluation and management by telemedicine and the availability of in person appointments. The patient expressed understanding and agreed to proceed.   HPI: 1DM 2 06/17/2019 A1C 7.8 will recheck A1C on glipizide xl 5 mg qd and metformin xr 500 mg 1000 in am and 500 mg qhs and jardiance 25 will recheck labs cbg today 150 last night had crackers before bed  2.fall in the cold weather and tripped and could not get up for hours wife worried about him possibly freezing to death and balance has been off and intermittently dizzy though not dizzy today  Denies LOC and wife states he did not hit his head   3. OSA on cpap and new CPAP machine but wife states he is tired and can fall asleep easily but pt thinks its due to him waking up at 5 am in the am  4. Cough since last visit resolved wife agreeable for him to have CT chest due to h/o smoking and h/o lung nodules to f/u  -will not change ARB for now  5. 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 Caleen Jobs will f/u in 01/07/20 for repeat CT scan and f/u and f/u Q6 months until 2022  6. C/o leg and back surgery now having leg pain which is new but tylenol helps if continues to bother him disc gabapentin could be nerve pain  7. Hypogonadism low T wants urology appt   ROS: See pertinent positives and negatives per HPI. Improved vision since 2020 left and right cataract surgery   Past Medical History:  Diagnosis Date  . Abnormal CT of the abdomen   . Anemia    decreased platelets in last blood work  . 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 CAD (coronary artery disease)    s/p MI  . CAD (coronary artery disease)    s/p stent  x 1 2004   . Cancer (HCC)    spindle cell stomach   . 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 Gastric mass 04/12/2015   cancer; resected  . History of kidney stones   . Hypercholesterolemia   . Hypertension   . Myocardial infarction ([place]) 2004  . Personal history of tobacco use, presenting hazards to health 07/13/2015  . Pneumonia   . Sleep apnea    CPAP  . 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 Vertigo    none in approx 1 yr  . Wears dentures    partial upper  . Wears hearing aid in both ears     Past Surgical History:  Procedure Laterality Date  . BACK SURGERY     01/29/18 [facility] lower  back   . CARDIAC CATHETERIZATION  2004   1 stent  . CARPAL TUNNEL RELEASE Bilateral   . CATARACT EXTRACTION W/PHACO Left 06/04/2019   Procedure: CATARACT EXTRACTION PHACO AND INTRAOCULAR LENS PLACEMENT (IOC) LEFT DIABETIC 01:08.3  18.6%  12.76;  Surgeon: Leandrew Koyanagi, MD;  Location: [place];  Service: Ophthalmology;  Laterality: Left;  diabetic - oral meds sleep apnea  . CATARACT EXTRACTION W/PHACO Right 07/02/2019   Procedure: CATARACT EXTRACTION PHACO AND  INTRAOCULAR LENS PLACEMENT ([place]) Right DIABETIC;  Surgeon: Leandrew Koyanagi, MD;  Location: [place];  Service: Ophthalmology;  Laterality: Right;  diabetic-oral med sleep apnea (CPAP)  . COLONOSCOPY WITH PROPOFOL N/A 03/26/2015   Procedure: COLONOSCOPY WITH PROPOFOL;  Surgeon: Josefine Class, MD;  Location: [facility] ENDOSCOPY;  Service: Endoscopy;  Laterality: N/A;  . KNEE SURGERY     TKR  . KNEE SURGERY     b/l partial knee Dr. Leanna Sato   . PARTIAL GASTRECTOMY     2016 [facility]    Family History  Problem Relation Age of Onset  . Diabetes Mellitus II Maternal Grandfather   . Heart disease Maternal Grandfather   . CAD Paternal Grandfather   . Cancer Paternal Grandfather        ? type  . Arthritis Mother   . Depression Mother   . Hearing loss Mother   . Diabetes Father    . Cancer Brother        throat smoker   . Hearing loss Maternal Grandmother   . Arthritis Maternal Grandmother     SOCIAL HX: married x 44 years    Current Outpatient Medications:  .  aspirin 81 MG tablet, Take 81 mg by mouth daily., Disp: , Rfl:  .  Bioflavonoid Products (ESTER-C) TABS, , Disp: , Rfl:  .  Cholecalciferol (VITAMIN D3) 2000 units TABS, Take by mouth., Disp: , Rfl:  .  clopidogrel (PLAVIX) 75 MG tablet, Take 75 mg by mouth daily., Disp: , Rfl:  .  empagliflozin (JARDIANCE) 25 MG TABS tablet, Take 25 mg by mouth daily before breakfast., Disp: 90 tablet, Rfl: 3 .  ezetimibe (ZETIA) 10 MG tablet, Take 1 tablet (10 mg total) by mouth daily., Disp: 90 tablet, Rfl: 3 .  glipiZIDE (GLIPIZIDE XL) 5 MG 24 hr tablet, Take 1 tablet (5 mg total) by mouth daily with breakfast., Disp: 90 tablet, Rfl: 3 .  lisinopril (PRINIVIL,ZESTRIL) 2.5 MG tablet, Take 1 tablet (2.5 mg total) by mouth daily., Disp: 90 tablet, Rfl: 3 .  MAGNESIUM PO, Take by mouth daily., Disp: , Rfl:  .  meclizine (ANTIVERT) 25 MG tablet, Take 25 mg by mouth 3 (three) times daily as needed for dizziness., Disp: , Rfl:  .  metFORMIN (GLUCOPHAGE-XR) 500 MG 24 hr tablet, Take 1 tablet (500 mg total) by mouth 2 (two) times daily. 2 pills in the am with food and 1 pill at night with food, Disp: 270 tablet, Rfl: 3 .  metoprolol tartrate (LOPRESSOR) 50 MG tablet, Take 1 tablet (50 mg total) by mouth 2 (two) times daily., Disp: 180 tablet, Rfl: 3 .  omega-3 acid ethyl esters (LOVAZA) 1 g capsule, Take 2 capsules (2 g total) by mouth 2 (two) times daily., Disp: 360 capsule, Rfl: 3 .  Omega-3 Fatty Acids (FISH OIL) 1000 MG CAPS, Take 1,000 mg by mouth daily., Disp: , Rfl:  .  Probiotic Product (PROBIOTIC DAILY PO), Take by mouth., Disp: , Rfl:  .  rosuvastatin (CRESTOR) 20 MG tablet, Take 1 tablet (20 mg total) by mouth at bedtime., Disp: 90 tablet, Rfl: 3 .  vitamin B-12 (CYANOCOBALAMIN) 500 MCG tablet, Take 5,000 mcg by mouth  daily. , Disp: , Rfl:   EXAM:  VITALS per patient if applicable:  GENERAL: alert, oriented, appears well and in no acute distress  PSYCH/NEURO: pleasant and cooperative, no obvious depression or anxiety, speech and thought processing grossly intact  ASSESSMENT AND PLAN:  Discussed the following assessment and plan:  Dizziness - Plan: CT Head Wo Contrast, Ambulatory referral to Neurology Imbalance - Plan: CT Head Wo Contrast, Ambulatory referral to Neurology Vertigo - Plan: CT Head Wo Contrast, Ambulatory referral to Neurology Fall, initial encounter - Plan: CT Head Wo Contrast, Ambulatory referral to Neurology Vertigo of central origin - Plan: CT Head Wo Contrast, Ambulatory referral to Neurology  OSA on CPAP  Lung nodule - Plan: CT Chest Wo Contrast Tobacco abuse - Plan: CT Chest Wo Contrast -rec cessation   Type 2 diabetes mellitus with hyperglycemia, without long-term current use of insulin (HCC) - Plan: Comprehensive metabolic panel, Lipid panel, Hemoglobin A1c, CBC w/Diff Cont meds  sch fasting labs Foot exam in future   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 Caleen Jobs   Hypogonadism in male - Plan: Ambulatory referral to Urology  Thrombocytopenia [facility]) - Plan: CBC w/Diff  HM Flu shot had at work 10/9/19not had yet 2020  Tdap and shingles check [facility] -Tdap no recently recordper pt had in 2016 or 2017 in ED  prevnar utd  pna 23 utd  covid 19 consider in future  Consider shingrix  rec hepA/hep Bpt declines  MMIR immune  H/o colon polyps 03/26/15 polys x 3-4 tubular and hyperplastic repeat in 3-5 years [facility] Dr. Rayann Heman  PSA normal 0.29 06/17/2019  H/o lung nodules consider ct chest in future  -smoker age 58 to present max 1.5 ppd now <1 ppd no FH lung cancer rec cessation -pt declines CT chest today 05/13/2019 and  declines 09/18/19  Consider dermatology in the futurebut currently no need as of 05/13/2019  Pt to sch fasting labs11/2020  H/o ried[facility] appt 12/2019   -we discussed possible serious and likely etiologies, options for evaluation and workup, limitations of telemedicine visit vs in person visit, treatment, treatment risks and precautions. Pt prefers to treat via telemedicine empirically rather then risking or undertaking an in person visit at this moment. Patient agrees to seek prompt in person care if worsening, new symptoms arise, or if is not improving with treatment.   I discussed the assessment and treatment plan with the patient. The patient was provided an opportunity to ask questions and all were answered. The patient agreed with the plan and demonstrated an understanding of the instructions.   The patient was advised to call back or seek an in-person evaluation if the symptoms worsen or if the condition fails to improve as anticipated.  Time spent 30 minutes  Delorise Jackson, MD

## 2019-09-18 NOTE — Patient Instructions (Addendum)
COVID-19 Vaccine Information can be found at: [email or web address].uk For questions related to vaccine distribution or appointments, please email [email or web address] or call [phone].  COVID 19  [phone] in [place]   [phone] in [place]  [phone] in [place]

## 2019-09-19 ENCOUNTER — Encounter: Payer: Self-pay | Admitting: Internal Medicine

## 2019-09-19 DIAGNOSIS — G4733 Obstructive sleep apnea (adult) (pediatric): Secondary | ICD-10-CM | POA: Insufficient documentation

## 2019-09-19 DIAGNOSIS — Z9989 Dependence on other enabling machines and devices: Secondary | ICD-10-CM | POA: Insufficient documentation

## 2019-09-24 ENCOUNTER — Other Ambulatory Visit: Payer: Self-pay | Admitting: Internal Medicine

## 2019-09-24 DIAGNOSIS — E119 Type 2 diabetes mellitus without complications: Secondary | ICD-10-CM

## 2019-09-24 DIAGNOSIS — E1165 Type 2 diabetes mellitus with hyperglycemia: Secondary | ICD-10-CM

## 2019-09-24 DIAGNOSIS — I10 Essential (primary) hypertension: Secondary | ICD-10-CM

## 2019-09-24 MED ORDER — LISINOPRIL 2.5 MG PO TABS: 2.5000 mg | ORAL_TABLET | Freq: Every day | ORAL | 3 refills | Status: DC

## 2019-09-24 MED ORDER — GLIPIZIDE ER 5 MG PO TB24: 5.0000 mg | ORAL_TABLET | Freq: Every day | ORAL | 3 refills | Status: DC

## 2019-09-24 MED ORDER — METFORMIN HCL ER 500 MG PO TB24: 500.0000 mg | ORAL_TABLET | Freq: Two times a day (BID) | ORAL | 3 refills | Status: DC

## 2019-09-29 ENCOUNTER — Other Ambulatory Visit: Payer: Medicare Other

## 2019-10-07 ENCOUNTER — Other Ambulatory Visit: Payer: Self-pay | Admitting: Internal Medicine

## 2019-10-07 DIAGNOSIS — E785 Hyperlipidemia, unspecified: Secondary | ICD-10-CM

## 2019-10-07 MED ORDER — EZETIMIBE 10 MG PO TABS: 10.0000 mg | ORAL_TABLET | Freq: Every day | ORAL | 3 refills | Status: DC

## 2019-10-14 ENCOUNTER — Other Ambulatory Visit: Payer: Self-pay

## 2019-10-14 ENCOUNTER — Other Ambulatory Visit (INDEPENDENT_AMBULATORY_CARE_PROVIDER_SITE_OTHER): Payer: Commercial Managed Care - PPO

## 2019-10-14 DIAGNOSIS — E1165 Type 2 diabetes mellitus with hyperglycemia: Secondary | ICD-10-CM

## 2019-10-14 DIAGNOSIS — C49A2 Gastrointestinal stromal tumor of stomach: Secondary | ICD-10-CM

## 2019-10-14 DIAGNOSIS — D696 Thrombocytopenia, unspecified: Secondary | ICD-10-CM | POA: Diagnosis not present

## 2019-10-14 LAB — COMPREHENSIVE METABOLIC PANEL
ALT: 33 U/L (ref 0–53)
AST: 27 U/L (ref 0–37)
Albumin: 4.3 g/dL (ref 3.5–5.2)
Alkaline Phosphatase: 76 U/L (ref 39–117)
BUN: 20 mg/dL (ref 6–23)
CO2: 26 mEq/L (ref 19–32)
Calcium: 9.5 mg/dL (ref 8.4–10.5)
Chloride: 101 mEq/L (ref 96–112)
Creatinine, Ser: 0.88 mg/dL (ref 0.40–1.50)
GFR: 85.82 mL/min (ref >60.00)
Glucose, Bld: 123 mg/dL — ABNORMAL HIGH (ref 70–99)
Potassium: 4.1 mEq/L (ref 3.5–5.1)
Sodium: 135 mEq/L (ref 135–145)
Total Bilirubin: 0.5 mg/dL (ref 0.2–1.2)
Total Protein: 7.5 g/dL (ref 6.0–8.3)

## 2019-10-14 LAB — CBC WITH DIFFERENTIAL/PLATELET
Basophils Absolute: 0.1 10*3/uL (ref 0.0–0.1)
Basophils Relative: 0.9 % (ref 0.0–3.0)
Eosinophils Absolute: 0.3 10*3/uL (ref 0.0–0.7)
Eosinophils Relative: 3.5 % (ref 0.0–5.0)
HCT: 48.8 % (ref 39.0–52.0)
Hemoglobin: 16.5 g/dL (ref 13.0–17.0)
Lymphocytes Relative: 29.2 % (ref 12.0–46.0)
Lymphs Abs: 2.4 10*3/uL (ref 0.7–4.0)
MCHC: 33.8 g/dL (ref 30.0–36.0)
MCV: 99 fl (ref 78.0–100.0)
Monocytes Absolute: 0.6 10*3/uL (ref 0.1–1.0)
Monocytes Relative: 6.6 % (ref 3.0–12.0)
Neutro Abs: 5 10*3/uL (ref 1.4–7.7)
Neutrophils Relative %: 59.8 % (ref 43.0–77.0)
Platelets: 133 10*3/uL — ABNORMAL LOW (ref 150.0–400.0)
RBC: 4.93 Mil/uL (ref 4.22–5.81)
RDW: 14 % (ref 11.5–15.5)
WBC: 8.4 10*3/uL (ref 4.0–10.5)

## 2019-10-14 LAB — LIPID PANEL
Cholesterol: 117 mg/dL (ref 0–200)
HDL: 28.5 mg/dL — ABNORMAL LOW (ref >39.00)
NonHDL: 88.28
Total CHOL/HDL Ratio: 4
Triglycerides: 305 mg/dL — ABNORMAL HIGH (ref 0.0–149.0)
VLDL: 61 mg/dL — ABNORMAL HIGH (ref 0.0–40.0)

## 2019-10-14 LAB — HEMOGLOBIN A1C: Hgb A1c MFr Bld: 8.1 % — ABNORMAL HIGH (ref 4.6–6.5)

## 2019-10-14 LAB — LDL CHOLESTEROL, DIRECT: Direct LDL: 62 mg/dL

## 2019-10-16 ENCOUNTER — Telehealth: Payer: Self-pay | Admitting: Internal Medicine

## 2019-10-16 NOTE — Telephone Encounter (Signed)
He can look for jardiance coupon online See what his insurance prefers  If still needs help we have a pharmacist we can refer him to here to review this and help with pt assistance with meds  -does he want to do this?  Kevin Ryan

## 2019-10-16 NOTE — Telephone Encounter (Signed)
Please advise 

## 2019-10-16 NOTE — Telephone Encounter (Signed)
Pt just retired and would like Dr. Olivia Mackie to go through his medicines and see if there are inexpensive alternative medicines she could prescribe him. He said he don't want to give all the money he gets from social security to pay for medicines. Please advise. I think the main prescription was the [facility] he is referring to.

## 2019-10-16 NOTE — Telephone Encounter (Signed)
Patient informed and verbalized understanding.  States he would like a referral to the pharmacist.

## 2019-10-20 ENCOUNTER — Telehealth: Payer: Self-pay | Admitting: Internal Medicine

## 2019-10-20 NOTE — Telephone Encounter (Signed)
Referral placed to catie  [place]

## 2019-10-20 NOTE — Addendum Note (Signed)
Addended by: [name] on: 10/20/2019 04:01 AM   Modules accepted: Orders

## 2019-10-20 NOTE — Chronic Care Management (AMB) (Signed)
  Chronic Care Management   Outreach Note  10/20/2019 Name: Kevin Ryan MRN: [id number] DOB: [birth year]  Kevin Ryan is a 69 y.o. year old male who is a primary care patient of McLean-Scocuzza, Nino Glow, MD. I reached out to Kevin Ryan by phone today in response to a referral sent by Mr. Bennett Grilley Leyda's PCP, Dr. [name]     An unsuccessful telephone outreach was attempted today. The patient was referred to the case management team for assistance with care management and care coordination.   Follow Up Plan: A HIPPA compliant phone message was left for the patient providing contact information and requesting a return call.  The care management team will reach out to the patient again over the next 7 days.  If patient returns call to provider office, please advise to call Embedded Care Management Care Guide Glenna Durand at [place], [place], [place] Management ??[email or web address] ??[phone]

## 2019-10-20 NOTE — Chronic Care Management (AMB) (Signed)
  Chronic Care Management   Note  10/20/2019 Name: Kevin Ryan MRN: [id number] DOB: Dec 06, 1950  Kevin Ryan is a 69 y.o. year old male who is a primary care patient of McLean-Scocuzza, Nino Glow, MD. I reached out to Kevin Ryan by phone today in response to a referral sent by Kevin Ryan's PCP, Dr.Tracy McLean-Scocuzza     Kevin Ryan information about Chronic Care Management services today including:  1. CCM service includes personalized support from designated clinical staff supervised by his physician, including individualized plan of care and coordination with other care providers 2. 24/7 contact phone numbers for assistance for urgent and routine care needs. 3. Service will only be billed when office clinical staff spend 20 minutes or more in a month to coordinate care. 4. Only one practitioner may furnish and bill the service in a calendar month. 5. The patient may stop CCM services at any time (effective at the end of the month) by phone call to the office staff. 6. The patient will be responsible for cost sharing (co-pay) of up to 20% of the service fee (after annual deductible is met).  Patient agreed to services and verbal consent obtained.   Follow up plan: Telephone appointment with care management team member scheduled for:11/24/2019  Glenna Durand, LPN Health Advisor, [facility] ??[email or web address] ??[phone]

## 2019-10-22 ENCOUNTER — Ambulatory Visit: Payer: Self-pay | Admitting: Urology

## 2019-10-22 ENCOUNTER — Encounter: Payer: Self-pay | Admitting: Urology

## 2019-10-24 ENCOUNTER — Telehealth: Payer: Self-pay | Admitting: *Deleted

## 2019-10-24 ENCOUNTER — Ambulatory Visit
Admission: RE | Admit: 2019-10-24 | Discharge: 2019-10-24 | Disposition: A | Discharge status: Home / Self Care | Payer: Commercial Managed Care - PPO | Source: Ambulatory Visit | Attending: Internal Medicine | Admitting: Internal Medicine

## 2019-10-24 ENCOUNTER — Ambulatory Visit: Payer: Commercial Managed Care - PPO

## 2019-10-24 ENCOUNTER — Other Ambulatory Visit: Payer: Self-pay

## 2019-10-24 ENCOUNTER — Other Ambulatory Visit: Payer: Self-pay | Admitting: Internal Medicine

## 2019-10-24 ENCOUNTER — Telehealth: Payer: Self-pay | Admitting: Internal Medicine

## 2019-10-24 DIAGNOSIS — K760 Fatty (change of) liver, not elsewhere classified: Secondary | ICD-10-CM | POA: Insufficient documentation

## 2019-10-24 DIAGNOSIS — R42 Dizziness and giddiness: Secondary | ICD-10-CM

## 2019-10-24 DIAGNOSIS — W19XXXA Unspecified fall, initial encounter: Secondary | ICD-10-CM

## 2019-10-24 DIAGNOSIS — R911 Solitary pulmonary nodule: Secondary | ICD-10-CM | POA: Diagnosis not present

## 2019-10-24 DIAGNOSIS — Z87891 Personal history of nicotine dependence: Secondary | ICD-10-CM

## 2019-10-24 DIAGNOSIS — Z85028 Personal history of other malignant neoplasm of stomach: Secondary | ICD-10-CM | POA: Diagnosis not present

## 2019-10-24 DIAGNOSIS — Z122 Encounter for screening for malignant neoplasm of respiratory organs: Secondary | ICD-10-CM | POA: Diagnosis not present

## 2019-10-24 DIAGNOSIS — I700 Atherosclerosis of aorta: Secondary | ICD-10-CM | POA: Insufficient documentation

## 2019-10-24 DIAGNOSIS — H814 Vertigo of central origin: Secondary | ICD-10-CM | POA: Diagnosis not present

## 2019-10-24 DIAGNOSIS — R2689 Other abnormalities of gait and mobility: Secondary | ICD-10-CM | POA: Insufficient documentation

## 2019-10-24 DIAGNOSIS — J439 Emphysema, unspecified: Secondary | ICD-10-CM | POA: Insufficient documentation

## 2019-10-24 DIAGNOSIS — M21372 Foot drop, left foot: Secondary | ICD-10-CM | POA: Insufficient documentation

## 2019-10-24 DIAGNOSIS — E1165 Type 2 diabetes mellitus with hyperglycemia: Secondary | ICD-10-CM

## 2019-10-24 DIAGNOSIS — F17210 Nicotine dependence, cigarettes, uncomplicated: Secondary | ICD-10-CM | POA: Diagnosis not present

## 2019-10-24 DIAGNOSIS — Z720 Tobacco use: Secondary | ICD-10-CM

## 2019-10-24 MED ORDER — XIGDUO XR 10-1000 MG PO TB24: 1.0000 | ORAL_TABLET | Freq: Every morning | ORAL | 3 refills | Status: DC

## 2019-10-24 NOTE — Telephone Encounter (Signed)
Patient has been notified that annual lung cancer screening low dose CT scan is due currently or will be in near future. Confirmed that patient is within the age range of 55-77, and asymptomatic, (no signs or symptoms of lung cancer). Patient denies illness that would prevent curative treatment for lung cancer if found. Verified smoking history, (current, 49 pack year). The shared decision making visit was done 02/03/16. Patient is agreeable for CT scan being scheduled.

## 2019-10-24 NOTE — Telephone Encounter (Signed)
Pt needs a preferred medication in place of empagliflozin (JARDIANCE) 25 MG TABS tablet . He has a list of meds that will be covered instead. Pt has been out of medication for a couple of days. Please call today

## 2019-10-24 NOTE — Telephone Encounter (Signed)
States the medications are covered:  Farxiga  Invokana InvokaMet immediate and XR Xigduo XR

## 2019-10-24 NOTE — Telephone Encounter (Signed)
Sent xigduo XR 04-999 mg qd   Stop jardiance 2/2 cost and metformin xr 1000 in am and 500 mg qpm

## 2019-10-28 ENCOUNTER — Encounter: Payer: Self-pay | Admitting: *Deleted

## 2019-11-03 ENCOUNTER — Ambulatory Visit: Payer: Self-pay | Admitting: Pharmacist

## 2019-11-03 DIAGNOSIS — E1165 Type 2 diabetes mellitus with hyperglycemia: Secondary | ICD-10-CM

## 2019-11-03 NOTE — Chronic Care Management (AMB) (Signed)
Chronic Care Management   Note  11/03/2019 Name: Kevin Ryan MRN: [id number] DOB: 02-27-1951   Subjective:  Kevin Ryan is a 69 y.o. year old male who is a primary care patient of McLean-Scocuzza, Nino Glow, MD. The CCM team was consulted for assistance with chronic disease management and care coordination needs.    Contacted patient for medication access needs.   Review of patient status, including review of consultants reports, laboratory and other test data, was performed as part of comprehensive evaluation and provision of chronic care management services.   SDOH (Social Determinants of Health) assessments and interventions performed:  ues  Objective:  Lab Results  Component Value Date   CREATININE 0.88 10/14/2019   CREATININE 0.91 06/17/2019   CREATININE 1.03 09/17/2018    Lab Results  Component Value Date   HGBA1C 8.1 (H) 10/14/2019       Component Value Date/Time   CHOL 117 10/14/2019 0916   TRIG 305.0 (H) 10/14/2019 0916   HDL 28.50 (L) 10/14/2019 0916   CHOLHDL 4 10/14/2019 0916   VLDL 61.0 (H) 10/14/2019 0916   LDLCALC 87 05/16/2018 0800   LDLDIRECT 62.0 10/14/2019 0916    Clinical ASCVD: No  The ASCVD Risk score (Goff DC Jr., et al., 2013) failed to calculate for the following reasons:   The patient has a prior MI or stroke diagnosis    BP Readings from Last 3 Encounters:  07/02/19 133/79  06/04/19 (!) 141/81  05/13/19 (!) 142/62    Allergies  Allergen Reactions  . Ibuprofen Itching and Rash    Medications Reviewed Today    Reviewed by Thressa Sheller, CMA (Certified Medical Assistant) on 09/18/19 at [place] List Status: <None>  Medication Order Taking? Sig Documenting Provider Last Dose Status Informant  aspirin 81 MG tablet [id number]  Take 81 mg by mouth daily. [provider]  Active Self  Bioflavonoid Products (ESTER-C) TABS [id number]   [provider]  Active   Cholecalciferol (VITAMIN D3) 2000 units TABS [id number]   Take by mouth. [provider]  Active   clopidogrel (PLAVIX) 75 MG tablet [id number]  Take 75 mg by mouth daily. [provider]  Active Self  empagliflozin (JARDIANCE) 25 MG TABS tablet [id number]  Take 25 mg by mouth daily before breakfast. McLean-Scocuzza, Nino Glow, MD  Active   ezetimibe (ZETIA) 10 MG tablet [id number]  Take 1 tablet (10 mg total) by mouth daily. McLean-Scocuzza, Nino Glow, MD  Active   glipiZIDE (GLIPIZIDE XL) 5 MG 24 hr tablet [id number]  Take 1 tablet (5 mg total) by mouth daily with breakfast. McLean-Scocuzza, Nino Glow, MD  Active   lisinopril (PRINIVIL,ZESTRIL) 2.5 MG tablet [id number]  Take 1 tablet (2.5 mg total) by mouth daily. McLean-Scocuzza, Nino Glow, MD  Active   MAGNESIUM PO [id number]  Take by mouth daily. [provider]  Active   meclizine (ANTIVERT) 25 MG tablet [id number]  Take 25 mg by mouth 3 (three) times daily as needed for dizziness. [provider]  Active   metFORMIN (GLUCOPHAGE-XR) 500 MG 24 hr tablet [id number]  Take 1 tablet (500 mg total) by mouth 2 (two) times daily. 2 pills in the am with food and 1 pill at night with food McLean-Scocuzza, Nino Glow, MD  Active   metoprolol tartrate (LOPRESSOR) 50 MG tablet [id number]  Take 1 tablet (50 mg total) by mouth 2 (two) times daily. McLean-Scocuzza, Nino Glow, MD  Active   omega-3 acid ethyl esters (LOVAZA) 1  g capsule [id number]  Take 2 capsules (2 g total) by mouth 2 (two) times daily. McLean-Scocuzza, Nino Glow, MD  Active   Omega-3 Fatty Acids (FISH OIL) 1000 MG CAPS [id number]  Take 1,000 mg by mouth daily. [provider]  Active Self  Probiotic Product (PROBIOTIC DAILY PO) [id number]  Take by mouth. [provider]  Active   rosuvastatin (CRESTOR) 20 MG tablet [id number]  Take 1 tablet (20 mg total) by mouth at bedtime. McLean-Scocuzza, Nino Glow, MD  Active   vitamin B-12 (CYANOCOBALAMIN) 500 MCG tablet [id number]  Take 5,000 mcg by mouth daily.  [provider]   Active Self           Assessment:   Goals Addressed            This Visit's Progress     Patient Stated   . PharmD "I can't afford this medication" (pt-stated)       CARE PLAN ENTRY (see longtitudinal plan of care for additional care plan information)  Current Barriers:  . Financial concerns - patient received notification that his Part D plan no longer covered Jardiance. Preferred options include Temple Pacini, Invokamet. PCP switched to Xigduo XR, d/c separate metformin.  o Contacted patient prior to our appointment later this month d/t chart review noting he had been out of medication a one point o Today, patient reports that he picked up Xigduo (copay ~$50), but is finishing up current supply of metformin and Jardiance first . Diabetes: uncontrolled; complicated by chronic medical conditions including CAD, HTN, HLD, fatty liver, most recent A1c 8.1% . Most recent eGFR: ~85 mL/min . Current antihyperglycemic regimen: Xigduo XR 04/999 mg QAM; glipizide XL 5 mg QAM o Denied starting GLP1 w/ most recent elevated A1 . Cardiovascular risk reduction: o Current hypertensive regimen: lisinopril 2.5 mg QAM, metoprolol tartrate 50 mg BID  o Current hyperlipidemia regimen: ezetimibe 10 mg dail, rosuvastatin 20 mg daily, Lovaza 1 g BID o Current antiplatelet regimen: ASA 81   Pharmacist Clinical Goal(s):  Marland Kitchen Over the next 90 days, patient will work with PharmD and primary care provider to address optimized medication management  Interventions: . Contacted patient to ensure he had been able to pick up Xigduo. Confirms that he did. Will review medication management more extensively at upcoming appointment.  Patient Self Care Activities:  . Patient will check blood glucose daily, document, and provide at future appointments . Patient will take medications as prescribed . Patient will report any questions or concerns to provider   Initial goal documentation         Plan: - Will outreach as previously scheduled  [name], PharmD, [place], [place] Pharmacist [place] [phone]

## 2019-11-03 NOTE — Patient Instructions (Signed)
Visit Information  Goals Addressed            This Visit's Progress     Patient Stated   . PharmD "I can't afford this medication" (pt-stated)       CARE PLAN ENTRY (see longtitudinal plan of care for additional care plan information)  Current Barriers:  . Financial concerns - patient received notification that his Part D plan no longer covered Jardiance. Preferred options include Temple Pacini, Invokamet. PCP switched to Xigduo XR, d/c separate metformin.  o Contacted patient prior to our appointment later this month d/t chart review noting he had been out of medication a one point o Today, patient reports that he picked up Xigduo (copay ~$50), but is finishing up current supply of metformin and Jardiance first . Diabetes: uncontrolled; complicated by chronic medical conditions including CAD, HTN, HLD, fatty liver, most recent A1c 8.1% . Most recent eGFR: ~85 mL/min . Current antihyperglycemic regimen: Xigduo XR 04/999 mg QAM; glipizide XL 5 mg QAM o Denied starting GLP1 w/ most recent elevated A1 . Cardiovascular risk reduction: o Current hypertensive regimen: lisinopril 2.5 mg QAM, metoprolol tartrate 50 mg BID  o Current hyperlipidemia regimen: ezetimibe 10 mg dail, rosuvastatin 20 mg daily, Lovaza 1 g BID o Current antiplatelet regimen: ASA 81   Pharmacist Clinical Goal(s):  Marland Kitchen Over the next 90 days, patient will work with PharmD and primary care provider to address optimized medication management  Interventions: . Contacted patient to ensure he had been able to pick up Xigduo. Confirms that he did. Will review medication management more extensively at upcoming appointment.  Patient Self Care Activities:  . Patient will check blood glucose daily, document, and provide at future appointments . Patient will take medications as prescribed . Patient will report any questions or concerns to provider   Initial goal documentation        Patient verbalizes  understanding of instructions provided today.   Plan: - Will outreach as previously scheduled  [name], PharmD, [place], [place] Pharmacist [address] [phone]

## 2019-11-17 ENCOUNTER — Other Ambulatory Visit: Payer: Self-pay | Admitting: Internal Medicine

## 2019-11-17 DIAGNOSIS — E782 Mixed hyperlipidemia: Secondary | ICD-10-CM

## 2019-11-17 MED ORDER — OMEGA-3-ACID ETHYL ESTERS 1 G PO CAPS: 2.0000 g | ORAL_CAPSULE | Freq: Two times a day (BID) | ORAL | 3 refills | Status: DC

## 2019-11-18 DIAGNOSIS — G609 Hereditary and idiopathic neuropathy, unspecified: Secondary | ICD-10-CM

## 2019-11-18 HISTORY — DX: Hereditary and idiopathic neuropathy, unspecified: G60.9

## 2019-11-19 ENCOUNTER — Telehealth: Payer: Self-pay | Admitting: Internal Medicine

## 2019-11-19 NOTE — Telephone Encounter (Signed)
Prior authorization has been submitted for patient's Omega 3 Acid.   Awaiting approval or denial.

## 2019-11-20 ENCOUNTER — Telehealth: Payer: Self-pay | Admitting: Internal Medicine

## 2019-11-20 ENCOUNTER — Other Ambulatory Visit: Payer: Self-pay | Admitting: Internal Medicine

## 2019-11-20 DIAGNOSIS — E782 Mixed hyperlipidemia: Secondary | ICD-10-CM

## 2019-11-20 NOTE — Telephone Encounter (Signed)
faxed Medicare Drug Coverage request form  to [employer] 10mg  and crestor 20mg   Faxed completed on 11/20/19

## 2019-11-21 NOTE — Telephone Encounter (Signed)
Prior authorization has been approved [employer] sent.

## 2019-11-24 ENCOUNTER — Ambulatory Visit: Payer: Medicare Other

## 2019-11-24 NOTE — Chronic Care Management (AMB) (Signed)
  Chronic Care Management   Note  11/24/2019 Name: KHRYSTOPHER MCALPINE MRN: [id number] DOB: Dec 17, 1950  Kevin Ryan is a 69 y.o. year old male who is a primary care patient of McLean-Scocuzza, Nino Glow, MD. The CCM team was consulted for assistance with chronic disease management and care coordination needs.    Contacted patient for appointment as scheduled. He was not home, and noted he would call me back when he was home with his medications. Did not hear back from patient by end of business.  Will collaborate w/ Care Guide to outreach to reschedule phone visit with me  [name], PharmD, [place], [place] Pharmacist [place] [phone]

## 2019-12-10 ENCOUNTER — Ambulatory Visit (INDEPENDENT_AMBULATORY_CARE_PROVIDER_SITE_OTHER): Payer: PRIVATE HEALTH INSURANCE | Admitting: Urology

## 2019-12-10 ENCOUNTER — Encounter: Payer: Self-pay | Admitting: Urology

## 2019-12-10 ENCOUNTER — Emergency Department
Admission: EM | Admit: 2019-12-10 | Discharge: 2019-12-10 | Disposition: A | Discharge status: Home / Self Care | Payer: Medicare Other | Attending: Emergency Medicine | Admitting: Emergency Medicine

## 2019-12-10 ENCOUNTER — Emergency Department: Payer: Medicare Other

## 2019-12-10 ENCOUNTER — Encounter: Payer: Self-pay | Admitting: Emergency Medicine

## 2019-12-10 ENCOUNTER — Other Ambulatory Visit: Payer: Self-pay

## 2019-12-10 VITALS — BP 145/54 | HR 82 | Ht 73.0 in | Wt 250.0 lb

## 2019-12-10 DIAGNOSIS — E119 Type 2 diabetes mellitus without complications: Secondary | ICD-10-CM | POA: Diagnosis not present

## 2019-12-10 DIAGNOSIS — F17210 Nicotine dependence, cigarettes, uncomplicated: Secondary | ICD-10-CM | POA: Insufficient documentation

## 2019-12-10 DIAGNOSIS — Y9389 Activity, other specified: Secondary | ICD-10-CM | POA: Diagnosis not present

## 2019-12-10 DIAGNOSIS — W500XXA Accidental hit or strike by another person, initial encounter: Secondary | ICD-10-CM | POA: Diagnosis not present

## 2019-12-10 DIAGNOSIS — E291 Testicular hypofunction: Secondary | ICD-10-CM

## 2019-12-10 DIAGNOSIS — Y92009 Unspecified place in unspecified non-institutional (private) residence as the place of occurrence of the external cause: Secondary | ICD-10-CM | POA: Diagnosis not present

## 2019-12-10 DIAGNOSIS — Y999 Unspecified external cause status: Secondary | ICD-10-CM | POA: Diagnosis not present

## 2019-12-10 DIAGNOSIS — Z955 Presence of coronary angioplasty implant and graft: Secondary | ICD-10-CM | POA: Insufficient documentation

## 2019-12-10 DIAGNOSIS — Z7982 Long term (current) use of aspirin: Secondary | ICD-10-CM | POA: Diagnosis not present

## 2019-12-10 DIAGNOSIS — S40011A Contusion of right shoulder, initial encounter: Secondary | ICD-10-CM | POA: Diagnosis not present

## 2019-12-10 DIAGNOSIS — Z8509 Personal history of malignant neoplasm of other digestive organs: Secondary | ICD-10-CM | POA: Diagnosis not present

## 2019-12-10 DIAGNOSIS — Z7984 Long term (current) use of oral hypoglycemic drugs: Secondary | ICD-10-CM | POA: Diagnosis not present

## 2019-12-10 DIAGNOSIS — I2510 Atherosclerotic heart disease of native coronary artery without angina pectoris: Secondary | ICD-10-CM | POA: Diagnosis not present

## 2019-12-10 DIAGNOSIS — Z85828 Personal history of other malignant neoplasm of skin: Secondary | ICD-10-CM | POA: Insufficient documentation

## 2019-12-10 DIAGNOSIS — Z7902 Long term (current) use of antithrombotics/antiplatelets: Secondary | ICD-10-CM | POA: Insufficient documentation

## 2019-12-10 DIAGNOSIS — I10 Essential (primary) hypertension: Secondary | ICD-10-CM | POA: Insufficient documentation

## 2019-12-10 DIAGNOSIS — Z79899 Other long term (current) drug therapy: Secondary | ICD-10-CM | POA: Insufficient documentation

## 2019-12-10 DIAGNOSIS — S4991XA Unspecified injury of right shoulder and upper arm, initial encounter: Secondary | ICD-10-CM | POA: Diagnosis present

## 2019-12-10 MED ORDER — TESTOSTERONE 20.25 MG/ACT (1.62%) TD GEL: TRANSDERMAL | 1 refills | Status: DC

## 2019-12-10 MED ORDER — MELOXICAM 7.5 MG PO TABS
15.0000 mg | ORAL_TABLET | Freq: Once | ORAL | Status: AC
  Administered 2019-12-10: 15 mg via ORAL
  Filled 2019-12-10: qty 2

## 2019-12-10 MED ORDER — MELOXICAM 15 MG PO TABS: 15.0000 mg | ORAL_TABLET | Freq: Every day | ORAL | 0 refills | Status: DC

## 2019-12-10 MED ORDER — HYDROCODONE-ACETAMINOPHEN 5-325 MG PO TABS
1.0000 | ORAL_TABLET | Freq: Once | ORAL | Status: AC
  Administered 2019-12-10: 1 via ORAL
  Filled 2019-12-10: qty 1

## 2019-12-10 NOTE — ED Triage Notes (Signed)
Patient ambulatory to triage with steady gait, without difficulty or distress noted, mask in place; sling on rt arm; pt reports tripping over 22yr old child and injuring rt shoulder; denies any other c/o or injuries

## 2019-12-10 NOTE — Progress Notes (Signed)
12/10/19 4:00 PM   Kevin Ryan 1950/09/23 [id number]  Referring provider: McLean-Scocuzza, Nino Glow, MD [place],  [place] [address] Chief Complaint  Patient presents with  . Hypogonadism    HPI: Kevin Ryan is a 69 y.o. white male seen at the request of Dr. Terese Door for the evaluation and management of hypogonadism.  -Presents with 2-year history tiredness, fatigue, decreased libido and erectile dysfunction -No bothersome LUTS -No prior urologic history -Sleep apnea on CPAP     PMH: Past Medical History:  Diagnosis Date  . Abnormal CT of the abdomen   . Anemia    decreased platelets in last blood work  . 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 CAD (coronary artery disease)    s/p MI  . CAD (coronary artery disease)    s/p stent x 1 2004   . Cancer (HCC)    spindle cell stomach   . 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 Gastric mass 04/12/2015   cancer; resected  . History of kidney stones   . Hypercholesterolemia   . Hypertension   . Myocardial infarction ([place]) 2004  . Personal history of tobacco use, presenting hazards to health 07/13/2015  . Pneumonia   . Sleep apnea    CPAP  . 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 Vertigo    none in approx 1 yr  . Wears dentures    partial upper  . Wears hearing aid in both ears     Surgical History: Past Surgical History:  Procedure Laterality Date  . BACK SURGERY     01/29/18 [facility] lower  back   . CARDIAC CATHETERIZATION  2004   1 stent  . CARPAL TUNNEL RELEASE Bilateral   . CATARACT EXTRACTION W/PHACO Left 06/04/2019   Procedure: CATARACT EXTRACTION PHACO AND INTRAOCULAR LENS PLACEMENT ([facility]) LEFT DIABETIC 01:08.3  18.6%  12.76;  Surgeon: Leandrew Koyanagi, MD;  Location: [facility];  Service: Ophthalmology;  Laterality: Left;  diabetic - oral meds sleep apnea  . CATARACT EXTRACTION W/PHACO Right 07/02/2019   Procedure: CATARACT EXTRACTION PHACO AND  INTRAOCULAR LENS PLACEMENT ([place]) Right DIABETIC;  Surgeon: Leandrew Koyanagi, MD;  Location: [facility];  Service: Ophthalmology;  Laterality: Right;  diabetic-oral med sleep apnea (CPAP)  . COLONOSCOPY WITH PROPOFOL N/A 03/26/2015   Procedure: COLONOSCOPY WITH PROPOFOL;  Surgeon: Josefine Class, MD;  [facility] ENDOSCOPY;  Service: Endoscopy;  Laterality: N/A;  . KNEE SURGERY     TKR  . KNEE SURGERY     b/l partial knee Dr. Leanna Sato   . PARTIAL GASTRECTOMY     2016 [facility]    Home Medications:  Allergies as of 12/10/2019      Reactions   Ibuprofen Itching, Rash      Medication List       Accurate as of Dec 10, 2019  4:00 PM. If you have any questions, ask your nurse or doctor.        aspirin 81 MG tablet Take 81 mg by mouth daily.   clopidogrel 75 MG tablet Commonly known as: PLAVIX Take 75 mg by mouth daily.   Ester-C Tabs   ezetimibe 10 MG tablet Commonly known as: Zetia Take 1 tablet (10 mg total) by mouth daily.   Fish Oil 1000 MG Caps Take 1,000 mg by mouth daily.   glipiZIDE 5 MG 24 hr tablet Commonly known as: glipiZIDE XL Take 1 tablet (  5 mg total) by mouth daily with breakfast.   lisinopril 2.5 MG tablet Commonly known as: ZESTRIL Take 1 tablet (2.5 mg total) by mouth daily.   MAGNESIUM PO Take by mouth daily.   meclizine 25 MG tablet Commonly known as: ANTIVERT Take 25 mg by mouth 3 (three) times daily as needed for dizziness.   metoprolol tartrate 50 MG tablet Commonly known as: LOPRESSOR Take 1 tablet (50 mg total) by mouth 2 (two) times daily.   omega-3 acid ethyl esters 1 g capsule Commonly known as: Lovaza Take 2 capsules (2 g total) by mouth 2 (two) times daily.   PROBIOTIC DAILY PO Take by mouth.   rosuvastatin 20 MG tablet Commonly known as: CRESTOR Take 1 tablet (20 mg total) by mouth at bedtime.   vitamin B-12 500 MCG tablet Commonly known as: CYANOCOBALAMIN Take 5,000 mcg by mouth daily.   Vitamin D3  50 MCG (2000 UT) Tabs Take by mouth.   Xigduo XR 04-999 MG Tb24 Generic drug: Dapagliflozin-metFORMIN HCl ER Take 1 tablet by mouth in the morning. Stop jardiance and metformin xr 1000 mg qam and 500 mg qpm       Allergies:  Allergies  Allergen Reactions  . Ibuprofen Itching and Rash    Family History: Family History  Problem Relation Age of Onset  . Diabetes Mellitus II Maternal Grandfather   . Heart disease Maternal Grandfather   . CAD Paternal Grandfather   . Cancer Paternal Grandfather        ? type  . Arthritis Mother   . Depression Mother   . Hearing loss Mother   . Diabetes Father   . Cancer Brother        throat smoker   . Hearing loss Maternal Grandmother   . Arthritis Maternal Grandmother     Social History:  reports that he has been smoking cigarettes. He has a 25.00 pack-year smoking history. He has never used smokeless tobacco. He reports that he does not drink alcohol or use drugs.   Physical Exam: BP (!) 145/54   Pulse 82   Ht 6\' 1"  (1.854 m)   Wt 250 lb (113.4 kg)   BMI 32.98 kg/m   Constitutional:  Alert and oriented, No acute distress. HEENT: [place] AT, moist mucus membranes.  Trachea midline, no masses. Cardiovascular: No clubbing, cyanosis, or edema. Respiratory: Normal respiratory effort, no increased work of breathing. Skin: No rashes, bruises or suspicious lesions. Neurologic: Grossly intact, no focal deficits, moving all 4 extremities. Psychiatric: Normal mood and affect. Prostate: 30 grams, smooth no nodules Testicles: normal size, no masses  Laboratory Data:  Lab Results  Component Value Date   CREATININE 0.88 10/14/2019    Lab Results  Component Value Date   PSA 0.29 06/17/2019   PSA 0.3 05/16/2018    Lab Results  Component Value Date   TESTOSTERONE 169.82 (L) 06/17/2019     Assessment & Plan:   1.  Hypogonadism He has symptomatic hypogonadism.  TRT was discussed in detail including topical preparations and  intramuscular injections.  [name] was also discussed.  He would prefer a topical.  He has not had an LH which was ordered.  Potential side effects of testosterone replacement were discussed including stimulation of benign prostatic growth with lower urinary tract symptoms; erythrocytosis; edema; gynecomastia; worsening sleep apnea; venous thromboembolism; testicular atrophy and infertility. Recent studies suggesting an increased incidence of heart attack and stroke in patients taking testosterone was discussed. He was informed there is conflicting  evidence regarding the impact of testosterone therapy on cardiovascular risk. The theoretical risk of growth stimulation of an undetected prostate cancer was also discussed.  He was informed that current evidence does not provide any definitive answers regarding the risks of testosterone therapy on prostate cancer and cardiovascular disease. The need for periodic monitoring of his testosterone level, PSA, hematocrit and DRE was discussed.  Rx generic AndroGel 1.62% sent to pharmacy.  Follow-up 4 weeks for symptom reassessment and testosterone level.  [place] [address], [place], [place] [address] [phone]  I, Joneen Boers Peace, am acting as a Education administrator for Dr. Nicki Reaper C. Nevyn Bossman.  I have reviewed the above documentation for accuracy and completeness, and I agree with the above.   Abbie Sons, MD

## 2019-12-10 NOTE — ED Provider Notes (Signed)
[facility] Emergency Department Provider Note  ____________________________________________  Time seen: Approximately 9:18 PM  I have reviewed the triage vital signs and the nursing notes.   HISTORY  Chief Complaint Shoulder Injury    HPI Kevin Ryan is a 68 y.o. male who presents the emergency department complaining of right shoulder pain.  Patient states that he was at home, 12-year-old became entangled in his leg causing him to fall landing directly on his right shoulder.  Patient states that he is having pain to the anterior aspect of the shoulder.  He still has range of motion.  He did not hit his head or lose consciousness.  Patient states that he recently had a rotator cuff injury to the left shoulder, he has improved at this time and now has injured his right shoulder.  No history of previous injuries or surgeries to the right shoulder.  Patient denies any other complaints at this time.         Past Medical History:  Diagnosis Date  . Abnormal CT of the abdomen   . Anemia    decreased platelets in last blood work  . 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 CAD (coronary artery disease)    s/p MI  . CAD (coronary artery disease)    s/p stent x 1 2004   . Cancer (HCC)    spindle cell stomach   . 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 Gastric mass 04/12/2015   cancer; resected  . History of kidney stones   . Hypercholesterolemia   . Hypertension   . Myocardial infarction ([place]) 2004  . Personal history of tobacco use, presenting hazards to health 07/13/2015  . Pneumonia   . Sleep apnea    CPAP  . 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 Vertigo    none in approx 1 yr  . Wears dentures    partial upper  . Wears hearing aid in both ears     Patient Active Problem List   Diagnosis Date Noted  . OSA on CPAP 09/19/2019  . Vertigo 09/17/2018  . 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  Hyperlipidemia 06/18/2018  . Excessive cerumen in both ear canals 05/16/2018  . 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 Elevated TSH 03/28/2018  . Lung nodule 03/28/2018  . CTS (carpal tunnel syndrome) 03/28/2018  . DM2 (diabetes mellitus, type 2) ([place]) 03/25/2018  . CAD (coronary artery disease) 03/25/2018  . Arthritis 03/25/2018  . Fatty liver 03/25/2018  . Vitamin D deficiency 03/25/2018  . Long term use of drug 01/23/2018  . Cervical spondylosis with myelopathy 11/21/2017  . Spinal stenosis of lumbar region at multiple levels 11/21/2017  . Cellulitis 01/29/2017  . Personal history of tobacco use, presenting hazards to health 07/13/2015  . 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 BACK SURGERY     01/29/18 [facility] lower  back   . CARDIAC CATHETERIZATION  2004   1 stent  . CARPAL TUNNEL RELEASE Bilateral   . CATARACT EXTRACTION W/PHACO Left 06/04/2019   Procedure: CATARACT EXTRACTION PHACO AND INTRAOCULAR LENS PLACEMENT (IOC) LEFT DIABETIC 01:08.3  18.6%  12.76;  Surgeon: Leandrew Koyanagi, MD;  Location: [place];  Service: Ophthalmology;  Laterality: Left;  diabetic - oral meds sleep apnea  . CATARACT  EXTRACTION W/PHACO Right 07/02/2019   Procedure: CATARACT EXTRACTION PHACO AND INTRAOCULAR LENS PLACEMENT ([place]) Right DIABETIC;  Surgeon: Leandrew Koyanagi, MD;  Location: [place];  Service: Ophthalmology;  Laterality: Right;  diabetic-oral med sleep apnea (CPAP)  . COLONOSCOPY WITH PROPOFOL N/A 03/26/2015   Procedure: COLONOSCOPY WITH PROPOFOL;  Surgeon: Josefine Class, MD;  [facility]COPY;  Service: Endoscopy;  Laterality: N/A;  . KNEE SURGERY     TKR  . KNEE SURGERY     b/l partial knee Dr. Leanna Sato   . PARTIAL GASTRECTOMY     2016 [facility]    Prior to Admission medications   Medication  Sig Start Date End Date Taking? Authorizing Provider  aspirin 81 MG tablet Take 81 mg by mouth daily.    [provider]  Bioflavonoid Products (ESTER-C) TABS  08/19/19   [provider]  Cholecalciferol (VITAMIN D3) 2000 units TABS Take by mouth.    [provider]  clopidogrel (PLAVIX) 75 MG tablet Take 75 mg by mouth daily.    [provider]  Dapagliflozin-metFORMIN HCl ER (XIGDUO XR) 04-999 MG TB24 Take 1 tablet by mouth in the morning. Stop jardiance and metformin xr 1000 mg qam and 500 mg qpm 10/24/19   McLean-Scocuzza, Nino Glow, MD  ezetimibe (ZETIA) 10 MG tablet Take 1 tablet (10 mg total) by mouth daily. 10/07/19   McLean-Scocuzza, Nino Glow, MD  glipiZIDE (GLIPIZIDE XL) 5 MG 24 hr tablet Take 1 tablet (5 mg total) by mouth daily with breakfast. 09/24/19   McLean-Scocuzza, Nino Glow, MD  lisinopril (ZESTRIL) 2.5 MG tablet Take 1 tablet (2.5 mg total) by mouth daily. 09/24/19   McLean-Scocuzza, Nino Glow, MD  MAGNESIUM PO Take by mouth daily.    [provider]  meclizine (ANTIVERT) 25 MG tablet Take 25 mg by mouth 3 (three) times daily as needed for dizziness.    [provider]  meloxicam (MOBIC) 15 MG tablet Take 1 tablet (15 mg total) by mouth daily. 12/10/19   Toni Demo, Charline Bills, PA-C  metoprolol tartrate (LOPRESSOR) 50 MG tablet Take 1 tablet (50 mg total) by mouth 2 (two) times daily. 02/25/19   McLean-Scocuzza, Nino Glow, MD  omega-3 acid ethyl esters (LOVAZA) 1 g capsule Take 2 capsules (2 g total) by mouth 2 (two) times daily. 11/17/19   McLean-Scocuzza, Nino Glow, MD  Omega-3 Fatty Acids (FISH OIL) 1000 MG CAPS Take 1,000 mg by mouth daily.    [provider]  Probiotic Product (PROBIOTIC DAILY PO) Take by mouth.    [provider]  rosuvastatin (CRESTOR) 20 MG tablet Take 1 tablet (20 mg total) by mouth at bedtime. 01/02/19   McLean-Scocuzza, Nino Glow, MD  Testosterone 20.25 MG/ACT (1.62%) GEL Apply 1 pump to each shoulder  daily 12/10/19   Stoioff, Ronda Fairly, MD  vitamin B-12 (CYANOCOBALAMIN) 500 MCG tablet Take 5,000 mcg by mouth daily.     [provider]    Allergies Ibuprofen  Family History  Problem Relation Age of Onset  . Diabetes Mellitus II Maternal Grandfather   . Heart disease Maternal Grandfather   . CAD Paternal Grandfather   . Cancer Paternal Grandfather        ? type  . Arthritis Mother   . Depression Mother   . Hearing loss Mother   . Diabetes Father   . Cancer Brother        throat smoker   . Hearing loss Maternal Grandmother   . Arthritis Maternal  Grandmother     Social History Social History   Tobacco Use  . Smoking status: Current Every Day Smoker    Packs/day: 0.50    Years: 50.00    Pack years: 25.00    Types: Cigarettes  . Smokeless tobacco: Never Used  . Tobacco comment: 1 ppd or a little less (since age 79)  Substance Use Topics  . Alcohol use: No    Comment: may have drink 3-4x/yr  . Drug use: No     Review of Systems  Constitutional: No fever/chills Eyes: No visual changes. No discharge ENT: No upper respiratory complaints. Cardiovascular: no chest pain. Respiratory: no cough. No SOB. Gastrointestinal: No abdominal pain.  No nausea, no vomiting.  No diarrhea.  No constipation. Musculoskeletal: Positive for right shoulder injury Skin: Negative for rash, abrasions, lacerations, ecchymosis. Neurological: Negative for headaches, focal weakness or numbness. 10-point ROS otherwise negative.  ____________________________________________   PHYSICAL EXAM:  VITAL SIGNS: ED Triage Vitals [12/10/19 2019]  Enc Vitals Group     BP 134/83     Pulse Rate 91     Resp 18     Temp 98 F (36.7 C)     Temp Source Oral     SpO2 97 %     Weight 250 lb (113.4 kg)     Height 6\' 1"  (1.854 m)     Head Circumference      Peak Flow      Pain Score 7     Pain Loc      Pain Edu?      Excl. in [place]?      Constitutional: Alert and oriented. Well appearing  and in no acute distress. Eyes: Conjunctivae are normal. PERRL. EOMI. Head: Atraumatic. ENT:      Ears:       Nose: No congestion/rhinnorhea.      Mouth/Throat: Mucous membranes are moist.  Neck: No stridor.  No cervical spine tenderness to palpation.  Cardiovascular: Normal rate, regular rhythm. Normal S1 and S2.  Good peripheral circulation. Respiratory: Normal respiratory effort without tachypnea or retractions. Lungs CTAB. Good air entry to the bases with no decreased or absent breath sounds. Musculoskeletal: Full range of motion to all extremities. No gross deformities appreciated.  No visible deformity to the right shoulder.  Good range of motion.  Patient is tender to palpation over the acromioclavicular joint space but no palpable abnormality.  Radial pulse and sensation intact distally.  Examination of the upper arm, elbow is unremarkable. Neurologic:  Normal speech and language. No gross focal neurologic deficits are appreciated.  Skin:  Skin is warm, dry and intact. No rash noted. Psychiatric: Mood and affect are normal. Speech and behavior are normal. Patient exhibits appropriate insight and judgement.   ____________________________________________   LABS (all labs ordered are listed, but only abnormal results are displayed)  Labs Reviewed - No data to display ____________________________________________  EKG   ____________________________________________  RADIOLOGY I personally viewed and evaluated these images as part of my medical decision making, as well as reviewing the written report by the radiologist.  DG Shoulder Right  Result Date: 12/10/2019 CLINICAL DATA:  Fall EXAM: RIGHT SHOULDER - 2+ VIEW COMPARISON:  Shoulder radiograph 02/17/2012 FINDINGS: There is no evidence of fracture or dislocation. There is no evidence of arthropathy or other focal bone abnormality. Soft tissues are unremarkable. IMPRESSION: Negative. Electronically Signed   By: Ulyses Jarred M.D.    On: 12/10/2019 21:01    ____________________________________________    PROCEDURES  Procedure(s) performed:    Procedures    Medications - No data to display   ____________________________________________   INITIAL IMPRESSION / ASSESSMENT AND PLAN / ED COURSE  Pertinent labs & imaging results that were available during my care of the patient were reviewed by me and considered in my medical decision making (see chart for details).  Review of the [place] CSRS was performed in accordance of the [place] prior to dispensing any controlled drugs.           Patient's diagnosis is consistent with shoulder contusion.  Patient presented to the emergency department complaining of right shoulder pain after mechanical fall.  Images reveal no acute osseous abnormality.  Exam was overall reassuring.  Patient will be discharged with anti-inflammatories for symptom relief.  Follow-up primary care orthopedics as needed. Patient is given ED precautions to return to the ED for any worsening or new symptoms.     ____________________________________________  FINAL CLINICAL IMPRESSION(S) / ED DIAGNOSES  Final diagnoses:  Contusion of right shoulder, initial encounter      NEW MEDICATIONS STARTED DURING THIS VISIT:  ED Discharge Orders         Ordered    meloxicam (MOBIC) 15 MG tablet  Daily     12/10/19 2121              This chart was dictated using voice recognition software/Dragon. Despite best efforts to proofread, errors can occur which can change the meaning. Any change was purely unintentional.    Darletta Moll, PA-C 12/10/19 2122    Delman Kitten, MD 12/10/19 2222

## 2019-12-11 ENCOUNTER — Ambulatory Visit: Payer: Self-pay | Admitting: Pharmacist

## 2019-12-11 ENCOUNTER — Telehealth: Payer: Medicare Other

## 2019-12-11 ENCOUNTER — Encounter: Payer: Self-pay | Admitting: Urology

## 2019-12-11 DIAGNOSIS — E291 Testicular hypofunction: Secondary | ICD-10-CM | POA: Insufficient documentation

## 2019-12-11 HISTORY — DX: Testicular hypofunction: E29.1

## 2019-12-11 LAB — LUTEINIZING HORMONE: LH: 4.5 m[IU]/mL (ref 1.7–8.6)

## 2019-12-11 NOTE — Chronic Care Management (AMB) (Signed)
  Chronic Care Management   Note  12/11/2019 Name: Kevin Ryan MRN: [id number] DOB: 1950-08-10  [name] is a 69 y.o. year old male who is a primary care patient of McLean-Scocuzza, Nino Glow, MD. The CCM team was consulted for assistance with chronic disease management and care coordination needs.    Attempted to contact patient for medication management review. Left HIPAA compliant message for patient to return my call at their convenience.   Plan: - Will collaborate with Care Guide to outreach to schedule follow up with me  [name], PharmD, [place], [place] Pharmacist [place] [phone]

## 2019-12-12 ENCOUNTER — Telehealth: Payer: Self-pay | Admitting: Internal Medicine

## 2019-12-12 NOTE — Chronic Care Management (AMB) (Signed)
  Care Management   Note  12/12/2019 Name: ALEJANDRA LEMMO MRN: [id number] DOB: 06-16-1951  [name] is a 69 y.o. year old male who is a primary care patient of McLean-Scocuzza, Nino Glow, MD and is actively engaged with the care management team. I reached out to [name] by phone today to assist with scheduling a follow up visit with the Pharmacist  Follow up plan: Unsuccessful telephone outreach attempt made. A HIPPA compliant phone message was left for the patient providing contact information and requesting a return call.  The care management team will reach out to the patient again over the next 7 days.  If patient returns call to provider office, please advise to call [place]  at [place], [place], [place],  [address] Direct Dial: [phone] [email or web address]: [email or web address]

## 2019-12-12 NOTE — Chronic Care Management (AMB) (Signed)
  Care Management   Note  12/12/2019 Name: Kevin Ryan MRN: [id number] DOB: 1951-02-18  Kevin Ryan is a 69 y.o. year old male who is a primary care patient of McLean-Scocuzza, Nino Glow, MD and is actively engaged with the care management team. I reached out to Kevin Ryan by phone today to assist with re-scheduling a follow up visit with the Pharmacist  Follow up plan: Telephone appointment with care management team member scheduled for:01/02/2020  Noreene Larsson, [place], [place], [place] [address] Direct Dial: [phone] [email or web address]: [email or web address]

## 2019-12-15 ENCOUNTER — Telehealth: Payer: Self-pay

## 2019-12-15 NOTE — Telephone Encounter (Signed)
Incoming call from [employer] requesting additional information for patient's prior auth to be completed. Please call Denisha at [phone]

## 2019-12-15 NOTE — Telephone Encounter (Signed)
Approved 12/10/2019 until further notices.

## 2020-01-01 ENCOUNTER — Other Ambulatory Visit: Payer: Self-pay | Admitting: Internal Medicine

## 2020-01-01 DIAGNOSIS — E785 Hyperlipidemia, unspecified: Secondary | ICD-10-CM

## 2020-01-01 DIAGNOSIS — E119 Type 2 diabetes mellitus without complications: Secondary | ICD-10-CM

## 2020-01-01 DIAGNOSIS — I10 Essential (primary) hypertension: Secondary | ICD-10-CM

## 2020-01-01 MED ORDER — METOPROLOL TARTRATE 50 MG PO TABS: 50.0000 mg | ORAL_TABLET | Freq: Two times a day (BID) | ORAL | 3 refills | Status: DC

## 2020-01-01 MED ORDER — ROSUVASTATIN CALCIUM 20 MG PO TABS: 20.0000 mg | ORAL_TABLET | Freq: Every day | ORAL | 3 refills | Status: DC

## 2020-01-02 ENCOUNTER — Telehealth: Payer: Self-pay | Admitting: Internal Medicine

## 2020-01-02 ENCOUNTER — Ambulatory Visit (INDEPENDENT_AMBULATORY_CARE_PROVIDER_SITE_OTHER): Payer: Medicare Other | Admitting: Pharmacist

## 2020-01-02 DIAGNOSIS — E785 Hyperlipidemia, unspecified: Secondary | ICD-10-CM | POA: Diagnosis not present

## 2020-01-02 DIAGNOSIS — E1165 Type 2 diabetes mellitus with hyperglycemia: Secondary | ICD-10-CM | POA: Diagnosis not present

## 2020-01-02 DIAGNOSIS — I2510 Atherosclerotic heart disease of native coronary artery without angina pectoris: Secondary | ICD-10-CM

## 2020-01-02 DIAGNOSIS — E782 Mixed hyperlipidemia: Secondary | ICD-10-CM

## 2020-01-02 DIAGNOSIS — E7849 Other hyperlipidemia: Secondary | ICD-10-CM | POA: Diagnosis not present

## 2020-01-02 DIAGNOSIS — E119 Type 2 diabetes mellitus without complications: Secondary | ICD-10-CM

## 2020-01-02 MED ORDER — ROSUVASTATIN CALCIUM 40 MG PO TABS: 40.0000 mg | ORAL_TABLET | Freq: Every day | ORAL | 1 refills | Status: DC

## 2020-01-02 MED ORDER — RYBELSUS 3 MG PO TABS: 3.0000 mg | ORAL_TABLET | Freq: Every day | ORAL | 1 refills | Status: DC

## 2020-01-02 NOTE — Chronic Care Management (AMB) (Addendum)
Chronic Care Management   Follow Up Note   01/02/2020 Name: Kevin Ryan MRN: [id number] DOB: 1951-07-31  Referred by: McLean-Scocuzza, Nino Glow, MD Reason for referral : Chronic Care Management (Medication Management)   Kevin Ryan is a 69 y.o. year old male who is a primary care patient of McLean-Scocuzza, Nino Glow, MD. The CCM team was consulted for assistance with chronic disease management and care coordination needs.    Contacted patient for medication management review.   Review of patient status, including review of consultants reports, relevant laboratory and other test results, and collaboration with appropriate care team members and the patient's provider was performed as part of comprehensive patient evaluation and provision of chronic care management services.    SDOH (Social Determinants of Health) assessments performed: No See Care Plan activities for detailed interventions related to [facility])     Outpatient Encounter Medications as of 01/02/2020  Medication Sig  . aspirin 81 MG tablet Take 81 mg by mouth daily.  . Cholecalciferol (VITAMIN D3) 2000 units TABS Take by mouth.  . clopidogrel (PLAVIX) 75 MG tablet Take 75 mg by mouth daily.  . Dapagliflozin-metFORMIN HCl ER (XIGDUO XR) 04-999 MG TB24 Take 1 tablet by mouth in the morning. Stop jardiance and metformin xr 1000 mg qam and 500 mg qpm  . ezetimibe (ZETIA) 10 MG tablet Take 1 tablet (10 mg total) by mouth daily.  Marland Kitchen glipiZIDE (GLIPIZIDE XL) 5 MG 24 hr tablet Take 1 tablet (5 mg total) by mouth daily with breakfast.  . lisinopril (ZESTRIL) 2.5 MG tablet Take 1 tablet (2.5 mg total) by mouth daily.  Marland Kitchen MAGNESIUM PO Take by mouth daily.  . metoprolol tartrate (LOPRESSOR) 50 MG tablet Take 1 tablet (50 mg total) by mouth 2 (two) times daily.  . Omega-3 Fatty Acids (FISH OIL) 1000 MG CAPS Take 1,000 mg by mouth in the morning and at bedtime.   . Probiotic Product (PROBIOTIC DAILY PO) Take by mouth.  . rosuvastatin  (CRESTOR) 40 MG tablet Take 1 tablet (40 mg total) by mouth at bedtime.  . vitamin B-12 (CYANOCOBALAMIN) 500 MCG tablet Take 5,000 mcg by mouth daily.   . [DISCONTINUED] rosuvastatin (CRESTOR) 20 MG tablet Take 1 tablet (20 mg total) by mouth at bedtime.  . meclizine (ANTIVERT) 25 MG tablet Take 25 mg by mouth 3 (three) times daily as needed for dizziness.  . meloxicam (MOBIC) 15 MG tablet Take 1 tablet (15 mg total) by mouth daily.  Marland Kitchen omega-3 acid ethyl esters (LOVAZA) 1 g capsule Take 2 capsules (2 g total) by mouth 2 (two) times daily. (Patient not taking: Reported on 01/02/2020)  . Semaglutide (RYBELSUS) 3 MG TABS Take 3 mg by mouth daily. Take on an empty stomach and wait 30 minutes before meal  . Testosterone 20.25 MG/ACT (1.62%) GEL Apply 1 pump to each shoulder daily  . [DISCONTINUED] Bioflavonoid Products (ESTER-C) TABS    No facility-administered encounter medications on file as of 01/02/2020.     Objective:   Goals Addressed            This Visit's Progress     Patient Stated   . PharmD "I can't afford this medication" (pt-stated)       CARE PLAN ENTRY (see longtitudinal plan of care for additional care plan information)  Current Barriers:  . Diabetes: uncontrolled; complicated by chronic medical conditions including CAD, HTN, HLD, fatty liver, most recent A1c 8.1% o Worried about increase in blood sugars recently o Reports  that his insurance does not cover Rx omega 3 fatty acids. He has increased his OTC omega 3 fatty acids since this time . Most recent eGFR: ~85 mL/min . Current antihyperglycemic regimen: Xigduo XR 04/999 mg QAM; glipizide XL 5 mg QAM o Denied starting GLP1 w/ most recent elevated A1c . Current fasting readings:  o Fastings: 150-160s o Not checking 2 hour post prandials  . Cardiovascular risk reduction: o Current hypertensive regimen: lisinopril 2.5 mg QAM, metoprolol tartrate 50 mg BID  o Current hyperlipidemia regimen: ezetimibe 10 mg daily,  rosuvastatin 20 mg daily, OTC omega 3 fatty acids 1 g BID o Current antiplatelet regimen: ASA 81, clopidogrel 75 mg daily   Pharmacist Clinical Goal(s):  Marland Kitchen Over the next 90 days, patient will work with PharmD and primary care provider to address optimized medication management  Interventions: . Comprehensive medication review performed, medication list updated in electronic medical record . Inter-disciplinary care team collaboration (see longitudinal plan of care) . Reviewed recent lipid panel. Reviewed that OTC omega 3 fatty acids may not provide the same degree of TG lowering as Rx. Could mitigate this benefit my increasing rosuvastatin. Patient in agreement with this. Increase rosuvastatin to 40 mg daily. Continue ezetimibe 10 mg daily and OTC omega 3 fatty acids 1 g BID. Patient has f/u with PCP in ~4 weeks, can discuss f/u lab work at that time. . Reviewed recent A1c. Reviewed that w/ switch from Jardiance + metformin to combination Xigduo, he is only receiving 1000 mg metformin vs 1000 mg BID. Would not want to switch to Xigduo 11/998 mg BID, because would not want to cause nocturia w/ evening SGLT2. Would not want to order 2000 mg metformin at once d/t GI upset.  . Discussed option of addition of GLP1. Reviewed injectables w/ CV benefit, patient declines injectable therapy at this time. Discussed PO Rybelsus. Patient amenable Start Rybelsus 3 mg once daily (on an empty stomach 30 minutes before meals) x 30 days. Will titrate as tolerated. Discussed that pending efficacy and tolerability, we may be able to d/c glipizide in the future.  . Reviewed goal A1c, goal fasting, and goal 2 hour post prandial glucose . Addendum: Patient called me back, his copay for Rybelsus is going to be over $400/month. He is not interested in injectable GLP. Discussed patient assistance. Appears he will qualify for Rybelsus through [employer] patient assistance. Will collaborate w/ [name] to mail patient his portion of  the application. He is aware we will need proof of income (his SSI and his wife's 2 most recent pay stubs) and his [employer] card. Will collaborate w/ PCP to sign provider portion. Once all parts received, will collaborate w/ [name] to submit.   Patient Self Care Activities:  . Patient will check blood glucose daily, document, and provide at future appointments . Patient will take medications as prescribed . Patient will report any questions or concerns to provider   Please see past updates related to this goal by clicking on the "Past Updates" button in the selected goal          Plan:  - Will collaborate w/ patient and provider as above  [name], PharmD, [place], [place] Pharmacist [place] [phone]

## 2020-01-02 NOTE — Telephone Encounter (Signed)
Contacted patient. See CCM documentation. 

## 2020-01-02 NOTE — Telephone Encounter (Signed)
Pt returned call stated he did not know he had a phone appt today

## 2020-01-02 NOTE — Patient Instructions (Addendum)
Kevin Ryan,   It was great talking with you today!  Here is what we discussed:   1) To better target triglycerides, increase rosuvastatin to 40 mg daily. Continue ezetimibe 10 mg daily and omega 3 fatty acids 1 g twice daily.   2) To better lower blood sugars, I want to add a new class of medication. Rybelsus (semaglutide) is a pill. Take 3 mg tablet once daily, first thing in the morning on an empty stomach, and wait at least 30 minutes before eating anything. This medication can cause some stomach upset when first starting, so focus on not over eating. This "queasiness", if it happens, generally resolves over time.   Be on the look out for the Patient Assistance Application paperwork for the Rybelsus through [employer]   For now, continue Xigduo XR 04/999 mg every morning and glipizide XL 5 mg every morning. Moving forward, we may be able to stop glipizide.   Our goal A1c (3 month average of your blood sugars ) is <7%. This is the best way to reduce your risk of the long term complications of diabetes (kidney disease, limb amputations, heart attacks, strokes). A1c <7% generally correlates with fasting sugars <130 and 2 hour after meal sugars <180.   Call me with any questions!  [name], PharmD, [place]  Visit Information  Goals Addressed            This Visit's Progress     Patient Stated   . PharmD "I can't afford this medication" (pt-stated)       CARE PLAN ENTRY (see longtitudinal plan of care for additional care plan information)  Current Barriers:  . Diabetes: uncontrolled; complicated by chronic medical conditions including CAD, HTN, HLD, fatty liver, most recent A1c 8.1% o Worried about increase in blood sugars recently o Reports that his insurance does not cover Rx omega 3 fatty acids. He has increased his OTC omega 3 fatty acids since this time . Most recent eGFR: ~85 mL/min . Current antihyperglycemic regimen: Xigduo XR 04/999 mg QAM; glipizide  XL 5 mg QAM o Denied starting GLP1 w/ most recent elevated A1c . Current fasting readings:  o Fastings: 150-160s o Not checking 2 hour post prandials  . Cardiovascular risk reduction: o Current hypertensive regimen: lisinopril 2.5 mg QAM, metoprolol tartrate 50 mg BID  o Current hyperlipidemia regimen: ezetimibe 10 mg daily, rosuvastatin 20 mg daily, OTC omega 3 fatty acids 1 g BID o Current antiplatelet regimen: ASA 81, clopidogrel 75 mg daily   Pharmacist Clinical Goal(s):  Marland Kitchen Over the next 90 days, patient will work with PharmD and primary care provider to address optimized medication management  Interventions: . Comprehensive medication review performed, medication list updated in electronic medical record . Inter-disciplinary care team collaboration (see longitudinal plan of care) . Reviewed recent lipid panel. Reviewed that OTC omega 3 fatty acids may not provide the same degree of TG lowering as Rx. Could mitigate this benefit my increasing rosuvastatin. Patient in agreement with this. Increase rosuvastatin to 40 mg daily. Continue ezetimibe 10 mg daily and OTC omega 3 fatty acids 1 g BID. Patient has f/u with PCP in ~4 weeks, can discuss f/u lab work at that time. . Reviewed recent A1c. Reviewed that w/ switch from Jardiance + metformin to combination Xigduo, he is only receiving 1000 mg metformin vs 1000 mg BID. Would not want to switch to Xigduo 11/998 mg BID, because would not want to cause nocturia w/ evening SGLT2. Would not  want to order 2000 mg metformin at once d/t GI upset.  . Discussed option of addition of GLP1. Reviewed injectables w/ CV benefit, patient declines injectable therapy at this time. Discussed PO Rybelsus. Patient amenable Start Rybelsus 3 mg once daily (on an empty stomach 30 minutes before meals) x 30 days. Will titrate as tolerated. Discussed that pending efficacy and tolerability, we may be able to d/c glipizide in the future.  . Reviewed goal A1c, goal fasting,  and goal 2 hour post prandial glucose . Addendum: Patient called me back, his copay for Rybelsus is going to be over $400/month. He is not interested in injectable GLP. Discussed patient assistance. Appears he will qualify for Rybelsus through [employer] patient assistance. Will collaborate w/ [name] to mail patient his portion of the application. He is aware we will need proof of income (his SSI and his wife's 2 most recent pay stubs) and his [employer] card. Will collaborate w/ PCP to sign provider portion. Once all parts received, will collaborate w/ [name] to submit.   Patient Self Care Activities:  . Patient will check blood glucose daily, document, and provide at future appointments . Patient will take medications as prescribed . Patient will report any questions or concerns to provider   Please see past updates related to this goal by clicking on the "Past Updates" button in the selected goal         Patient verbalizes understanding of instructions provided today.      Plan:  - Will collaborate w/ patient and provider as above  [name], PharmD, [place], [place] Pharmacist  [place]  [phone]

## 2020-01-06 ENCOUNTER — Other Ambulatory Visit: Payer: Self-pay | Admitting: Pharmacy Technician

## 2020-01-06 NOTE — Patient Outreach (Signed)
[facility]) Care Management  01/06/2020  Kevin Ryan 1951/07/06 [id number]                                       Medication Assistance Referral  Referral From: [place] RPh [name]   Medication/Company: Rybelsus / [employer] Patient application portion:  Education officer, museum portion:  N/A embedded [employer] RPh to have signed while in clinic to Dr. [name] Provider address/fax verified via: Office website    Follow up:  Will follow up with patient in 5-15 business days to confirm application(s) have been received.  Haylee Mcanany P. Everett Ricciardelli, [place]  [phone]

## 2020-01-09 ENCOUNTER — Ambulatory Visit: Payer: Self-pay | Admitting: Pharmacist

## 2020-01-09 NOTE — Chronic Care Management (AMB) (Signed)
  Chronic Care Management   Note  01/09/2020 Name: NIHAR KLUS MRN: [id number] DOB: [birth year]  [name] is a 69 y.o. year old male who is a primary care patient of McLean-Scocuzza, Nino Glow, MD. The CCM team was consulted for assistance with chronic disease management and care coordination needs.    Contacted patient to notify that we have Rybelsus samples available, and to discuss hypercholesterolemia pharmacotherapy. May pursue coverage for Vascepa and funding assistance through [employer]. Left voicemail for patient to return my call at his convenience.    Follow up plan: - Will await return call  [name], PharmD, [place], [place] [phone]

## 2020-01-12 ENCOUNTER — Telehealth: Payer: Self-pay | Admitting: Pharmacist

## 2020-01-12 ENCOUNTER — Ambulatory Visit: Payer: Medicare Other | Admitting: Pharmacist

## 2020-01-12 DIAGNOSIS — E782 Mixed hyperlipidemia: Secondary | ICD-10-CM

## 2020-01-12 DIAGNOSIS — E1165 Type 2 diabetes mellitus with hyperglycemia: Secondary | ICD-10-CM

## 2020-01-12 MED ORDER — ICOSAPENT ETHYL 1 G PO CAPS: 2.0000 g | ORAL_CAPSULE | Freq: Two times a day (BID) | ORAL | 2 refills | Status: DC

## 2020-01-12 NOTE — Chronic Care Management (AMB) (Signed)
Chronic Care Management   Follow Up Note   01/12/2020 Name: Kevin Ryan MRN: [id number] DOB: October 06, 1950  Referred by: McLean-Scocuzza, Kevin Glow, MD Reason for referral : Chronic Care Management (Medication Management)   Kevin Ryan is a 69 y.o. year old male who is a primary care patient of McLean-Scocuzza, Kevin Glow, MD. The CCM team was consulted for assistance with chronic disease management and care coordination needs.    Contacted patient for medication management review.   Review of patient status, including review of consultants reports, relevant laboratory and other test results, and collaboration with appropriate care team members and the patient's provider was performed as part of comprehensive patient evaluation and provision of chronic care management services.    SDOH (Social Determinants of Health) assessments performed: Yes See Care Plan activities for detailed interventions related to [name]     Outpatient Encounter Medications as of 01/12/2020  Medication Sig  . aspirin 81 MG tablet Take 81 mg by mouth daily.  . Cholecalciferol (VITAMIN D3) 2000 units TABS Take by mouth.  . clopidogrel (PLAVIX) 75 MG tablet Take 75 mg by mouth daily.  . Dapagliflozin-metFORMIN HCl ER (XIGDUO XR) 04-999 MG TB24 Take 1 tablet by mouth in the morning. Stop jardiance and metformin xr 1000 mg qam and 500 mg qpm  . ezetimibe (ZETIA) 10 MG tablet Take 1 tablet (10 mg total) by mouth daily.  Marland Kitchen glipiZIDE (GLIPIZIDE XL) 5 MG 24 hr tablet Take 1 tablet (5 mg total) by mouth daily with breakfast.  . icosapent Ethyl (VASCEPA) 1 g capsule Take 2 capsules (2 g total) by mouth 2 (two) times daily.  Marland Kitchen lisinopril (ZESTRIL) 2.5 MG tablet Take 1 tablet (2.5 mg total) by mouth daily.  Marland Kitchen MAGNESIUM PO Take by mouth daily.  . meclizine (ANTIVERT) 25 MG tablet Take 25 mg by mouth 3 (three) times daily as needed for dizziness.  . meloxicam (MOBIC) 15 MG tablet Take 1 tablet (15 mg total) by mouth daily.  .  metoprolol tartrate (LOPRESSOR) 50 MG tablet Take 1 tablet (50 mg total) by mouth 2 (two) times daily.  . Probiotic Product (PROBIOTIC DAILY PO) Take by mouth.  . rosuvastatin (CRESTOR) 40 MG tablet Take 1 tablet (40 mg total) by mouth at bedtime.  . Semaglutide (RYBELSUS) 3 MG TABS Take 3 mg by mouth daily. Take on an empty stomach and wait 30 minutes before meal  . Testosterone 20.25 MG/ACT (1.62%) GEL Apply 1 pump to each shoulder daily  . vitamin B-12 (CYANOCOBALAMIN) 500 MCG tablet Take 5,000 mcg by mouth daily.   . [DISCONTINUED] omega-3 acid ethyl esters (LOVAZA) 1 g capsule Take 2 capsules (2 g total) by mouth 2 (two) times daily. (Patient not taking: Reported on 01/02/2020)  . [DISCONTINUED] Omega-3 Fatty Acids (FISH OIL) 1000 MG CAPS Take 1,000 mg by mouth in the morning and at bedtime.    No facility-administered encounter medications on file as of 01/12/2020.     Objective:   Goals Addressed              This Visit's Progress     Patient Stated   .  PharmD "I can't afford this medication" (pt-stated)        CARE PLAN ENTRY (see longtitudinal plan of care for additional care plan information)  Current Barriers:  . Diabetes: uncontrolled; complicated by chronic medical conditions including CAD, HTN, HLD, fatty liver, most recent A1c 8.1% . Most recent eGFR: ~85 mL/min . Current antihyperglycemic regimen:  Xigduo XR 04/999 mg QAM; glipizide XL 5 mg QAM, Rybelsus 3 mg daily prescribed - has not started o Denied starting GLP1 w/ most recent elevated A1c . Current fasting readings:  o Fastings: 150-160s o Not checking 2 hour post prandials  . Cardiovascular risk reduction: o Current hypertensive regimen: lisinopril 2.5 mg QAM, metoprolol tartrate 50 mg BID  o Current hyperlipidemia regimen: ezetimibe 10 mg daily, rosuvastatin 20 mg daily, OTC omega 3 fatty acids 1 g BID -> discussed w/ PCP that prescription EPA preferred o Current antiplatelet regimen: ASA 81, clopidogrel 75  mg daily   Pharmacist Clinical Goal(s):  Marland Kitchen Over the next 90 days, patient will work with PharmD and primary care provider to address optimized medication management  Interventions: . Comprehensive medication review performed, medication list updated in electronic medical record . Inter-disciplinary care team collaboration (see longitudinal plan of care) . Rybelsus 3 mg sample prepared for patient. He will come by the office to pick this up. He will also complete and return the patient assistance paperwork for Rybelsus . Discussed attempting coverage for Vascepa, as we could pursue [name] for hyperlipidemia to assist with the cost of the copay. Patient agreeable to this. Have sent prescription for Vascepa 2 g BID to pharmacy. Will collaborate w/ CMA to complete PA, if required. Providing information to patient about contacting [name] to apply for Hyperlipidemia grant funding, which will help towards the copay for [name], rosuvastatin, and ezetimibe.   Patient Self Care Activities:  . Patient will check blood glucose daily, document, and provide at future appointments . Patient will take medications as prescribed . Patient will report any questions or concerns to provider   Please see past updates related to this goal by clicking on the "Past Updates" button in the selected goal          Plan:  - Will collaborate w/ patient, provider, and [name] as above  [name], PharmD, [place], [place] Pharmacist [facility] [phone]

## 2020-01-12 NOTE — Patient Instructions (Addendum)
Kevin Ryan,   1) Start Rybelsus 3 mg daily. Take this medication on an empty stomach. Wait at least 30 minutes before eating or taking other medications. The medication will not be properly absorbed if you take with food or other medications. We will continue to work together on securing approval for patient assistance for this medication from the manufacturer.   2) I  have sent a prescription for a different prescription "fish oil", called Vascepa. We may need to work with your insurance for approval. Dennis Bast can apply for "grant funding" assistance through the [employer] (https://www.healthwellfoundation.org/fund/hypercholesterolemia-medicare-access/). Call them at  [phone] 302-780-6413 and discuss that you need assistanc with the copays for your hyperlipidemia medications (though rosuvastatin and ezetimibe are inexpensive, let them know that you are on them, as well as Vascepa, and the funding will cover all of the copays). Call me with any questions. This process takes ~15 minutes. When I called this afternoon with another patient, they had a long wait time, so I recommend you call in the morning!  Call me with questions! Thanks!  [name], PharmD [phone]  Visit Information  Goals Addressed              This Visit's Progress     Patient Stated   .  PharmD "I can't afford this medication" (pt-stated)        CARE PLAN ENTRY (see longtitudinal plan of care for additional care plan information)  Current Barriers:  . Diabetes: uncontrolled; complicated by chronic medical conditions including CAD, HTN, HLD, fatty liver, most recent A1c 8.1% . Most recent eGFR: ~85 mL/min . Current antihyperglycemic regimen: Xigduo XR 04/999 mg QAM; glipizide XL 5 mg QAM, Rybelsus 3 mg daily prescribed - has not started o Denied starting GLP1 w/ most recent elevated A1c . Current fasting readings:  o Fastings: 150-160s o Not checking 2 hour post prandials  . Cardiovascular risk  reduction: o Current hypertensive regimen: lisinopril 2.5 mg QAM, metoprolol tartrate 50 mg BID  o Current hyperlipidemia regimen: ezetimibe 10 mg daily, rosuvastatin 20 mg daily, OTC omega 3 fatty acids 1 g BID -> discussed w/ PCP that prescription EPA preferred o Current antiplatelet regimen: ASA 81, clopidogrel 75 mg daily   Pharmacist Clinical Goal(s):  Marland Kitchen Over the next 90 days, patient will work with PharmD and primary care provider to address optimized medication management  Interventions: . Comprehensive medication review performed, medication list updated in electronic medical record . Inter-disciplinary care team collaboration (see longitudinal plan of care) . Rybelsus 3 mg sample prepared for patient. He will come by the office to pick this up. He will also complete and return the patient assistance paperwork for Rybelsus . Discussed attempting coverage for Vascepa, as we could pursue [place] funding for hyperlipidemia to assist with the cost of the copay. Patient agreeable to this. Have sent prescription for Vascepa 2 g BID to pharmacy. Will collaborate w/ CMA to complete PA, if required. Providing information to patient about contacting [place] to apply for Hyperlipidemia grant funding, which will help towards the copay for [name], rosuvastatin, and ezetimibe.   Patient Self Care Activities:  . Patient will check blood glucose daily, document, and provide at future appointments . Patient will take medications as prescribed . Patient will report any questions or concerns to provider   Please see past updates related to this goal by clicking on the "Past Updates" button in the selected goal         The patient  verbalized understanding of instructions provided today and declined a print copy of patient instruction materials.   Plan:  - Will collaborate w/ patient, provider, and [name] as above  [name], PharmD, [place], [place] [phone]

## 2020-01-12 NOTE — Telephone Encounter (Signed)
Medication Samples have been pulled, labeled, and documented for this patient  Drug name: Rybelsus     Strength: 3 mg        Qty: 30  LOT: [id number]  Exp.Date: 09/2021  Dosing instructions: Take 1 tablet by mouth every morning on an empty stomach   Sample placed up front for pick up, along with paperwork. Routing to Therapist, sports and CMA for [employer]

## 2020-01-13 NOTE — Telephone Encounter (Signed)
Noted, For your information

## 2020-01-15 ENCOUNTER — Other Ambulatory Visit: Payer: Self-pay | Admitting: Pharmacy Technician

## 2020-01-15 NOTE — Patient Outreach (Signed)
[facility]) Care Management  01/15/2020  MAUDE GLOOR May 29, 1951 [id number]  ADDENDUM   Incoming call from patient regarding patient assistance application(s) for Rybelsus with [employer] , HIPAA identifiers verified.   Patient informed he completed the application today and is going to bring it by the office for embedded [place] and will also pick up his samples at that time.  Follow up:  Will route note to embedded [facility] for case closure if document(s) have not been received in the next 15 business days.  Siriah Treat P. Marbin Olshefski, [place]  [phone]

## 2020-01-15 NOTE — Telephone Encounter (Signed)
What about vascepa?   Thanks [employer]

## 2020-01-15 NOTE — Patient Outreach (Signed)
[facility]) Care Management  01/15/2020  Kevin Ryan Dec 28, 1950 [id number]  Unsuccessful call placed to patient regarding patient assistance application(s) for Rybelsus with [employer] , HIPAA compliant voicemail left.   Was calling to inquire if patient received the application that was mailed to him on 01/06/2020.  Follow up:  Will follow up with patient in 5-7 business days if call is not returned.  Chavis [name], [place]  [phone]

## 2020-01-15 NOTE — Telephone Encounter (Signed)
Patient picked up medication

## 2020-01-16 ENCOUNTER — Ambulatory Visit: Payer: Medicare Other | Admitting: Pharmacist

## 2020-01-16 DIAGNOSIS — I2510 Atherosclerotic heart disease of native coronary artery without angina pectoris: Secondary | ICD-10-CM

## 2020-01-16 DIAGNOSIS — E1165 Type 2 diabetes mellitus with hyperglycemia: Secondary | ICD-10-CM

## 2020-01-16 DIAGNOSIS — E7849 Other hyperlipidemia: Secondary | ICD-10-CM

## 2020-01-16 NOTE — Chronic Care Management (AMB) (Signed)
Chronic Care Management   Follow Up Note   01/16/2020 Name: Kevin Ryan MRN: [id number] DOB: [birth year]  Referred by: McLean-Scocuzza, Nino Glow, MD Reason for referral : Chronic Care Management (Medication Management)   Kevin Ryan is a 69 y.o. year old male who is a primary care patient of McLean-Scocuzza, Nino Glow, MD. The CCM team was consulted for assistance with chronic disease management and care coordination needs.    Care coordination completed today.  Review of patient status, including review of consultants reports, relevant laboratory and other test results, and collaboration with appropriate care team members and the patient's provider was performed as part of comprehensive patient evaluation and provision of chronic care management services.    SDOH (Social Determinants of Health) assessments performed: Yes See Care Plan activities for detailed interventions related to [facility])     Outpatient Encounter Medications as of 01/16/2020  Medication Sig  . aspirin 81 MG tablet Take 81 mg by mouth daily.  . Cholecalciferol (VITAMIN D3) 2000 units TABS Take by mouth.  . clopidogrel (PLAVIX) 75 MG tablet Take 75 mg by mouth daily.  . Dapagliflozin-metFORMIN HCl ER (XIGDUO XR) 04-999 MG TB24 Take 1 tablet by mouth in the morning. Stop jardiance and metformin xr 1000 mg qam and 500 mg qpm  . ezetimibe (ZETIA) 10 MG tablet Take 1 tablet (10 mg total) by mouth daily.  Marland Kitchen glipiZIDE (GLIPIZIDE XL) 5 MG 24 hr tablet Take 1 tablet (5 mg total) by mouth daily with breakfast.  . icosapent Ethyl (VASCEPA) 1 g capsule Take 2 capsules (2 g total) by mouth 2 (two) times daily.  Marland Kitchen lisinopril (ZESTRIL) 2.5 MG tablet Take 1 tablet (2.5 mg total) by mouth daily.  Marland Kitchen MAGNESIUM PO Take by mouth daily.  . meclizine (ANTIVERT) 25 MG tablet Take 25 mg by mouth 3 (three) times daily as needed for dizziness.  . meloxicam (MOBIC) 15 MG tablet Take 1 tablet (15 mg total) by mouth daily.  . metoprolol  tartrate (LOPRESSOR) 50 MG tablet Take 1 tablet (50 mg total) by mouth 2 (two) times daily.  . Probiotic Product (PROBIOTIC DAILY PO) Take by mouth.  . rosuvastatin (CRESTOR) 40 MG tablet Take 1 tablet (40 mg total) by mouth at bedtime.  . Semaglutide (RYBELSUS) 3 MG TABS Take 3 mg by mouth daily. Take on an empty stomach and wait 30 minutes before meal  . Testosterone 20.25 MG/ACT (1.62%) GEL Apply 1 pump to each shoulder daily  . vitamin B-12 (CYANOCOBALAMIN) 500 MCG tablet Take 5,000 mcg by mouth daily.    No facility-administered encounter medications on file as of 01/16/2020.     Objective:   Goals Addressed              This Visit's Progress     Patient Stated   .  PharmD "I can't afford this medication" (pt-stated)        CARE PLAN ENTRY (see longtitudinal plan of care for additional care plan information)  Current Barriers:  . Diabetes: uncontrolled; complicated by chronic medical conditions including CAD, HTN, HLD, fatty liver, most recent A1c 8.1% o Working on appropriate coverage for TG therapy  . Most recent eGFR: ~85 mL/min . Current antihyperglycemic regimen: Xigduo XR 04/999 mg QAM; glipizide XL 5 mg QAM, Rybelsus 3 mg daily . Current fasting readings:  o Fastings: 150-160s o Not checking 2 hour post prandials  . Cardiovascular risk reduction: o Current hypertensive regimen: lisinopril 2.5 mg QAM, metoprolol tartrate 50 mg  BID  o Current hyperlipidemia regimen: ezetimibe 10 mg daily, rosuvastatin 20 mg daily, OTC omega 3 fatty acids 1 g BID -> discussed w/ PCP that prescription EPA preferred, Vascepa 2 g BID prescribed o Current antiplatelet regimen: ASA 81, clopidogrel 75 mg daily   Pharmacist Clinical Goal(s):  Marland Kitchen Over the next 90 days, patient will work with PharmD and primary care provider to address optimized medication management  Interventions: . Comprehensive medication review performed, medication list updated in electronic medical  record . Inter-disciplinary care team collaboration (see longitudinal plan of care) . Contacted [employer]. PA was not required for Vascepa, but cost is ~$350. Provided patient with information about how to apply for [employer] support. Patient is going to call the foundation to secure grant funding to cover the copay for Vascepa, ezetimibe, and rosuvastatin. He will call me back with any questions or concerns. F/u with PCP in 2 weeks  Patient Self Care Activities:  . Patient will check blood glucose daily, document, and provide at future appointments . Patient will take medications as prescribed . Patient will report any questions or concerns to provider   Please see past updates related to this goal by clicking on the "Past Updates" button in the selected goal          Plan:  - F/u as previously scheduled  [name], PharmD, [place], [place] Pharmacist [place] [phone]

## 2020-01-16 NOTE — Patient Instructions (Signed)
Visit Information  Goals Addressed              This Visit's Progress     Patient Stated   .  PharmD "I can't afford this medication" (pt-stated)        CARE PLAN ENTRY (see longtitudinal plan of care for additional care plan information)  Current Barriers:  . Diabetes: uncontrolled; complicated by chronic medical conditions including CAD, HTN, HLD, fatty liver, most recent A1c 8.1% o Working on appropriate coverage for TG therapy  . Most recent eGFR: ~85 mL/min . Current antihyperglycemic regimen: Xigduo XR 04/999 mg QAM; glipizide XL 5 mg QAM, Rybelsus 3 mg daily . Current fasting readings:  o Fastings: 150-160s o Not checking 2 hour post prandials  . Cardiovascular risk reduction: o Current hypertensive regimen: lisinopril 2.5 mg QAM, metoprolol tartrate 50 mg BID  o Current hyperlipidemia regimen: ezetimibe 10 mg daily, rosuvastatin 20 mg daily, OTC omega 3 fatty acids 1 g BID -> discussed w/ PCP that prescription EPA preferred, Vascepa 2 g BID prescribed o Current antiplatelet regimen: ASA 81, clopidogrel 75 mg daily   Pharmacist Clinical Goal(s):  Marland Kitchen Over the next 90 days, patient will work with PharmD and primary care provider to address optimized medication management  Interventions: . Comprehensive medication review performed, medication list updated in electronic medical record . Inter-disciplinary care team collaboration (see longitudinal plan of care) . Contacted [facility]. PA was not required for Vascepa, but cost is ~$350. Provided patient with information about how to apply for [employer] support. Patient is going to call the foundation to secure grant funding to cover the copay for Vascepa, ezetimibe, and rosuvastatin. He will call me back with any questions or concerns. F/u with PCP in 2 weeks  Patient Self Care Activities:  . Patient will check blood glucose daily, document, and provide at future appointments . Patient will take medications  as prescribed . Patient will report any questions or concerns to provider   Please see past updates related to this goal by clicking on the "Past Updates" button in the selected goal         The patient verbalized understanding of instructions provided today and declined a print copy of patient instruction materials.   Plan:  - F/u as previously scheduled  [name], PharmD, [place], [place] Pharmacist [place] [phone]

## 2020-01-16 NOTE — Telephone Encounter (Signed)
Working on affordability options for that now!  Catie

## 2020-01-18 ENCOUNTER — Other Ambulatory Visit: Payer: Self-pay | Admitting: Internal Medicine

## 2020-01-20 ENCOUNTER — Other Ambulatory Visit: Payer: Medicare Other

## 2020-01-20 ENCOUNTER — Other Ambulatory Visit: Payer: Self-pay

## 2020-01-20 DIAGNOSIS — E291 Testicular hypofunction: Secondary | ICD-10-CM

## 2020-01-21 ENCOUNTER — Telehealth: Payer: Self-pay | Admitting: Urology

## 2020-01-21 ENCOUNTER — Other Ambulatory Visit: Payer: Self-pay | Admitting: Pharmacy Technician

## 2020-01-21 LAB — TESTOSTERONE: Testosterone: 305 ng/dL (ref 264–916)

## 2020-01-21 NOTE — Patient Outreach (Addendum)
[facility]) Care Management  01/21/2020  Kevin Ryan 08-Dec-1950 [id number]   Received both patient and provider portion(s) of patient assistance application(s) for Rybelsus. Faxed completed application and required documents into [employer].  Will follow up with company(ies) in 2-5 business days to check status of application(s).  [name] P. Memory Heinrichs, [place]  [phone]

## 2020-01-21 NOTE — Telephone Encounter (Signed)
Testosterone level better at 305.  Please check to see if he has noted any symptom improvement.

## 2020-01-23 ENCOUNTER — Other Ambulatory Visit: Payer: Self-pay | Admitting: Pharmacy Technician

## 2020-01-23 NOTE — Patient Outreach (Signed)
[facility]) Care Management  01/23/2020  Kevin Ryan 15-Dec-1950 [id number]  Care coordination call placed to [name] in regards to Rybelsus application.  Spoke to [name] who informed patient was APPROVED 01/23/2020-06/29/2020. Patient will be receiving a 60 days supply delivered to the provider's office in the next 10-14 business days. Patient's next refill can be ordered 45 days from today.  Will follow up with patient in 10-20 business days to inquire if medication was received.  Kjersten Ormiston P. Naeem Quillin, [place]  [phone]

## 2020-01-23 NOTE — Telephone Encounter (Signed)
Notified patient as instructed, Left message

## 2020-01-27 ENCOUNTER — Ambulatory Visit (INDEPENDENT_AMBULATORY_CARE_PROVIDER_SITE_OTHER): Payer: Medicare Other | Admitting: Internal Medicine

## 2020-01-27 ENCOUNTER — Other Ambulatory Visit: Payer: Self-pay

## 2020-01-27 ENCOUNTER — Ambulatory Visit: Payer: Self-pay | Admitting: Pharmacist

## 2020-01-27 ENCOUNTER — Encounter: Payer: Self-pay | Admitting: Internal Medicine

## 2020-01-27 VITALS — BP 126/68 | HR 90 | Temp 98.0°F | Ht 72.99 in | Wt 252.4 lb

## 2020-01-27 DIAGNOSIS — E7849 Other hyperlipidemia: Secondary | ICD-10-CM | POA: Diagnosis not present

## 2020-01-27 DIAGNOSIS — E669 Obesity, unspecified: Secondary | ICD-10-CM | POA: Diagnosis not present

## 2020-01-27 DIAGNOSIS — E782 Mixed hyperlipidemia: Secondary | ICD-10-CM | POA: Diagnosis not present

## 2020-01-27 DIAGNOSIS — E785 Hyperlipidemia, unspecified: Secondary | ICD-10-CM | POA: Diagnosis not present

## 2020-01-27 DIAGNOSIS — E1159 Type 2 diabetes mellitus with other circulatory complications: Secondary | ICD-10-CM

## 2020-01-27 DIAGNOSIS — E1165 Type 2 diabetes mellitus with hyperglycemia: Secondary | ICD-10-CM

## 2020-01-27 DIAGNOSIS — M25511 Pain in right shoulder: Secondary | ICD-10-CM | POA: Diagnosis not present

## 2020-01-27 DIAGNOSIS — I10 Essential (primary) hypertension: Secondary | ICD-10-CM

## 2020-01-27 DIAGNOSIS — I700 Atherosclerosis of aorta: Secondary | ICD-10-CM | POA: Insufficient documentation

## 2020-01-27 DIAGNOSIS — J439 Emphysema, unspecified: Secondary | ICD-10-CM | POA: Insufficient documentation

## 2020-01-27 DIAGNOSIS — I152 Hypertension secondary to endocrine disorders: Secondary | ICD-10-CM

## 2020-01-27 DIAGNOSIS — G8929 Other chronic pain: Secondary | ICD-10-CM

## 2020-01-27 HISTORY — DX: Pain in right shoulder: M25.511

## 2020-01-27 NOTE — Patient Instructions (Signed)
Visit Information  Goals Addressed              This Visit's Progress     Patient Stated   .  PharmD "I can't afford this medication" (pt-stated)        CARE PLAN ENTRY (see longtitudinal plan of care for additional care plan information)  Current Barriers:  . Diabetes: uncontrolled; complicated by chronic medical conditions including CAD, HTN, HLD, fatty liver, most recent A1c 8.1% o Received message form PCP that patient had not received [employer] information w/ billing information that pharmacy would need to run card to help pay for Vascepa.  . Most recent eGFR: ~85 mL/min . Current antihyperglycemic regimen: Xigduo XR 04/999 mg QAM; glipizide XL 5 mg QAM, Rybelsus 3 mg daily . Cardiovascular risk reduction: o Current hypertensive regimen: lisinopril 2.5 mg QAM, metoprolol tartrate 50 mg BID  o Current hyperlipidemia regimen: ezetimibe 10 mg daily, rosuvastatin 20 mg daily, OTC omega 3 fatty acids 1 g BID -> discussed w/ PCP that prescription EPA preferred, Vascepa 2 g BID prescribed o Current antiplatelet regimen: ASA 81, clopidogrel 75 mg daily   Pharmacist Clinical Goal(s):  Marland Kitchen Over the next 90 days, patient will work with PharmD and primary care provider to address optimized medication management  Interventions: . Comprehensive medication review performed, medication list updated in electronic medical record . Inter-disciplinary care team collaboration (see longitudinal plan of care) . Called patient. He notes that he called [employer] and received the necessary information. He is going to take it by [facility] pharmacy to run in combination w/ his insurance to reduce the cost of Vascepa to $0. Will call me if any questions or concerns.   Patient Self Care Activities:  . Patient will check blood glucose daily, document, and provide at future appointments . Patient will take medications as prescribed . Patient will report any questions or concerns to  provider   Please see past updates related to this goal by clicking on the "Past Updates" button in the selected goal         The patient verbalized understanding of instructions provided today and declined a print copy of patient instruction materials.   Plan:  - Scheduled f/u call in ~ 8 weeks  [name], PharmD, [place], [place] Pharmacist [place] [phone]

## 2020-01-27 NOTE — Progress Notes (Signed)
Chief Complaint  Patient presents with  . Follow-up    Pt would like to decrease blood sugar. Right shoulder pain, has a visit with Ortho on Thursday after injuring left shoulder during a fall  . medication question    Pt asks if he can take old metformin with the current xigduo to bring blood sugar levels down.   F/u  1. DM 2 cbgs in the am 160s on rybelsus 3 mg qd, xigduo xr 04-999 not taking glipizide 5 mg xl due for upcoming labs 2. HLD elevated trigs on zetia 10, crestor 40 and generic vascepa  3. Chronic right shoulder pain f/u Dr. Veverly Fells [facility] 01/29/20   Review of Systems  Constitutional: Negative for weight loss.  HENT: Negative for hearing loss.   Eyes: Negative for blurred vision.  Respiratory: Negative for shortness of breath.   Cardiovascular: Negative for chest pain.  Gastrointestinal: Negative for abdominal pain.  Musculoskeletal: Positive for joint pain.  Skin: Negative for rash.  Neurological: Negative for headaches.  Psychiatric/Behavioral: Negative for depression.   Past Medical History:  Diagnosis Date  . Abnormal CT of the abdomen   . Anemia    decreased platelets in last blood work  . 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 CAD (coronary artery disease)    s/p MI  . CAD (coronary artery disease)    s/p stent x 1 2004   . Cancer (HCC)    spindle cell stomach   . 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 Gastric mass 04/12/2015   cancer; resected  . History of kidney stones   . Hypercholesterolemia   . Hypertension   . Myocardial infarction ([place]) 2004  . Personal history of tobacco use, presenting hazards to health 07/13/2015  . Pneumonia   . Sleep apnea    CPAP  . 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 Vertigo    none in approx 1 yr  . Wears dentures    partial upper  . Wears hearing aid in both ears    Past Surgical History:  Procedure Laterality Date  . BACK SURGERY     01/29/18 [facility] lower  back   . CARDIAC  CATHETERIZATION  2004   1 stent  . CARPAL TUNNEL RELEASE Bilateral   . CATARACT EXTRACTION W/PHACO Left 06/04/2019   Procedure: CATARACT EXTRACTION PHACO AND INTRAOCULAR LENS PLACEMENT (IOC) LEFT DIABETIC 01:08.3  18.6%  12.76;  Surgeon: Leandrew Koyanagi, MD;  Location: [facility];  Service: Ophthalmology;  Laterality: Left;  diabetic - oral meds sleep apnea  . CATARACT EXTRACTION W/PHACO Right 07/02/2019   Procedure: CATARACT EXTRACTION PHACO AND INTRAOCULAR LENS PLACEMENT ([place]) Right DIABETIC;  Surgeon: Leandrew Koyanagi, MD;  Location: [facility];  Service: Ophthalmology;  Laterality: Right;  diabetic-oral med sleep apnea (CPAP)  . COLONOSCOPY WITH PROPOFOL N/A 03/26/2015   Procedure: COLONOSCOPY WITH PROPOFOL;  Surgeon: Josefine Class, MD;  Location[facility] ENDOSCOPY;  Service: Endoscopy;  Laterality: N/A;  . KNEE SURGERY     TKR  . KNEE SURGERY     b/l partial knee Dr. Leanna Sato   . PARTIAL GASTRECTOMY     2016 [facility]   Family History  Problem Relation Age of Onset  . Diabetes Mellitus II Maternal Grandfather   . Heart disease Maternal Grandfather   . CAD Paternal Grandfather   . Cancer Paternal Grandfather        ? type  . Arthritis  Mother   . Depression Mother   . Hearing loss Mother   . Diabetes Father   . Cancer Brother        throat smoker   . Hearing loss Maternal Grandmother   . Arthritis Maternal Grandmother    Social History   Socioeconomic History  . Marital status: Married    Spouse name: Not on file  . Number of children: Not on file  . Years of education: Not on file  . Highest education level: Not on file  Occupational History  . Not on file  Tobacco Use  . Smoking status: Current Every Day Smoker    Packs/day: 0.50    Years: 50.00    Pack years: 25.00    Types: Cigarettes  . Smokeless tobacco: Never Used  . Tobacco comment: 1 ppd or a little less (since age 56)  Vaping Use  . Vaping Use: Former  Substance and Sexual  Activity  . Alcohol use: No    Comment: may have drink 3-4x/yr  . Drug use: No  . Sexual activity: Yes    Birth control/protection: None  Other Topics Concern  . Not on file  Social History Narrative   Owns guns    Wears seat belt    Safe in relationship    Married 2 sons    -married 63 years as of 09/2019    On disability    Assoc degree and prev supervisor    Retired as of 09/2019 end of month       Social Determinants of Health   Financial Resource Strain: High Risk  . Difficulty of Paying Living Expenses: Hard  Food Insecurity:   . Worried About Charity fundraiser in the Last Year:   . Arboriculturist in the Last Year:   Transportation Needs:   . Film/video editor (Medical):   Marland Kitchen Lack of Transportation (Non-Medical):   Physical Activity:   . Days of Exercise per Week:   . Minutes of Exercise per Session:   Stress:   . Feeling of Stress :   Social Connections:   . Frequency of Communication with Friends and Family:   . Frequency of Social Gatherings with Friends and Family:   . Attends Religious Services:   . Active Member of Clubs or Organizations:   . Attends Archivist Meetings:   Marland Kitchen Marital Status:   Intimate Partner Violence:   . Fear of Current or Ex-Partner:   . Emotionally Abused:   Marland Kitchen Physically Abused:   . Sexually Abused:    Current Meds  Medication Sig  . aspirin 81 MG tablet Take 81 mg by mouth daily.  . Cholecalciferol (VITAMIN D3) 2000 units TABS Take by mouth.  . clopidogrel (PLAVIX) 75 MG tablet Take 75 mg by mouth daily.  Marland Kitchen ezetimibe (ZETIA) 10 MG tablet Take 1 tablet (10 mg total) by mouth daily.  Marland Kitchen icosapent Ethyl (VASCEPA) 1 g capsule Take 2 capsules (2 g total) by mouth 2 (two) times daily.  Marland Kitchen lisinopril (ZESTRIL) 2.5 MG tablet Take 1 tablet (2.5 mg total) by mouth daily.  Marland Kitchen MAGNESIUM PO Take by mouth daily.  . meclizine (ANTIVERT) 25 MG tablet Take 25 mg by mouth 3 (three) times daily as needed for dizziness.  . meloxicam  (MOBIC) 15 MG tablet Take 1 tablet (15 mg total) by mouth daily.  . metoprolol tartrate (LOPRESSOR) 50 MG tablet Take 1 tablet (50 mg total) by mouth 2 (two) times  daily.  Glory Rosebush ULTRA test strip 1 each 3 (three) times daily.  . Probiotic Product (PROBIOTIC DAILY PO) Take by mouth.  . rosuvastatin (CRESTOR) 40 MG tablet Take 1 tablet (40 mg total) by mouth at bedtime.  . Testosterone 20.25 MG/ACT (1.62%) GEL Apply 1 pump to each shoulder daily  . vitamin B-12 (CYANOCOBALAMIN) 500 MCG tablet Take 5,000 mcg by mouth daily.   . [DISCONTINUED] Dapagliflozin-metFORMIN HCl ER (XIGDUO XR) 04-999 MG TB24 Take 1 tablet by mouth in the morning. Stop jardiance and metformin xr 1000 mg qam and 500 mg qpm  . [DISCONTINUED] Semaglutide (RYBELSUS) 3 MG TABS Take 3 mg by mouth daily. Take on an empty stomach and wait 30 minutes before meal   Allergies  Allergen Reactions  . Ibuprofen Itching and Rash   Recent Results (from the past 2160 hour(s))  Luteinizing hormone     Status: None   Collection Time: 12/10/19 11:15 AM  Result Value Ref Range   LH 4.5 1.7 - 8.6 mIU/mL  Testosterone     Status: None   Collection Time: 01/20/20  1:05 PM  Result Value Ref Range   Testosterone 305 264 - 916 ng/dL    Comment: Adult male reference interval is based on a population of healthy nonobese males (BMI <30) between 98 and 1 years old. [place], [place] [id number]. PMID: [id number].   Hemoglobin A1c     Status: Abnormal   Collection Time: 01/30/20  9:18 AM  Result Value Ref Range   Hgb A1c MFr Bld 8.2 (H) 4.6 - 6.5 %    Comment: Glycemic Control Guidelines for People with Diabetes:Non Diabetic:  <6%Goal of Therapy: <7%Additional Action Suggested:  >8%   CBC with Differential/Platelet     Status: Abnormal   Collection Time: 01/30/20  9:18 AM  Result Value Ref Range   WBC 8.6 4.0 - 10.5 K/uL   RBC 4.78 4.22 - 5.81 Mil/uL   Hemoglobin 15.9 13.0 - 17.0 g/dL   HCT 47.1 39 - 52 %   MCV 98.4 78.0 -  100.0 fl   MCHC 33.8 30.0 - 36.0 g/dL   RDW 14.4 11.5 - 15.5 %   Platelets 143.0 (L) 150 - 400 K/uL   Neutrophils Relative % 63.6 43 - 77 %   Lymphocytes Relative 25.5 12 - 46 %   Monocytes Relative 6.5 3 - 12 %   Eosinophils Relative 3.3 0 - 5 %   Basophils Relative 1.1 0 - 3 %   Neutro Abs 5.4 1.4 - 7.7 K/uL   Lymphs Abs 2.2 0.7 - 4.0 K/uL   Monocytes Absolute 0.6 0 - 1 K/uL   Eosinophils Absolute 0.3 0 - 0 K/uL   Basophils Absolute 0.1 0 - 0 K/uL  Comprehensive metabolic panel     Status: Abnormal   Collection Time: 01/30/20  9:18 AM  Result Value Ref Range   Sodium 137 135 - 145 mEq/L   Potassium 4.3 3.5 - 5.1 mEq/L   Chloride 100 96 - 112 mEq/L   CO2 28 19 - 32 mEq/L   Glucose, Bld 179 (H) 70 - 99 mg/dL   BUN 21 6 - 23 mg/dL   Creatinine, Ser 0.96 0.40 - 1.50 mg/dL   Total Bilirubin 0.5 0.2 - 1.2 mg/dL   Alkaline Phosphatase 84 39 - 117 U/L   AST 30 0 - 37 U/L   ALT 30 0 - 53 U/L   Total Protein 7.5 6.0 - 8.3 g/dL   Albumin  4.6 3.5 - 5.2 g/dL   GFR 77.55 >60.00 mL/min   Calcium 9.6 8.4 - 10.5 mg/dL  Lipid panel     Status: Abnormal   Collection Time: 01/30/20  9:18 AM  Result Value Ref Range   Cholesterol 122 0 - 200 mg/dL    Comment: ATP III Classification       Desirable:  < 200 mg/dL               Borderline High:  200 - 239 mg/dL          High:  > = 240 mg/dL   Triglycerides 392.0 (H) 0 - 149 mg/dL    Comment: Normal:  <150 mg/dLBorderline High:  150 - 199 mg/dL   HDL 31.40 (L) >39.00 mg/dL   VLDL 78.4 (H) 0.0 - 40.0 mg/dL   Total CHOL/HDL Ratio 4     Comment:                Men          Women1/2 Average Risk     3.4          3.3Average Risk          5.0          4.42X Average Risk          9.6          7.13X Average Risk          15.0          11.0                       NonHDL 90.88     Comment: NOTE:  Non-HDL goal should be 30 mg/dL higher than patient's LDL goal (i.e. LDL goal of < 70 mg/dL, would have non-HDL goal of < 100 mg/dL)  LDL cholesterol, direct      Status: None   Collection Time: 01/30/20  9:18 AM  Result Value Ref Range   Direct LDL 52.0 mg/dL    Comment: Optimal:  <100 mg/dLNear or Above Optimal:  100-129 mg/dLBorderline High:  130-159 mg/dLHigh:  160-189 mg/dLVery High:  >190 mg/dL  HM DIABETES EYE EXAM     Status: None   Collection Time: 02/18/20 12:00 AM  Result Value Ref Range   HM Diabetic Eye Exam No Retinopathy No Retinopathy    Comment: [place] [facility] Dr. Wallace Going   Objective  Body mass index is 33.31 kg/m. Wt Readings from Last 3 Encounters:  01/27/20 252 lb 6.4 oz (114.5 kg)  12/10/19 250 lb (113.4 kg)  12/10/19 250 lb (113.4 kg)   Temp Readings from Last 3 Encounters:  01/27/20 98 F (36.7 C)  12/10/19 98 F (36.7 C) (Oral)  07/02/19 97.7 F (36.5 C)   BP Readings from Last 3 Encounters:  01/27/20 126/68  12/10/19 (!) 114/56  12/10/19 (!) 145/54   Pulse Readings from Last 3 Encounters:  01/27/20 90  12/10/19 85  12/10/19 82    Physical Exam Vitals and nursing note reviewed.  Constitutional:      Appearance: Normal appearance. He is well-developed and well-groomed. He is obese.  HENT:     Head: Normocephalic and atraumatic.  Eyes:     Conjunctiva/sclera: Conjunctivae normal.     Pupils: Pupils are equal, round, and reactive to light.  Cardiovascular:     Rate and Rhythm: Normal rate and regular rhythm.     Heart sounds: Normal heart sounds. No murmur heard.  Pulmonary:     Effort: Pulmonary effort is normal.     Breath sounds: Normal breath sounds.  Skin:    General: Skin is warm and dry.  Neurological:     General: No focal deficit present.     Mental Status: He is alert and oriented to person, place, and time. Mental status is at baseline.     Gait: Gait normal.     Deep Tendon Reflexes:     Reflex Scores:      Achilles reflexes are 4+ on the right side. Psychiatric:        Attention and Perception: Attention and perception normal.        Mood and Affect: Mood and affect normal.         Speech: Speech normal.        Behavior: Behavior normal. Behavior is cooperative.        Thought Content: Thought content normal.        Cognition and Memory: Cognition and memory normal.        Judgment: Judgment normal.     Assessment  Plan  Hypertension associated with diabetes ([place]) - Plan: Lipid panel, Comprehensive metabolic panel, CBC with Differential/Platelet, Hemoglobin A1c Trying to get HLD and diabetes controlled BP controlled on meds  Cont meds try to cut glipizide 1/2 5 mg=2.5 with food and cont other meds  Increase rybelsus to 7 mg qd and cont xigduo   Other hyperlipidemia Added vascepa generic to zetia 10 and crestor 40   Chronic right shoulder pain F/u D.r Veverly Fells ortho in [place] 01/29/20   Obesity (BMI 30.0-34.9)  rec healthy diet and exercise   HM Flu shot had at work 10/9/19not had yet 2020 Tdap and shingles [facility] -Tdap no recently recordper pt had in 2016 or 2017in ED prevnar utd  pna 23 utd  covid 19 declines pfizer  Consider shingrix  rec hepA/hep Bpt declines h/o fatty liver MMIR immune  H/o colon polyps 03/26/15 polys x 3-4 tubular and hyperplastic repeat in 3-5 years [facility] [place] 03/25/20 [facility]   PSA normal 0.29 06/17/2019   H/o lung nodules consider ct chest in future  -smoker age 65 to present max 1.5 ppd now <1 ppd no FH lung cancer rec cessation -CT chest 10/24/19  IMPRESSION: 1. Lung-RADS 2, benign appearance or behavior. Continue annual screening with low-dose chest CT without contrast in 12 months. 2. Hepatic steatosis. 3.  Emphysema (ICD10-J43.9) and Aortic Atherosclerosis (ICD10-170.0) REC SMOKING CESSATION  Consider dermatology in the futurebut currently no need as of 05/13/2019   H/o riedel [facility] appt 12/2019 H/O SPINDLE CELL CA/GIST TUMOR STOMACH    Provider: Dr. [name]-Internal Medicine

## 2020-01-27 NOTE — Chronic Care Management (AMB) (Signed)
Chronic Care Management   Follow Up Note   01/27/2020 Name: Kevin Ryan MRN: [id number] DOB: 05/13/1951  Referred by: McLean-Scocuzza, Nino Glow, MD Reason for referral : Chronic Care Management (Medication Management )   Kevin Ryan is a 69 y.o. year old male who is a primary care patient of McLean-Scocuzza, Nino Glow, MD. The CCM team was consulted for assistance with chronic disease management and care coordination needs.    Care coordination and medication access needs addressed today.   Review of patient status, including review of consultants reports, relevant laboratory and other test results, and collaboration with appropriate care team members and the patient's provider was performed as part of comprehensive patient evaluation and provision of chronic care management services.    SDOH (Social Determinants of Health) assessments performed: Yes See Care Plan activities for detailed interventions related to [facility])     Outpatient Encounter Medications as of 01/27/2020  Medication Sig  . aspirin 81 MG tablet Take 81 mg by mouth daily.  . Cholecalciferol (VITAMIN D3) 2000 units TABS Take by mouth.  . clopidogrel (PLAVIX) 75 MG tablet Take 75 mg by mouth daily.  . Dapagliflozin-metFORMIN HCl ER (XIGDUO XR) 04-999 MG TB24 Take 1 tablet by mouth in the morning. Stop jardiance and metformin xr 1000 mg qam and 500 mg qpm  . ezetimibe (ZETIA) 10 MG tablet Take 1 tablet (10 mg total) by mouth daily.  Marland Kitchen icosapent Ethyl (VASCEPA) 1 g capsule Take 2 capsules (2 g total) by mouth 2 (two) times daily.  Marland Kitchen lisinopril (ZESTRIL) 2.5 MG tablet Take 1 tablet (2.5 mg total) by mouth daily.  Marland Kitchen MAGNESIUM PO Take by mouth daily.  . meclizine (ANTIVERT) 25 MG tablet Take 25 mg by mouth 3 (three) times daily as needed for dizziness.  . meloxicam (MOBIC) 15 MG tablet Take 1 tablet (15 mg total) by mouth daily.  . metoprolol tartrate (LOPRESSOR) 50 MG tablet Take 1 tablet (50 mg total) by mouth 2 (two)  times daily.  Glory Rosebush ULTRA test strip 1 each 3 (three) times daily.  . Probiotic Product (PROBIOTIC DAILY PO) Take by mouth.  . rosuvastatin (CRESTOR) 40 MG tablet Take 1 tablet (40 mg total) by mouth at bedtime.  . Semaglutide (RYBELSUS) 3 MG TABS Take 3 mg by mouth daily. Take on an empty stomach and wait 30 minutes before meal  . Testosterone 20.25 MG/ACT (1.62%) GEL Apply 1 pump to each shoulder daily  . vitamin B-12 (CYANOCOBALAMIN) 500 MCG tablet Take 5,000 mcg by mouth daily.    No facility-administered encounter medications on file as of 01/27/2020.     Objective:   Goals Addressed              This Visit's Progress     Patient Stated   .  PharmD "I can't afford this medication" (pt-stated)        CARE PLAN ENTRY (see longtitudinal plan of care for additional care plan information)  Current Barriers:  . Diabetes: uncontrolled; complicated by chronic medical conditions including CAD, HTN, HLD, fatty liver, most recent A1c 8.1% o Received message form PCP that patient had not received [employer] information w/ billing information that pharmacy would need to run card to help pay for Vascepa.  . Most recent eGFR: ~85 mL/min . Current antihyperglycemic regimen: Xigduo XR 04/999 mg QAM; glipizide XL 5 mg QAM, Rybelsus 3 mg daily . Cardiovascular risk reduction: o Current hypertensive regimen: lisinopril 2.5 mg QAM, metoprolol tartrate  50 mg BID  o Current hyperlipidemia regimen: ezetimibe 10 mg daily, rosuvastatin 20 mg daily, OTC omega 3 fatty acids 1 g BID -> discussed w/ PCP that prescription EPA preferred, Vascepa 2 g BID prescribed o Current antiplatelet regimen: ASA 81, clopidogrel 75 mg daily   Pharmacist Clinical Goal(s):  Marland Kitchen Over the next 90 days, patient will work with PharmD and primary care provider to address optimized medication management  Interventions: . Comprehensive medication review performed, medication list updated in electronic  medical record . Inter-disciplinary care team collaboration (see longitudinal plan of care) . Called patient. He notes that he called [employer] and received the necessary information. He is going to take it by [facility] pharmacy to run in combination w/ his insurance to reduce the cost of Vascepa to $0. Will call me if any questions or concerns.   Patient Self Care Activities:  . Patient will check blood glucose daily, document, and provide at future appointments . Patient will take medications as prescribed . Patient will report any questions or concerns to provider   Please see past updates related to this goal by clicking on the "Past Updates" button in the selected goal          Plan:  - Scheduled f/u call in ~ 8 weeks  [name], PharmD, [place], [place] Pharmacist [place] [phone]

## 2020-01-27 NOTE — Patient Instructions (Addendum)
Try to cut glipizide 5 mg xl in 1/2 with food Colonoscopy due 02/2020 [facility]   Use bactroban to your leg and feet   Diabetes Mellitus and Nutrition, Adult When you have diabetes (diabetes mellitus), it is very important to have healthy eating habits because your blood sugar (glucose) levels are greatly affected by what you eat and drink. Eating healthy foods in the appropriate amounts, at about the same times every day, can help you:  Control your blood glucose.  Lower your risk of heart disease.  Improve your blood pressure.  Reach or maintain a healthy weight. Every person with diabetes is different, and each person has different needs for a meal plan. Your health care provider may recommend that you work with a diet and nutrition specialist (dietitian) to make a meal plan that is best for you. Your meal plan may vary depending on factors such as:  The calories you need.  The medicines you take.  Your weight.  Your blood glucose, blood pressure, and cholesterol levels.  Your activity level.  Other health conditions you have, such as heart or kidney disease. How do carbohydrates affect me? Carbohydrates, also called carbs, affect your blood glucose level more than any other type of food. Eating carbs naturally raises the amount of glucose in your blood. Carb counting is a method for keeping track of how many carbs you eat. Counting carbs is important to keep your blood glucose at a healthy level, especially if you use insulin or take certain oral diabetes medicines. It is important to know how many carbs you can safely have in each meal. This is different for every person. Your dietitian can help you calculate how many carbs you should have at each meal and for each snack. Foods that contain carbs include:  Bread, cereal, rice, pasta, and crackers.  Potatoes and corn.  Peas, beans, and lentils.  Milk and yogurt.  Fruit and juice.  Desserts, such as cakes, cookies,  ice cream, and candy. How does alcohol affect me? Alcohol can cause a sudden decrease in blood glucose (hypoglycemia), especially if you use insulin or take certain oral diabetes medicines. Hypoglycemia can be a life-threatening condition. Symptoms of hypoglycemia (sleepiness, dizziness, and confusion) are similar to symptoms of having too much alcohol. If your health care provider says that alcohol is safe for you, follow these guidelines:  Limit alcohol intake to no more than 1 drink per day for nonpregnant women and 2 drinks per day for men. One drink equals 12 oz of beer, 5 oz of wine, or 1 oz of hard liquor.  Do not drink on an empty stomach.  Keep yourself hydrated with water, diet soda, or unsweetened iced tea.  Keep in mind that regular soda, juice, and other mixers may contain a lot of sugar and must be counted as carbs. What are tips for following this plan?  Reading food labels  Start by checking the serving size on the "Nutrition Facts" label of packaged foods and drinks. The amount of calories, carbs, fats, and other nutrients listed on the label is based on one serving of the item. Many items contain more than one serving per package.  Check the total grams (g) of carbs in one serving. You can calculate the number of servings of carbs in one serving by dividing the total carbs by 15. For example, if a food has 30 g of total carbs, it would be equal to 2 servings of carbs.  Check the number  of grams (g) of saturated and trans fats in one serving. Choose foods that have low or no amount of these fats.  Check the number of milligrams (mg) of salt (sodium) in one serving. Most people should limit total sodium intake to less than 2,300 mg per day.  Always check the nutrition information of foods labeled as "low-fat" or "nonfat". These foods may be higher in added sugar or refined carbs and should be avoided.  Talk to your dietitian to identify your daily goals for nutrients listed  on the label. Shopping  Avoid buying canned, premade, or processed foods. These foods tend to be high in fat, sodium, and added sugar.  Shop around the outside edge of the grocery store. This includes fresh fruits and vegetables, bulk grains, fresh meats, and fresh dairy. Cooking  Use low-heat cooking methods, such as baking, instead of high-heat cooking methods like deep frying.  Cook using healthy oils, such as olive, canola, or sunflower oil.  Avoid cooking with butter, cream, or high-fat meats. Meal planning  Eat meals and snacks regularly, preferably at the same times every day. Avoid going long periods of time without eating.  Eat foods high in fiber, such as fresh fruits, vegetables, beans, and whole grains. Talk to your dietitian about how many servings of carbs you can eat at each meal.  Eat 4-6 ounces (oz) of lean protein each day, such as lean meat, chicken, fish, eggs, or tofu. One oz of lean protein is equal to: ? 1 oz of meat, chicken, or fish. ? 1 egg. ?  cup of tofu.  Eat some foods each day that contain healthy fats, such as avocado, nuts, seeds, and fish. Lifestyle  Check your blood glucose regularly.  Exercise regularly as told by your health care provider. This may include: ? 150 minutes of moderate-intensity or vigorous-intensity exercise each week. This could be brisk walking, biking, or water aerobics. ? Stretching and doing strength exercises, such as yoga or weightlifting, at least 2 times a week.  Take medicines as told by your health care provider.  Do not use any products that contain nicotine or tobacco, such as cigarettes and e-cigarettes. If you need help quitting, ask your health care provider.  Work with a Social worker or diabetes educator to identify strategies to manage stress and any emotional and social challenges. Questions to ask a health care provider  Do I need to meet with a diabetes educator?  Do I need to meet with a  dietitian?  What number can I call if I have questions?  When are the best times to check my blood glucose? Where to find more information:  American Diabetes Association: diabetes.org  Academy of Nutrition and Dietetics: www.eatright.[employer] of Diabetes and Digestive and Kidney Diseases (NIH): [employer] Summary  A healthy meal plan will help you control your blood glucose and maintain a healthy lifestyle.  Working with a diet and nutrition specialist (dietitian) can help you make a meal plan that is best for you.  Keep in mind that carbohydrates (carbs) and alcohol have immediate effects on your blood glucose levels. It is important to count carbs and to use alcohol carefully. This information is not intended to replace advice given to you by your health care provider. Make sure you discuss any questions you have with your health care provider. Document Revised: 06/29/2017 Document Reviewed: 08/21/2016 Elsevier Patient Education  [place].  Hyperglycemia Hyperglycemia occurs when the level of sugar (glucose) in the  blood is too high. Glucose is a type of sugar that provides the body's main source of energy. Certain hormones (insulin and glucagon) control the level of glucose in the blood. Insulin lowers blood glucose, and glucagon increases blood glucose. Hyperglycemia can result from having too little insulin in the bloodstream, or from the body not responding normally to insulin. Hyperglycemia occurs most often in people who have diabetes (diabetes mellitus), but it can happen in people who do not have diabetes. It can develop quickly, and it can be life-threatening if it causes you to become severely dehydrated (diabetic ketoacidosis or hyperglycemic hyperosmolar state). Severe hyperglycemia is a medical emergency. What are the causes? If you have diabetes, hyperglycemia may be caused by:  Diabetes medicine.  Medicines that increase blood glucose  or affect your diabetes control.  Not eating enough, or not eating often enough.  Changes in physical activity level.  Being sick or having an infection. If you have prediabetes or undiagnosed diabetes:  Hyperglycemia may be caused by those conditions. If you do not have diabetes, hyperglycemia may be caused by:  Certain medicines, including steroid medicines, beta-blockers, epinephrine, and thiazide diuretics.  Stress.  Serious illness.  Surgery.  Diseases of the pancreas.  Infection. What increases the risk? Hyperglycemia is more likely to develop in people who have risk factors for diabetes, such as:  Having a family member with diabetes.  Having a gene for type 1 diabetes that is passed from parent to child (inherited).  Living in an area with cold weather conditions.  Exposure to certain viruses.  Certain conditions in which the body's disease-fighting (immune) system attacks itself (autoimmune disorders).  Being overweight or obese.  Having an inactive (sedentary) lifestyle.  Having been diagnosed with insulin resistance.  Having a history of prediabetes, gestational diabetes, or polycystic ovarian syndrome (PCOS).  Being of American-Indian, African-American, Hispanic/Latino, or Asian/Pacific Islander descent. What are the signs or symptoms? Hyperglycemia may not cause any symptoms. If you do have symptoms, they may include early warning signs, such as:  Increased thirst.  Hunger.  Feeling very tired.  Needing to urinate more often than usual.  Blurry vision. Other symptoms may develop if hyperglycemia gets worse, such as:  Dry mouth.  Loss of appetite.  Fruity-smelling breath.  Weakness.  Unexpected or rapid weight gain or weight loss.  Tingling or numbness in the hands or feet.  Headache.  Skin that does not quickly return to normal after being lightly pinched and released (poor skin turgor).  Abdominal pain.  Cuts or bruises that  are slow to heal. How is this diagnosed? Hyperglycemia is diagnosed with a blood test to measure your blood glucose level. This blood test is usually done while you are having symptoms. Your health care provider may also do a physical exam and review your medical history. You may have more tests to determine the cause of your hyperglycemia, such as:  A fasting blood glucose (FBG) test. You will not be allowed to eat (you will fast) for at least 8 hours before a blood sample is taken.  An A1c (hemoglobin A1c) blood test. This provides information about blood glucose control over the previous 2-3 months.  An oral glucose tolerance test (OGTT). This measures your blood glucose at two times: ? After fasting. This is your baseline blood glucose level. ? Two hours after drinking a beverage that contains glucose. How is this treated? Treatment depends on the cause of your hyperglycemia. Treatment may include:  Taking medicine to  regulate your blood glucose levels. If you take insulin or other diabetes medicines, your medicine or dosage may be adjusted.  Lifestyle changes, such as exercising more, eating healthier foods, or losing weight.  Treating an illness or infection, if this caused your hyperglycemia.  Checking your blood glucose more often.  Stopping or reducing steroid medicines, if these caused your hyperglycemia. If your hyperglycemia becomes severe and it results in hyperglycemic hyperosmolar state, you must be hospitalized and given IV fluids. Follow these instructions at home:  General instructions  Take over-the-counter and prescription medicines only as told by your health care provider.  Do not use any products that contain nicotine or tobacco, such as cigarettes and e-cigarettes. If you need help quitting, ask your health care provider.  Limit alcohol intake to no more than 1 drink per day for nonpregnant women and 2 drinks per day for men. One drink equals 12 oz of beer, 5  oz of wine, or 1 oz of hard liquor.  Learn to manage stress. If you need help with this, ask your health care provider.  Keep all follow-up visits as told by your health care provider. This is important. Eating and drinking   Maintain a healthy weight.  Exercise regularly, as directed by your health care provider.  Stay hydrated, especially when you exercise, get sick, or spend time in hot temperatures.  Eat healthy foods, such as: ? Lean proteins. ? Complex carbohydrates. ? Fresh fruits and vegetables. ? Low-fat dairy products. ? Healthy fats.  Drink enough fluid to keep your urine clear or pale yellow. If you have diabetes:  Make sure you know the symptoms of hyperglycemia.  Follow your diabetes management plan, as told by your health care provider. Make sure you: ? Take your insulin and medicines as directed. ? Follow your exercise plan. ? Follow your meal plan. Eat on time, and do not skip meals. ? Check your blood glucose as often as directed. Make sure to check your blood glucose before and after exercise. If you exercise longer or in a different way than usual, check your blood glucose more often. ? Follow your sick day plan whenever you cannot eat or drink normally. Make this plan in advance with your health care provider.  Share your diabetes management plan with people in your workplace, school, and household.  Check your urine for ketones when you are ill and as told by your health care provider.  Carry a medical alert card or wear medical alert jewelry. Contact a health care provider if:  Your blood glucose is at or above 240 mg/dL (13.3 mmol/L) for 2 days in a row.  You have problems keeping your blood glucose in your target range.  You have frequent episodes of hyperglycemia. Get help right away if:  You have difficulty breathing.  You have a change in how you think, feel, or act (mental status).  You have nausea or vomiting that does not go  away. These symptoms may represent a serious problem that is an emergency. Do not wait to see if the symptoms will go away. Get medical help right away. Call your local emergency services (911 in the [place] Do not drive yourself to the hospital. Summary  Hyperglycemia occurs when the level of sugar (glucose) in the blood is too high.  Hyperglycemia is diagnosed with a blood test to measure your blood glucose level. This blood test is usually done while you are having symptoms. Your health care provider may also do a  physical exam and review your medical history.  If you have diabetes, follow your diabetes management plan as told by your health care provider.  Contact your health care provider if you have problems keeping your blood glucose in your target range. This information is not intended to replace advice given to you by your health care provider. Make sure you discuss any questions you have with your health care provider. Document Revised: 04/03/2016 Document Reviewed: 04/03/2016 Elsevier Patient Education  [place].

## 2020-01-28 ENCOUNTER — Telehealth: Payer: Self-pay | Admitting: *Deleted

## 2020-01-28 NOTE — Telephone Encounter (Signed)
Please place future orders for lab appt.  

## 2020-01-29 ENCOUNTER — Ambulatory Visit: Payer: Medicare Other | Admitting: Pharmacist

## 2020-01-29 DIAGNOSIS — E1165 Type 2 diabetes mellitus with hyperglycemia: Secondary | ICD-10-CM

## 2020-01-29 DIAGNOSIS — E1159 Type 2 diabetes mellitus with other circulatory complications: Secondary | ICD-10-CM | POA: Insufficient documentation

## 2020-01-29 DIAGNOSIS — I152 Hypertension secondary to endocrine disorders: Secondary | ICD-10-CM | POA: Insufficient documentation

## 2020-01-29 MED ORDER — METFORMIN HCL ER 500 MG PO TB24: 1000.0000 mg | ORAL_TABLET | Freq: Two times a day (BID) | ORAL | 1 refills | Status: DC

## 2020-01-29 MED ORDER — EMPAGLIFLOZIN 25 MG PO TABS: 25.0000 mg | ORAL_TABLET | Freq: Every day | ORAL | 1 refills | Status: DC

## 2020-01-29 NOTE — Telephone Encounter (Signed)
No orders are being shown to be released for tomorrows labs. Can you order them please?

## 2020-01-29 NOTE — Patient Instructions (Signed)
Visit Information  Goals Addressed              This Visit's Progress     Patient Stated     PharmD "I can't afford this medication" (pt-stated)        CARE PLAN ENTRY (see longtitudinal plan of care for additional care plan information)  Current Barriers:   Diabetes: uncontrolled; complicated by chronic medical conditions including CAD, HTN, HLD, fatty liver, most recent A1c 8.1% o Received call from patient noting that Merleen Nicely cost is $500/30 days.  o Denies any GI upset since starting Rybelsus  Most recent eGFR: ~85 mL/min  Current antihyperglycemic regimen: Xigduo XR 04/999 mg QAM; Rybelsus 3 mg daily  Cardiovascular risk reduction: o Current hypertensive regimen: lisinopril 2.5 mg QAM, metoprolol tartrate 50 mg BID  o Current hyperlipidemia regimen: ezetimibe 10 mg daily, rosuvastatin 20 mg daily, Vascepa 2 g BID - APPROVED for [employer], copay for HLD meds $0 o Current antiplatelet regimen: ASA 81, clopidogrel 75 mg daily   Pharmacist Clinical Goal(s):   Over the next 90 days, patient will work with PharmD and primary care provider to address optimized medication management  Interventions:  Comprehensive medication review performed, medication list updated in electronic medical record  Inter-disciplinary care team collaboration (see longitudinal plan of care)  Reviewed [employer] patient assistance program eligibility for Xigduo. Patient is over income. He may qualify for [employer] process, particularly if Vania Rea is not covered. Will have patient complete supply of Xigduo, then switch to separate metformin XR 1000 mg BID. Will pursue patient assistance program coverage for Jardiance 25 mg daily. Likely that we will get notification that Vania Rea is not covered and PA is required. Once PA completed, will need proof of copay, and will see if that will be enough to qualify for BI appeal process.   Patient Self Care  Activities:   Patient will check blood glucose daily, document, and provide at future appointments  Patient will take medications as prescribed  Patient will report any questions or concerns to provider   Please see past updates related to this goal by clicking on the "Past Updates" button in the selected goal         The patient verbalized understanding of instructions provided today and declined a print copy of patient instruction materials.    Plan:  - Will continue to collaborate w/ patient and provider as above  [name], PharmD, [place], [place] Pharmacist [place] [phone]

## 2020-01-29 NOTE — Chronic Care Management (AMB) (Signed)
Chronic Care Management   Follow Up Note   01/29/2020 Name: Kevin Ryan MRN: [id number] DOB: [birth year]  Referred by: McLean-Scocuzza, Nino Glow, MD Reason for referral : Chronic Care Management (Medication Management)   Kevin Ryan is a 69 y.o. year old male who is a primary care patient of McLean-Scocuzza, Nino Glow, MD. The CCM team was consulted for assistance with chronic disease management and care coordination needs.    Received call from patient today with medication access needs.   Review of patient status, including review of consultants reports, relevant laboratory and other test results, and collaboration with appropriate care team members and the patient's provider was performed as part of comprehensive patient evaluation and provision of chronic care management services.    SDOH (Social Determinants of Health) assessments performed: Yes See Care Plan activities for detailed interventions related to Kevin Ryan)     Outpatient Encounter Medications as of 01/29/2020  Medication Sig   Semaglutide (RYBELSUS) 3 MG TABS Take 3 mg by mouth daily. Take on an empty stomach and wait 30 minutes before meal   aspirin 81 MG tablet Take 81 mg by mouth daily.   Cholecalciferol (VITAMIN D3) 2000 units TABS Take by mouth.   clopidogrel (PLAVIX) 75 MG tablet Take 75 mg by mouth daily.   Dapagliflozin-metFORMIN HCl ER (XIGDUO XR) 04-999 MG TB24 Take 1 tablet by mouth in the morning. Stop jardiance and metformin xr 1000 mg qam and 500 mg qpm   ezetimibe (ZETIA) 10 MG tablet Take 1 tablet (10 mg total) by mouth daily.   icosapent Ethyl (VASCEPA) 1 g capsule Take 2 capsules (2 g total) by mouth 2 (two) times daily.   lisinopril (ZESTRIL) 2.5 MG tablet Take 1 tablet (2.5 mg total) by mouth daily.   MAGNESIUM PO Take by mouth daily.   meclizine (ANTIVERT) 25 MG tablet Take 25 mg by mouth 3 (three) times daily as needed for dizziness.   meloxicam (MOBIC) 15 MG tablet Take 1 tablet (15 mg  total) by mouth daily.   metoprolol tartrate (LOPRESSOR) 50 MG tablet Take 1 tablet (50 mg total) by mouth 2 (two) times daily.   ONETOUCH ULTRA test strip 1 each 3 (three) times daily.   Probiotic Product (PROBIOTIC DAILY PO) Take by mouth.   rosuvastatin (CRESTOR) 40 MG tablet Take 1 tablet (40 mg total) by mouth at bedtime.   Testosterone 20.25 MG/ACT (1.62%) GEL Apply 1 pump to each shoulder daily   vitamin B-12 (CYANOCOBALAMIN) 500 MCG tablet Take 5,000 mcg by mouth daily.    No facility-administered encounter medications on file as of 01/29/2020.     Objective:   Goals Addressed              This Visit's Progress     Patient Stated     PharmD "I can't afford this medication" (pt-stated)        CARE PLAN ENTRY (see longtitudinal plan of care for additional care plan information)  Current Barriers:   Diabetes: uncontrolled; complicated by chronic medical conditions including CAD, HTN, HLD, fatty liver, most recent A1c 8.1% o Received call from patient noting that Merleen Nicely cost is $500/30 days.  o Denies any GI upset since starting Rybelsus  Most recent eGFR: ~85 mL/min  Current antihyperglycemic regimen: Xigduo XR 04/999 mg QAM; Rybelsus 3 mg daily  Cardiovascular risk reduction: o Current hypertensive regimen: lisinopril 2.5 mg QAM, metoprolol tartrate 50 mg BID  o Current hyperlipidemia regimen: ezetimibe 10 mg daily, rosuvastatin 20  mg daily, Vascepa 2 g BID - APPROVED for [employer], copay for HLD meds $0 o Current antiplatelet regimen: ASA 81, clopidogrel 75 mg daily   Pharmacist Clinical Goal(s):   Over the next 90 days, patient will work with PharmD and primary care provider to address optimized medication management  Interventions:  Comprehensive medication review performed, medication list updated in electronic medical record  Inter-disciplinary care team collaboration (see longitudinal plan of care)  Reviewed [place]  patient assistance program eligibility for Xigduo. Patient is over income. He may qualify for [employer] process, particularly if Vania Rea is not covered. Will have patient complete supply of Xigduo, then switch to separate metformin XR 1000 mg BID. Will pursue patient assistance program coverage for Jardiance 25 mg daily. Likely that we will get notification that Vania Rea is not covered and PA is required. Once PA completed, will need proof of copay, and will see if that will be enough to qualify for BI appeal process.   Patient Self Care Activities:   Patient will check blood glucose daily, document, and provide at future appointments  Patient will take medications as prescribed  Patient will report any questions or concerns to provider   Please see past updates related to this goal by clicking on the "Past Updates" button in the selected goal          Plan:  - Will continue to collaborate w/ patient and provider as above  [name], PharmD, [place], [place] Pharmacist [place] [phone]

## 2020-01-30 ENCOUNTER — Other Ambulatory Visit: Payer: Self-pay

## 2020-01-30 ENCOUNTER — Other Ambulatory Visit (INDEPENDENT_AMBULATORY_CARE_PROVIDER_SITE_OTHER): Payer: Medicare Other

## 2020-01-30 DIAGNOSIS — I152 Hypertension secondary to endocrine disorders: Secondary | ICD-10-CM

## 2020-01-30 DIAGNOSIS — E1159 Type 2 diabetes mellitus with other circulatory complications: Secondary | ICD-10-CM

## 2020-01-30 DIAGNOSIS — I10 Essential (primary) hypertension: Secondary | ICD-10-CM | POA: Diagnosis not present

## 2020-01-30 LAB — COMPREHENSIVE METABOLIC PANEL
ALT: 30 U/L (ref 0–53)
AST: 30 U/L (ref 0–37)
Albumin: 4.6 g/dL (ref 3.5–5.2)
Alkaline Phosphatase: 84 U/L (ref 39–117)
BUN: 21 mg/dL (ref 6–23)
CO2: 28 mEq/L (ref 19–32)
Calcium: 9.6 mg/dL (ref 8.4–10.5)
Chloride: 100 mEq/L (ref 96–112)
Creatinine, Ser: 0.96 mg/dL (ref 0.40–1.50)
GFR: 77.55 mL/min (ref >60.00)
Glucose, Bld: 179 mg/dL — ABNORMAL HIGH (ref 70–99)
Potassium: 4.3 mEq/L (ref 3.5–5.1)
Sodium: 137 mEq/L (ref 135–145)
Total Bilirubin: 0.5 mg/dL (ref 0.2–1.2)
Total Protein: 7.5 g/dL (ref 6.0–8.3)

## 2020-01-30 LAB — CBC WITH DIFFERENTIAL/PLATELET
Basophils Absolute: 0.1 10*3/uL (ref 0.0–0.1)
Basophils Relative: 1.1 % (ref 0.0–3.0)
Eosinophils Absolute: 0.3 10*3/uL (ref 0.0–0.7)
Eosinophils Relative: 3.3 % (ref 0.0–5.0)
HCT: 47.1 % (ref 39.0–52.0)
Hemoglobin: 15.9 g/dL (ref 13.0–17.0)
Lymphocytes Relative: 25.5 % (ref 12.0–46.0)
Lymphs Abs: 2.2 10*3/uL (ref 0.7–4.0)
MCHC: 33.8 g/dL (ref 30.0–36.0)
MCV: 98.4 fl (ref 78.0–100.0)
Monocytes Absolute: 0.6 10*3/uL (ref 0.1–1.0)
Monocytes Relative: 6.5 % (ref 3.0–12.0)
Neutro Abs: 5.4 10*3/uL (ref 1.4–7.7)
Neutrophils Relative %: 63.6 % (ref 43.0–77.0)
Platelets: 143 10*3/uL — ABNORMAL LOW (ref 150.0–400.0)
RBC: 4.78 Mil/uL (ref 4.22–5.81)
RDW: 14.4 % (ref 11.5–15.5)
WBC: 8.6 10*3/uL (ref 4.0–10.5)

## 2020-01-30 LAB — LIPID PANEL
Cholesterol: 122 mg/dL (ref 0–200)
HDL: 31.4 mg/dL — ABNORMAL LOW (ref >39.00)
NonHDL: 90.88
Total CHOL/HDL Ratio: 4
Triglycerides: 392 mg/dL — ABNORMAL HIGH (ref 0.0–149.0)
VLDL: 78.4 mg/dL — ABNORMAL HIGH (ref 0.0–40.0)

## 2020-01-30 LAB — LDL CHOLESTEROL, DIRECT: Direct LDL: 52 mg/dL

## 2020-01-30 LAB — HEMOGLOBIN A1C: Hgb A1c MFr Bld: 8.2 % — ABNORMAL HIGH (ref 4.6–6.5)

## 2020-02-05 ENCOUNTER — Ambulatory Visit (INDEPENDENT_AMBULATORY_CARE_PROVIDER_SITE_OTHER): Payer: Medicare Other | Admitting: Pharmacist

## 2020-02-05 DIAGNOSIS — E1165 Type 2 diabetes mellitus with hyperglycemia: Secondary | ICD-10-CM

## 2020-02-05 DIAGNOSIS — E785 Hyperlipidemia, unspecified: Secondary | ICD-10-CM

## 2020-02-05 DIAGNOSIS — I2510 Atherosclerotic heart disease of native coronary artery without angina pectoris: Secondary | ICD-10-CM

## 2020-02-05 NOTE — Chronic Care Management (AMB) (Signed)
Chronic Care Management   Follow Up Note   02/05/2020 Name: Kevin Ryan MRN: [id number] DOB: 1951/07/13  Referred by: McLean-Scocuzza, Nino Glow, MD Reason for referral : Chronic Care Management (Medication Management)   Kevin Ryan is a 69 y.o. year old male who is a primary care patient of McLean-Scocuzza, Nino Glow, MD. The CCM team was consulted for assistance with chronic disease management and care coordination needs.    Care coordination completed today.   Review of patient status, including review of consultants reports, relevant laboratory and other test results, and collaboration with appropriate care team members and the patient's provider was performed as part of comprehensive patient evaluation and provision of chronic care management services.    SDOH (Social Determinants of Health) assessments performed: Yes See Care Plan activities for detailed interventions related to SDOH)  SDOH Interventions     Most Recent Value  SDOH Interventions  Financial Strain Interventions Other (Comment)  [patient assistance evaluation]       Outpatient Encounter Medications as of 02/05/2020  Medication Sig  . aspirin 81 MG tablet Take 81 mg by mouth daily.  . Cholecalciferol (VITAMIN D3) 2000 units TABS Take by mouth.  . clopidogrel (PLAVIX) 75 MG tablet Take 75 mg by mouth daily.  . empagliflozin (JARDIANCE) 25 MG TABS tablet Take 1 tablet (25 mg total) by mouth daily before breakfast.  . ezetimibe (ZETIA) 10 MG tablet Take 1 tablet (10 mg total) by mouth daily.  Marland Kitchen icosapent Ethyl (VASCEPA) 1 g capsule Take 2 capsules (2 g total) by mouth 2 (two) times daily.  Marland Kitchen lisinopril (ZESTRIL) 2.5 MG tablet Take 1 tablet (2.5 mg total) by mouth daily.  Marland Kitchen MAGNESIUM PO Take by mouth daily.  . meclizine (ANTIVERT) 25 MG tablet Take 25 mg by mouth 3 (three) times daily as needed for dizziness.  . meloxicam (MOBIC) 15 MG tablet Take 1 tablet (15 mg total) by mouth daily.  . metFORMIN (GLUCOPHAGE XR)  500 MG 24 hr tablet Take 2 tablets (1,000 mg total) by mouth in the morning and at bedtime.  . metoprolol tartrate (LOPRESSOR) 50 MG tablet Take 1 tablet (50 mg total) by mouth 2 (two) times daily.  Glory Rosebush ULTRA test strip 1 each 3 (three) times daily.  . Probiotic Product (PROBIOTIC DAILY PO) Take by mouth.  . rosuvastatin (CRESTOR) 40 MG tablet Take 1 tablet (40 mg total) by mouth at bedtime.  . Semaglutide (RYBELSUS) 3 MG TABS Take 3 mg by mouth daily. Take on an empty stomach and wait 30 minutes before meal  . Testosterone 20.25 MG/ACT (1.62%) GEL Apply 1 pump to each shoulder daily  . vitamin B-12 (CYANOCOBALAMIN) 500 MCG tablet Take 5,000 mcg by mouth daily.    No facility-administered encounter medications on file as of 02/05/2020.     Objective:   Goals Addressed              This Visit's Progress     Patient Stated   .  PharmD "I can't afford this medication" (pt-stated)        CARE PLAN ENTRY (see longtitudinal plan of care for additional care plan information)  Current Barriers:  . Diabetes: uncontrolled; complicated by chronic medical conditions including CAD, HTN, HLD, fatty liver, most recent A1c 8.2% o Pursuing SGLT2 patient assistance o Received fax from [place] [facility] that PA was needed for Vascepa . Most recent eGFR: ~85 mL/min . Current antihyperglycemic regimen: Xigduo XR 04/999 mg QAM (plan to  switch to metformin XR 1000 mg BID + Jardiance 25 mg daily, likely through patient assistance); Rybelsus 3 mg daily (plan to increase to 7 mg daily once received from [employer] patient assistance program) . Cardiovascular risk reduction: o Current hypertensive regimen: lisinopril 2.5 mg QAM, metoprolol tartrate 50 mg BID  o Current hyperlipidemia regimen: ezetimibe 10 mg daily, rosuvastatin 20 mg daily, Vascepa 2 g BID - APPROVED for [employer], copay for HLD meds will be $0 o Current antiplatelet regimen: ASA 81, clopidogrel 75 mg daily    Pharmacist Clinical Goal(s):  Marland Kitchen Over the next 90 days, patient will work with PharmD and primary care provider to address optimized medication management  Interventions: . Comprehensive medication review performed, medication list updated in electronic medical record . Inter-disciplinary care team collaboration (see longitudinal plan of care) . Contacted [facility]. They did not think a PA is required for generic icosapent ethyl, but completed the PA on Cover My Meds. Will f/u regarding plan decision. [facility] notes that copay is ~$130/30 day, and this is before patient applies the [facility] billing information to reduce the medication copay to $0.  . Per [facility], PA was not needed for Jardiance, but cost was also ~$500/90 day. This should be sufficient to be approved for Jardiance assistance through [facility]. The pharmacy is going to fax me proof of this copay. Will collaborate w/ [name] to mail patient portion of Jardiance patient assistance application to patient, and I will collaborate w/ PCP for signature. Once all parts received, will submit to [facility].  . [employer] to f/u on status of shipping of Rybelsus 7 mg/14 mg dose. Noted that the medication should arrive within the next 1-1.5 weeks. . Contacted patient to discuss above. He noted that he did provide the [employer] billing information to [facility]. Will await decision of insurance PA, and communicate to pharmacy to use insurance (after PA approved) as well as [employer]Well Funding information to reduce copay. He verbalizes understanding.   Patient Self Care Activities:  . Patient will check blood glucose daily, document, and provide at future appointments . Patient will take medications as prescribed . Patient will report any questions or concerns to provider   Please see past updates related to this goal by clicking on the "Past Updates" button in the selected goal          Plan:   - Will continue to follow as above. Outreach as previously scheduled  [name], PharmD, [place], [place] Pharmacist [employer] [phone]

## 2020-02-05 NOTE — Patient Instructions (Addendum)
Visit Information  Goals Addressed              This Visit's Progress     Patient Stated   .  PharmD "I can't afford this medication" (pt-stated)        CARE PLAN ENTRY (see longtitudinal plan of care for additional care plan information)  Current Barriers:  . Diabetes: uncontrolled; complicated by chronic medical conditions including CAD, HTN, HLD, fatty liver, most recent A1c 8.2% o Pursuing SGLT2 patient assistance o Received fax from [place] [facility] that PA was needed for Vascepa . Most recent eGFR: ~85 mL/min . Current antihyperglycemic regimen: Xigduo XR 04/999 mg QAM (plan to switch to metformin XR 1000 mg BID + Jardiance 25 mg daily, likely through patient assistance); Rybelsus 3 mg daily (plan to increase to 7 mg daily once received from [employer] patient assistance program) . Cardiovascular risk reduction: o Current hypertensive regimen: lisinopril 2.5 mg QAM, metoprolol tartrate 50 mg BID  o Current hyperlipidemia regimen: ezetimibe 10 mg daily, rosuvastatin 20 mg daily, Vascepa 2 g BID - APPROVED for [employer], copay for HLD meds will be $0 o Current antiplatelet regimen: ASA 81, clopidogrel 75 mg daily   Pharmacist Clinical Goal(s):  Marland Kitchen Over the next 90 days, patient will work with PharmD and primary care provider to address optimized medication management  Interventions: . Comprehensive medication review performed, medication list updated in electronic medical record . Inter-disciplinary care team collaboration (see longitudinal plan of care) . Contacted [facility]. They did not think a PA is required for generic icosapent ethyl, but completed the PA on Cover My Meds. Will f/u regarding plan decision. [facility] notes that copay is ~$130/30 day, and this is before patient applies the [facility] billing information to reduce the medication copay to $0.  . Per [facility], PA was not needed for Jardiance, but cost was also ~$500/90  day. This should be sufficient to be approved for Jardiance assistance through [facility]. The pharmacy is going to fax me proof of this copay. Will collaborate w/ [name] to mail patient portion of Jardiance patient assistance application to patient, and I will collaborate w/ PCP for signature. Once all parts received, will submit to [facility].  . [employer] to f/u on status of shipping of Rybelsus 7 mg/14 mg dose. Noted that the medication should arrive within the next 1-1.5 weeks. . Contacted patient to discuss above. He noted that he did provide the [employer] billing information to [facility]. Will await decision of insurance PA, and communicate to pharmacy to use insurance (after PA approved) as well as HealthWell Funding information to reduce copay. He verbalizes understanding.   Patient Self Care Activities:  . Patient will check blood glucose daily, document, and provide at future appointments . Patient will take medications as prescribed . Patient will report any questions or concerns to provider   Please see past updates related to this goal by clicking on the "Past Updates" button in the selected goal         The patient verbalized understanding of instructions provided today and declined a print copy of patient instruction materials.  Plan:  - Will continue to follow as above. Outreach as previously scheduled  [name], PharmD, [place], [place] Pharmacist [employer] [phone]

## 2020-02-06 ENCOUNTER — Ambulatory Visit: Payer: Self-pay | Admitting: Pharmacist

## 2020-02-06 ENCOUNTER — Other Ambulatory Visit: Payer: Self-pay | Admitting: Pharmacy Technician

## 2020-02-06 DIAGNOSIS — I2510 Atherosclerotic heart disease of native coronary artery without angina pectoris: Secondary | ICD-10-CM | POA: Diagnosis not present

## 2020-02-06 DIAGNOSIS — E785 Hyperlipidemia, unspecified: Secondary | ICD-10-CM

## 2020-02-06 DIAGNOSIS — E1165 Type 2 diabetes mellitus with hyperglycemia: Secondary | ICD-10-CM

## 2020-02-06 NOTE — Patient Outreach (Signed)
[facility]) Care Management  02/06/2020  Kevin Ryan 1950-09-06 [id number]                                       Medication Assistance Referral  Referral From: [facility] Embedded RPh [name]   Medication/Company: [name] Patient application portion:  Mailed Provider application portion:  N/A Embedded pharmacist to have signed while in clinic to Dr. [name] Provider address/fax verified via: Office website     Follow up:  Will follow up with patient in 5-15 business days to confirm application(s) have been received.  [name] P. Mimi Debellis, [place]  [phone]

## 2020-02-06 NOTE — Patient Instructions (Signed)
Visit Information  Goals Addressed              This Visit's Progress     Patient Stated   .  PharmD "I can't afford this medication" (pt-stated)        CARE PLAN ENTRY (see longtitudinal plan of care for additional care plan information)  Current Barriers:  . Diabetes: uncontrolled; complicated by chronic medical conditions including CAD, HTN, HLD, fatty liver, most recent A1c 8.2% o Notified by CoverMyMeds that icosapent ethyl PA was denied. Brand Vascepa is preferred . Most recent eGFR: ~85 mL/min . Current antihyperglycemic regimen: Xigduo XR 04/999 mg QAM (plan to switch to metformin XR 1000 mg BID + Jardiance 25 mg daily, likely through patient assistance); Rybelsus 3 mg daily (plan to increase to 7 mg daily once received from [employer] patient assistance program) . Cardiovascular risk reduction: o Current hypertensive regimen: lisinopril 2.5 mg QAM, metoprolol tartrate 50 mg BID  o Current hyperlipidemia regimen: ezetimibe 10 mg daily, rosuvastatin 20 mg daily, Vascepa 2 g BID - APPROVED for [employer], copay for HLD meds will be $0 o Current antiplatelet regimen: ASA 81, clopidogrel 75 mg daily   Pharmacist Clinical Goal(s):  Marland Kitchen Over the next 90 days, patient will work with PharmD and primary care provider to address optimized medication management  Interventions: . Comprehensive medication review performed, medication list updated in electronic medical record . Inter-disciplinary care team collaboration (see longitudinal plan of care) . Completed PA for Vascepa, was notified that PA is not required. Contacted patient to confirm HealthWell information: BIN [id number] PCN [id number] ID [id number] RXGRP [id number]. Called information to pharmacy. Vascepa is now $0/90 day. They will get this prescription ready for the patient. Patient informed.   Patient Self Care Activities:  . Patient will check blood glucose daily, document, and provide at future  appointments . Patient will take medications as prescribed . Patient will report any questions or concerns to provider   Please see past updates related to this goal by clicking on the "Past Updates" button in the selected goal         The patient verbalized understanding of instructions provided today and declined a print copy of patient instruction materials.   Plan:  - Scheduled f/u call in ~ 4 weeks  [name], PharmD, [place], [place] Pharmacist [place] [phone]

## 2020-02-06 NOTE — Chronic Care Management (AMB) (Signed)
Chronic Care Management   Follow Up Note   02/06/2020 Name: JVON MERONEY MRN: [id number] DOB: 10-05-1950  Referred by: McLean-Scocuzza, Nino Glow, MD Reason for referral : Chronic Care Management (Medication Management)   MACK ALVIDREZ is a 69 y.o. year old male who is a primary care patient of McLean-Scocuzza, Nino Glow, MD. The CCM team was consulted for assistance with chronic disease management and care coordination needs.    Contacted patient for medication management f/u.  Review of patient status, including review of consultants reports, relevant laboratory and other test results, and collaboration with appropriate care team members and the patient's provider was performed as part of comprehensive patient evaluation and provision of chronic care management services.    SDOH (Social Determinants of Health) assessments performed: Yes See Care Plan activities for detailed interventions related to [facility])     Outpatient Encounter Medications as of 02/06/2020  Medication Sig  . aspirin 81 MG tablet Take 81 mg by mouth daily.  . Cholecalciferol (VITAMIN D3) 2000 units TABS Take by mouth.  . clopidogrel (PLAVIX) 75 MG tablet Take 75 mg by mouth daily.  . empagliflozin (JARDIANCE) 25 MG TABS tablet Take 1 tablet (25 mg total) by mouth daily before breakfast.  . ezetimibe (ZETIA) 10 MG tablet Take 1 tablet (10 mg total) by mouth daily.  Marland Kitchen icosapent Ethyl (VASCEPA) 1 g capsule Take 2 capsules (2 g total) by mouth 2 (two) times daily.  Marland Kitchen lisinopril (ZESTRIL) 2.5 MG tablet Take 1 tablet (2.5 mg total) by mouth daily.  Marland Kitchen MAGNESIUM PO Take by mouth daily.  . meclizine (ANTIVERT) 25 MG tablet Take 25 mg by mouth 3 (three) times daily as needed for dizziness.  . meloxicam (MOBIC) 15 MG tablet Take 1 tablet (15 mg total) by mouth daily.  . metFORMIN (GLUCOPHAGE XR) 500 MG 24 hr tablet Take 2 tablets (1,000 mg total) by mouth in the morning and at bedtime.  . metoprolol tartrate (LOPRESSOR) 50 MG  tablet Take 1 tablet (50 mg total) by mouth 2 (two) times daily.  Glory Rosebush ULTRA test strip 1 each 3 (three) times daily.  . Probiotic Product (PROBIOTIC DAILY PO) Take by mouth.  . rosuvastatin (CRESTOR) 40 MG tablet Take 1 tablet (40 mg total) by mouth at bedtime.  . Semaglutide (RYBELSUS) 3 MG TABS Take 3 mg by mouth daily. Take on an empty stomach and wait 30 minutes before meal  . Testosterone 20.25 MG/ACT (1.62%) GEL Apply 1 pump to each shoulder daily  . vitamin B-12 (CYANOCOBALAMIN) 500 MCG tablet Take 5,000 mcg by mouth daily.    No facility-administered encounter medications on file as of 02/06/2020.     Objective:   Goals Addressed              This Visit's Progress     Patient Stated   .  PharmD "I can't afford this medication" (pt-stated)        CARE PLAN ENTRY (see longtitudinal plan of care for additional care plan information)  Current Barriers:  . Diabetes: uncontrolled; complicated by chronic medical conditions including CAD, HTN, HLD, fatty liver, most recent A1c 8.2% o Notified by CoverMyMeds that icosapent ethyl PA was denied. Brand Vascepa is preferred . Most recent eGFR: ~85 mL/min . Current antihyperglycemic regimen: Xigduo XR 04/999 mg QAM (plan to switch to metformin XR 1000 mg BID + Jardiance 25 mg daily, likely through patient assistance); Rybelsus 3 mg daily (plan to increase to 7 mg daily once  received from [employer] patient assistance program) . Cardiovascular risk reduction: o Current hypertensive regimen: lisinopril 2.5 mg QAM, metoprolol tartrate 50 mg BID  o Current hyperlipidemia regimen: ezetimibe 10 mg daily, rosuvastatin 20 mg daily, Vascepa 2 g BID - APPROVED for [employer], copay for HLD meds will be $0 o Current antiplatelet regimen: ASA 81, clopidogrel 75 mg daily   Pharmacist Clinical Goal(s):  Marland Kitchen Over the next 90 days, patient will work with PharmD and primary care provider to address optimized medication  management  Interventions: . Comprehensive medication review performed, medication list updated in electronic medical record . Inter-disciplinary care team collaboration (see longitudinal plan of care) . Completed PA for Vascepa, was notified that PA is not required. Contacted patient to confirm HealthWell information: BIN [id number] PCN [id number] ID [id number] RXGRP [id number]. Called information to pharmacy. Vascepa is now $0/90 day. They will get this prescription ready for the patient. Patient informed.   Patient Self Care Activities:  . Patient will check blood glucose daily, document, and provide at future appointments . Patient will take medications as prescribed . Patient will report any questions or concerns to provider   Please see past updates related to this goal by clicking on the "Past Updates" button in the selected goal          Plan:  - Scheduled f/u call in ~ 4 weeks  [name], PharmD, [place], [place] Pharmacist [place] [phone]

## 2020-02-09 ENCOUNTER — Telehealth: Payer: Self-pay | Admitting: Internal Medicine

## 2020-02-09 NOTE — Telephone Encounter (Signed)
-----   Message from Delorise Jackson, MD sent at 02/09/2020 10:49 AM EDT ----- Pt  has Rybelsus 7 mg pt asst coming into the office please call him when received   Inform patient rybelsus higher dose 7 mg daily coming to office be on the lookout please  Thanks Elizabeth

## 2020-02-09 NOTE — Telephone Encounter (Signed)
Can you help?  I think this was approved

## 2020-02-09 NOTE — Telephone Encounter (Signed)
Prior authorization has been submitted for patient's Vascepa.  Awaiting approval or denial.    Denied. This drug is not on our formulary. Your doctor gave [place] a statement (and medical records) supporting your request. This statement and our records did not show that you have tried at least two other drugs on the formulary used to treat your condition. Other covered drug(s) is/are: fenofibrate micronized oral capsule (67mg , 200mg ), fenofibrate oral capsule (134mg ), fenofibrate oral tablet (48mg , 54mg , 145mg , 160mg ), niacin ER (antihyperlipidemic) oral tablet (500mg , 750mg , 1000mg ), Vascepa oral capsule (0.5gm, 1gm). Vascepa (brand product) has the same active ingredient as the generic drug.

## 2020-02-12 ENCOUNTER — Other Ambulatory Visit: Payer: Self-pay | Admitting: Pharmacy Technician

## 2020-02-12 NOTE — Patient Outreach (Signed)
[facility]) Care Management  02/12/2020  HARRY SHUCK 1951-06-04 [id number]  Care coordination call placed to [place] in regards to delivery status of patient's Rybelsus.  Spoke to [place] who says an order for 30 Rybelsus 7mg  and 30 Rybelsus 14mg  was released to the processing/shipping team on 01/26/2020. She informed it normally takes 10-14 business days from that date which would make it 02/16/2020. She informs if patient has not received the medication by the 19th and is running low on medication, then we can call back in and get him a delayed delivery voucher to be used at his local pharmacy.  Will follow up with Novo Nordisk in 3-5 business days to inquire about delivery status.  Jamari Moten P. Ilya Neely, [place]  [phone]

## 2020-02-16 ENCOUNTER — Ambulatory Visit: Payer: Self-pay | Admitting: Pharmacist

## 2020-02-16 ENCOUNTER — Other Ambulatory Visit: Payer: Self-pay | Admitting: Pharmacy Technician

## 2020-02-16 DIAGNOSIS — E785 Hyperlipidemia, unspecified: Secondary | ICD-10-CM

## 2020-02-16 DIAGNOSIS — E1165 Type 2 diabetes mellitus with hyperglycemia: Secondary | ICD-10-CM

## 2020-02-16 LAB — HM DIABETES EYE EXAM

## 2020-02-16 MED ORDER — RYBELSUS 14 MG PO TABS: 14.0000 mg | ORAL_TABLET | Freq: Every day | ORAL | 0 refills | Status: DC

## 2020-02-16 MED ORDER — RYBELSUS 7 MG PO TABS: 7.0000 mg | ORAL_TABLET | Freq: Every day | ORAL | 0 refills | Status: DC

## 2020-02-16 NOTE — Telephone Encounter (Signed)
Noted  Dr. TMS 

## 2020-02-16 NOTE — Patient Outreach (Signed)
[place] [facility]) Care Management  02/16/2020  Kevin Ryan 1950/09/01 [id number]  ADDENDUM  Care coordination call placed to [facility] in regards to patient's Rybelsus application with [employer].  Due to shipping delays, [employer] provided a Nurse, children's for patient to use at this local pharmacy to obtain  A 30 days supply of Rybelsus 7mg  and a 30 days supply of Rybelsus 14mg  for free.   Spoke to Amy and provided her the billing information. Amy informed scripts were needed. Informed Amy that the scripts were sent over electronically by embedded [facility] RPh [name]. Amy informed it normally takes a few minutes for scripts to appear in the que but would   to take my name and number and would notify me if she does not receive the scripts.  Will outreach patient that his scripts were sent to his local pharmacy.  Bran Aldridge P. Swathi Dauphin, [place]  [phone]

## 2020-02-16 NOTE — Chronic Care Management (AMB) (Signed)
Chronic Care Management   Follow Up Note   02/16/2020 Name: Kevin Ryan MRN: [id number] DOB: 09/14/1950  Referred by: McLean-Scocuzza, Nino Glow, MD Reason for referral : Chronic Care Management (Medication Management)   Kevin Ryan is a 69 y.o. year old male who is a primary care patient of McLean-Scocuzza, Nino Glow, MD. The CCM team was consulted for assistance with chronic disease management and care coordination needs.    Contacted patient for medication management updates.   Review of patient status, including review of consultants reports, relevant laboratory and other test results, and collaboration with appropriate care team members and the patient's provider was performed as part of comprehensive patient evaluation and provision of chronic care management services.    SDOH (Social Determinants of Health) assessments performed: Yes See Care Plan activities for detailed interventions related to [facility])     Outpatient Encounter Medications as of 02/16/2020  Medication Sig  . aspirin 81 MG tablet Take 81 mg by mouth daily.  . Cholecalciferol (VITAMIN D3) 2000 units TABS Take by mouth.  . clopidogrel (PLAVIX) 75 MG tablet Take 75 mg by mouth daily.  . empagliflozin (JARDIANCE) 25 MG TABS tablet Take 1 tablet (25 mg total) by mouth daily before breakfast.  . ezetimibe (ZETIA) 10 MG tablet Take 1 tablet (10 mg total) by mouth daily.  Marland Kitchen icosapent Ethyl (VASCEPA) 1 g capsule Take 2 capsules (2 g total) by mouth 2 (two) times daily.  Marland Kitchen lisinopril (ZESTRIL) 2.5 MG tablet Take 1 tablet (2.5 mg total) by mouth daily.  Marland Kitchen MAGNESIUM PO Take by mouth daily.  . meclizine (ANTIVERT) 25 MG tablet Take 25 mg by mouth 3 (three) times daily as needed for dizziness.  . meloxicam (MOBIC) 15 MG tablet Take 1 tablet (15 mg total) by mouth daily.  . metFORMIN (GLUCOPHAGE XR) 500 MG 24 hr tablet Take 2 tablets (1,000 mg total) by mouth in the morning and at bedtime.  . metoprolol tartrate (LOPRESSOR) 50  MG tablet Take 1 tablet (50 mg total) by mouth 2 (two) times daily.  Glory Rosebush ULTRA test strip 1 each 3 (three) times daily.  . Probiotic Product (PROBIOTIC DAILY PO) Take by mouth.  . rosuvastatin (CRESTOR) 40 MG tablet Take 1 tablet (40 mg total) by mouth at bedtime.  . Semaglutide (RYBELSUS) 14 MG TABS Take 14 mg by mouth daily. Take after completing 7 mg x 30 days  . Semaglutide (RYBELSUS) 7 MG TABS Take 7 mg by mouth daily.  . Testosterone 20.25 MG/ACT (1.62%) GEL Apply 1 pump to each shoulder daily  . vitamin B-12 (CYANOCOBALAMIN) 500 MCG tablet Take 5,000 mcg by mouth daily.   . [DISCONTINUED] Semaglutide (RYBELSUS) 3 MG TABS Take 3 mg by mouth daily. Take on an empty stomach and wait 30 minutes before meal   No facility-administered encounter medications on file as of 02/16/2020.     Objective:   Goals Addressed              This Visit's Progress     Patient Stated   .  PharmD "I can't afford this medication" (pt-stated)        CARE PLAN ENTRY (see longtitudinal plan of care for additional care plan information)  Current Barriers:  . Diabetes: uncontrolled; complicated by chronic medical conditions including CAD, HTN, HLD, fatty liver, most recent A1c 8.2% o Collaborating w/ [name] regarding patient assistance for Rybelsus. Approved, 30 day supply of 7 mg and 30 day supply of 14 mg  should be shipping, but Novo shipment has been delayed. [name] contacted Novo today to acquire immediate supply billing information for a 1 time fill for free at his local pharmacy. Scripts for 7 mg and 14 mg tab need to be sent to the pharmacy  o Does note some constipation. Notes it may have been going on for the time he has been on Rybelsus 3 mg. Notes he is still "regular" but "not as regular as I'd like"  . Most recent eGFR: ~85 mL/min . Current antihyperglycemic regimen: metformin XR 1000 mg BID, Jardiance 25 mg (prescribed, but awaiting patient portion of Jardiance assistance program  application); Rybelsus 3 mg daily (plan to increase to 7 mg daily once received from [employer] patient assistance program) . Cardiovascular risk reduction: o Current hypertensive regimen: lisinopril 2.5 mg QAM, metoprolol tartrate 50 mg BID  o Current hyperlipidemia regimen: ezetimibe 10 mg daily, rosuvastatin 20 mg daily, Vascepa 2 g BID o Current antiplatelet regimen: ASA 81, clopidogrel 75 mg daily   Pharmacist Clinical Goal(s):  Marland Kitchen Over the next 90 days, patient will work with PharmD and primary care provider to address optimized medication management  Interventions: . Comprehensive medication review performed, medication list updated in electronic medical record . Inter-disciplinary care team collaboration (see longitudinal plan of care) . Sent scripts for Rybelsus 7 mg once daily x 30, then increase to Rybelsus 14 mg x 30 days. [name] to call billing information from [employer] to [facility]. Shipment from [employer] is still in transit, patient will be notified when it arrives to the clinic for pick up . Encouraged hydration + increased dietary fiber to combat bowel irregularity. Will follow to see if higher dose of Rybelsus exacerbates this . Patient dropped signed Jardiance application off at clinic. Will pass along to [name] for submission to [facility] for assistance.  Patient Self Care Activities:  . Patient will check blood glucose daily, document, and provide at future appointments . Patient will take medications as prescribed . Patient will report any questions or concerns to provider   Please see past updates related to this goal by clicking on the "Past Updates" button in the selected goal          Plan:  - Will f/u in ~ 2 weeks as previously scheduled  [name], PharmD, [place], [place] Pharmacist [place] [phone]

## 2020-02-16 NOTE — Patient Outreach (Addendum)
[facility]) Care Management  02/16/2020  Kevin Ryan 05/12/1951 [id number]  Care coordination call placed to [name] in regards to patient's application for Rybelsus.  Spoke to [name] who informed the medication has not shipped and they are having shipping delays and to check back Thursday and they may be able to offer a one time shipping voucher. Informed [place] that is what they told me last week and that is why I am calling today. [place] was able to transfer me to [name] who was able to provide me the billing information for a 30 days supply for the 7mg  and a 30 days supply of the 14mg  tablets.  For the 7mg  the billing information is as follows BIN [id number] PCN [place] [employer] [id number] ID [id number]  For the 14mg  the billing information is as follows BIN [id number] PCN [place] [employer] [id number] ID [id number]  Will outreach patient's pharmacy with this information.  [name] P. [name], [name]  [phone]

## 2020-02-16 NOTE — Patient Outreach (Signed)
[place] [facility]) Care Management  02/16/2020  JACKSTON OAXACA 1950-10-03 [id number]  ADDENDUM  Successful outgoing call placed to [employer] and [place].  Spoke to [place] at [employer] who was able to get Amy on the line at [employer]. Lennox Grumbles was able to walk Amy thru the billing process and she was able to process 2 successful claims for Rybelus 7mg  and Rybelsus 14mg . Amy informed the medication would have to be ordered and she would have tomorrow after 2pm.  Unsuccessful outreach call placed to patient in regards to the above information. Unfortunately he did not answer the phone, HIPAA compliant voicemail left.  Duchess Armendarez P. Anira Senegal, [place]  [phone]

## 2020-02-16 NOTE — Patient Outreach (Addendum)
[facility] [facility]) Care Management  02/16/2020  TAYDEN NICHELSON [birth year] [id number]  ADDENDUM  Successful outreach call placed to patient in regards to Rybelsus application with [employer].  Spoke to patient, HIPAA identifiers verified.  Patient was informed that a 30 days supply of his Rybelsus 7mg  and Rybelsus 14mg  was called to his local pharmacy for pick up. Informed him that a voucher for both strengths was [facility] to [facility] for her to use to bill the claims so they would be no charge for the patient due to [place] shipping delays. Patient verbalized understanding.  Patient also informed he was having some constipation and was unsure if it was due to his medication or another medication. Informed him I would route a message to embedded [facility] RPh [name]. Patient informed he had a call into her as well.  Patient also informed he was going to bring by his BI applicaiton for Jardiance.  Will route note to [place] as Juluis Rainier and will close patient's case if application not received back in 15 business days.  Hykeem Ojeda P. Dajana Gehrig, [place]  [phone]

## 2020-02-16 NOTE — Telephone Encounter (Signed)
Sorry for the confusion. Icosapent ethyl PA was denied because brand name Vascepa is preferred. That was filled at the pharmacy, w/ his [place] billing information, and cost was $0.   [name] is working on Rybelsus f/u right now to determine when it should arrive, or secure a voucher from [employer] for him to use to pick up a one time supply while awaiting the medication from the company.

## 2020-02-16 NOTE — Patient Instructions (Signed)
Visit Information  Goals Addressed              This Visit's Progress     Patient Stated   .  PharmD "I can't afford this medication" (pt-stated)        CARE PLAN ENTRY (see longtitudinal plan of care for additional care plan information)  Current Barriers:  . Diabetes: uncontrolled; complicated by chronic medical conditions including CAD, HTN, HLD, fatty liver, most recent A1c 8.2% o Collaborating w/ [name] regarding patient assistance for Rybelsus. Approved, 30 day supply of 7 mg and 30 day supply of 14 mg should be shipping, but Novo shipment has been delayed. [name] contacted Novo today to acquire immediate supply billing information for a 1 time fill for free at his local pharmacy. Scripts for 7 mg and 14 mg tab need to be sent to the pharmacy  o Does note some constipation. Notes it may have been going on for the time he has been on Rybelsus 3 mg. Notes he is still "regular" but "not as regular as I'd like"  . Most recent eGFR: ~85 mL/min . Current antihyperglycemic regimen: metformin XR 1000 mg BID, Jardiance 25 mg (prescribed, but awaiting patient portion of Jardiance assistance program application); Rybelsus 3 mg daily (plan to increase to 7 mg daily once received from [employer] patient assistance program) . Cardiovascular risk reduction: o Current hypertensive regimen: lisinopril 2.5 mg QAM, metoprolol tartrate 50 mg BID  o Current hyperlipidemia regimen: ezetimibe 10 mg daily, rosuvastatin 20 mg daily, Vascepa 2 g BID o Current antiplatelet regimen: ASA 81, clopidogrel 75 mg daily   Pharmacist Clinical Goal(s):  Marland Kitchen Over the next 90 days, patient will work with PharmD and primary care provider to address optimized medication management  Interventions: . Comprehensive medication review performed, medication list updated in electronic medical record . Inter-disciplinary care team collaboration (see longitudinal plan of care) . Sent scripts for Rybelsus 7 mg once daily x 30, then  increase to Rybelsus 14 mg x 30 days. [name] to call billing information from [employer] to [facility]. Shipment from [employer] is still in transit, patient will be notified when it arrives to the clinic for pick up . Encouraged hydration + increased dietary fiber to combat bowel irregularity. Will follow to see if higher dose of Rybelsus exacerbates this . Patient dropped signed Jardiance application off at clinic. Will pass along to [name] for submission to [facility] for assistance.  Patient Self Care Activities:  . Patient will check blood glucose daily, document, and provide at future appointments . Patient will take medications as prescribed . Patient will report any questions or concerns to provider   Please see past updates related to this goal by clicking on the "Past Updates" button in the selected goal         The patient verbalized understanding of instructions provided today and agreed to receive a mailed copy of patient instruction and/or educational materials.  Plan:  - Will f/u in ~ 2 weeks as previously scheduled  [name], PharmD, [place], [place] Pharmacist [name] [phone]

## 2020-02-18 ENCOUNTER — Telehealth: Payer: Self-pay | Admitting: Internal Medicine

## 2020-02-18 NOTE — Telephone Encounter (Signed)
Received patient's Rybelsus 7 mg and 14 mg in office from patient assistance. Patient's name, DOB, and medication instructions placed on each box. Two boxes in total.   Patient informed and verbalized understanding.

## 2020-02-22 ENCOUNTER — Encounter: Payer: Self-pay | Admitting: Internal Medicine

## 2020-02-24 ENCOUNTER — Other Ambulatory Visit: Payer: Self-pay | Admitting: Pharmacy Technician

## 2020-02-24 NOTE — Patient Outreach (Signed)
[facility]) Care Management  02/24/2020  Kevin Ryan Aug 31, 1950 [id number]   Received both patient and provider portion(s) of patient assistance application(s) for Jardiance. Faxed completed application and required documents into BI.  Will follow up with company(ies) in 5-10 business days to check status of application(s).  [name] P. [name], [name]  [phone]

## 2020-02-26 DIAGNOSIS — E669 Obesity, unspecified: Secondary | ICD-10-CM | POA: Insufficient documentation

## 2020-03-01 ENCOUNTER — Other Ambulatory Visit: Payer: Self-pay | Admitting: Pharmacy Technician

## 2020-03-01 NOTE — Patient Outreach (Signed)
[place] [facility]) Care Management  03/01/2020  Kevin Ryan Jun 28, 1951 [id number]   Care coordination call placed to BI in regards to patient's application for Jardiance.  Spoke to [place] who was unable to locate patient in her system even though the application was faxed to them on 02/24/2020. She suggested to try back in a few days and to also re fax the application.  Will re fax application when back in the office later this week and follow up in 3-7 business days.  [name] P. [name], [place]  [phone]

## 2020-03-02 ENCOUNTER — Ambulatory Visit (INDEPENDENT_AMBULATORY_CARE_PROVIDER_SITE_OTHER): Payer: Medicare Other | Admitting: Pharmacist

## 2020-03-02 DIAGNOSIS — E1165 Type 2 diabetes mellitus with hyperglycemia: Secondary | ICD-10-CM | POA: Diagnosis not present

## 2020-03-02 DIAGNOSIS — E785 Hyperlipidemia, unspecified: Secondary | ICD-10-CM

## 2020-03-02 NOTE — Patient Instructions (Signed)
Visit Information  Goals Addressed              This Visit's Progress     Patient Stated   .  PharmD "I can't afford this medication" (pt-stated)        CARE PLAN ENTRY (see longtitudinal plan of care for additional care plan information)  Current Barriers:  . Social, community, and financial barriers: o Awaiting decision from [employer] regarding [place] assistance application o Patient reports he likely will have orthopedic surgery in the next ~month . Diabetes: uncontrolled; complicated by chronic medical conditions including CAD, HTN, HLD, fatty liver, most recent A1c 8.2% . Most recent eGFR: ~85 mL/min . Current antihyperglycemic regimen: metformin XR 1000 mg QAM, 500 mg QPM (reports that he hasn't tried to increase to 1000 mg QPM yet because of his stomach not being back to baseline) Jardiance 25 mg (prescribed, but awaiting Jardiance assistance program decision; Rybelsus 7 mg daily; glipizide XL 5 mg QAM- patient was confused and did not stop glipizide when previously recommended o Does note "not feeling myself" since increasing Rybelsus dose ~10 days ago. Denies GI upset, can't really describe the sensation o APPROVED for assistance through [employer] for Rybelsus  . Current glucose readings:   Fasting Afternoon  18-Jul 133   19-Jul 119   20-Jul 108   21-Jul 145   22-Jul 145 106  23-Jul 139   24-Jul 163   25-Jul 146   26-Jul 140   27-Jul 140   28-Jul 143   29-Jul 132   30-Jul 114   31-Jul 128   1-Aug 136   2-Aug 119   3-Aug 122    133    . Cardiovascular risk reduction: o Current hypertensive regimen: lisinopril 2.5 mg QAM, metoprolol tartrate 50 mg BID;   o Current hyperlipidemia regimen: ezetimibe 10 mg daily, rosuvastatin 20 mg daily, Vascepa 2 g BID - reports today that he is also taking OTC fish oil 1400 mg daily  o Current antiplatelet regimen: ASA 81, clopidogrel 75 mg daily   Pharmacist Clinical Goal(s):  Marland Kitchen Over the next 90 days, patient will work with  PharmD and primary care provider to address optimized medication management  Interventions: . Comprehensive medication review performed, medication list updated in electronic medical record . Inter-disciplinary care team collaboration (see longitudinal plan of care) . Reviewed goal A1c, goal fasting, and goal 2 hour post prandial glucose readings.  . Encouraged to continue Rybelsus 7 mg daily, metformin XR 1000 mg QAM, 500 mg QPM. Stop glipizide to reduce risk of hypoglycemia. Patient verbalizes understanding. Encouraged to continue to check fasting sugars, but also check 2 hour post prandial sugars after supper to evaluate readings throughout the day.  . Will await decision on Jardiance approval through [place].  Marland Kitchen As patient is on maximum dose Vascepa, recommended he d/c OTC fish oil. He verbalized understanding.   Patient Self Care Activities:  . Patient will check blood glucose daily, document, and provide at future appointments . Patient will take medications as prescribed . Patient will report any questions or concerns to provider   Please see past updates related to this goal by clicking on the "Past Updates" button in the selected goal         The patient verbalized understanding of instructions provided today and declined a print copy of patient instruction materials.    Plan:  - Scheduled f/u call in ~ 4 weeks.  [name], PharmD, [place], CPP Clinical Pharmacist [facility]  [employer] [phone]

## 2020-03-02 NOTE — Chronic Care Management (AMB) (Signed)
Chronic Care Management   Follow Up Note   03/02/2020 Name: Kevin Ryan MRN: [id number] DOB: 02/05/1951  Referred by: McLean-Scocuzza, Tracy N, MD Reason for referral : Chronic Care Management (Medication Management)   Kevin Ryan is a 69 y.o. year old male who is a primary care patient of McLean-Scocuzza, Tracy N, MD. The CCM team was consulted for assistance with chronic disease management and care coordination needs.    Contacted patient for medication management review.  Review of patient status, including review of consultants reports, relevant laboratory and other test results, and collaboration with appropriate care team members and the patient's provider was performed as part of comprehensive patient evaluation and provision of chronic care management services.    SDOH (Social Determinants of Health) assessments performed: Yes See Care Plan activities for detailed interventions related to SDOH)  SDOH Interventions     Most Recent Value  SDOH Interventions  Financial Strain Interventions Other (Comment)  [medication assistance]       Outpatient Encounter Medications as of 03/02/2020  Medication Sig Note  . aspirin 81 MG tablet Take 81 mg by mouth daily.   . cholecalciferol (VITAMIN D) 25 MCG (1000 UNIT) tablet Take 1,000 Units by mouth daily.    . clopidogrel (PLAVIX) 75 MG tablet Take 75 mg by mouth daily.   . ezetimibe (ZETIA) 10 MG tablet Take 1 tablet (10 mg total) by mouth daily.   . icosapent Ethyl (VASCEPA) 1 g capsule Take 2 capsules (2 g total) by mouth 2 (two) times daily.   . lisinopril (ZESTRIL) 2.5 MG tablet Take 1 tablet (2.5 mg total) by mouth daily.   . MAGNESIUM PO Take by mouth daily.   . metFORMIN (GLUCOPHAGE XR) 500 MG 24 hr tablet Take 2 tablets (1,000 mg total) by mouth in the morning and at bedtime. 03/02/2020: 1000 mg QAM, 500 mg QPM  . metoprolol tartrate (LOPRESSOR) 50 MG tablet Take 1 tablet (50 mg total) by mouth 2 (two) times daily.   .  ONETOUCH ULTRA test strip 1 each 3 (three) times daily.   . Probiotic Product (PROBIOTIC DAILY PO) Take by mouth.   . rosuvastatin (CRESTOR) 40 MG tablet Take 1 tablet (40 mg total) by mouth at bedtime.   . Semaglutide (RYBELSUS) 7 MG TABS Take 7 mg by mouth daily.   . vitamin B-12 (CYANOCOBALAMIN) 500 MCG tablet Take 5,000 mcg by mouth daily.    . empagliflozin (JARDIANCE) 25 MG TABS tablet Take 1 tablet (25 mg total) by mouth daily before breakfast. (Patient not taking: Reported on 03/02/2020)   . meclizine (ANTIVERT) 25 MG tablet Take 25 mg by mouth 3 (three) times daily as needed for dizziness.   . meloxicam (MOBIC) 15 MG tablet Take 1 tablet (15 mg total) by mouth daily.   . Semaglutide (RYBELSUS) 14 MG TABS Take 14 mg by mouth daily. Take after completing 7 mg x 30 days (Patient not taking: Reported on 03/02/2020)   . Testosterone 20.25 MG/ACT (1.62%) GEL Apply 1 pump to each shoulder daily    No facility-administered encounter medications on file as of 03/02/2020.     Objective:   Goals Addressed              This Visit's Progress     Patient Stated   .  PharmD "I can't afford this medication" (pt-stated)        CARE PLAN ENTRY (see longtitudinal plan of care for additional care plan information)  Current Barriers:  .   Social, community, and financial barriers: o Awaiting decision from [employer] regarding Jardiance assistance application o Patient reports he likely will have orthopedic surgery in the next ~month . Diabetes: uncontrolled; complicated by chronic medical conditions including CAD, HTN, HLD, fatty liver, most recent A1c 8.2% . Most recent eGFR: ~85 mL/min . Current antihyperglycemic regimen: metformin XR 1000 mg QAM, 500 mg QPM (reports that he hasn't tried to increase to 1000 mg QPM yet because of his stomach not being back to baseline) Jardiance 25 mg (prescribed, but awaiting Jardiance assistance program decision; Rybelsus 7 mg daily; glipizide XL 5 mg QAM- patient was  confused and did not stop glipizide when previously recommended o Does note "not feeling myself" since increasing Rybelsus dose ~10 days ago. Denies GI upset, can't really describe the sensation o APPROVED for assistance through [employer] for Rybelsus  . Current glucose readings:   Fasting Afternoon  18-Jul 133   19-Jul 119   20-Jul 108   21-Jul 145   22-Jul 145 106  23-Jul 139   24-Jul 163   25-Jul 146   26-Jul 140   27-Jul 140   28-Jul 143   29-Jul 132   30-Jul 114   31-Jul 128   1-Aug 136   2-Aug 119   3-Aug 122    133    . Cardiovascular risk reduction: o Current hypertensive regimen: lisinopril 2.5 mg QAM, metoprolol tartrate 50 mg BID;   o Current hyperlipidemia regimen: ezetimibe 10 mg daily, rosuvastatin 20 mg daily, Vascepa 2 g BID - reports today that he is also taking OTC fish oil 1400 mg daily  o Current antiplatelet regimen: ASA 81, clopidogrel 75 mg daily   Pharmacist Clinical Goal(s):  . Over the next 90 days, patient will work with PharmD and primary care provider to address optimized medication management  Interventions: . Comprehensive medication review performed, medication list updated in electronic medical record . Inter-disciplinary care team collaboration (see longitudinal plan of care) . Reviewed goal A1c, goal fasting, and goal 2 hour post prandial glucose readings.  . Encouraged to continue Rybelsus 7 mg daily, metformin XR 1000 mg QAM, 500 mg QPM. Stop glipizide to reduce risk of hypoglycemia. Patient verbalizes understanding. Encouraged to continue to check fasting sugars, but also check 2 hour post prandial sugars after supper to evaluate readings throughout the day.  . Will await decision on Jardiance approval through [employer].  . As patient is on maximum dose Vascepa, recommended he d/c OTC fish oil. He verbalized understanding.   Patient Self Care Activities:  . Patient will check blood glucose daily, document, and provide at future  appointments . Patient will take medications as prescribed . Patient will report any questions or concerns to provider   Please see past updates related to this goal by clicking on the "Past Updates" button in the selected goal          Plan:  - Scheduled f/u call in ~ 4 weeks.  Catie Travis, PharmD, BCACP, CPP Clinical Pharmacist [facility] [phone]   

## 2020-03-03 ENCOUNTER — Other Ambulatory Visit: Payer: Self-pay | Admitting: Pharmacy Technician

## 2020-03-03 NOTE — Patient Outreach (Signed)
[facility]) Care Management  03/03/2020  BOOKERT GUZZI Apr 24, 1951 [id number]  Care coordination call placed to [employer] in regards to [facility] application.  Spoke to Shanon Brow who informed patient was APPROVED 03/02/2020-07/30/2020. He informed the medication is due to be delivered Friday 03/05/2020 to his home address.  Will follow up with patient in 3-5 business days to confirm and to discuss refill procedure.  Kaydense Rizo P. Ricco Dershem, [place]  [phone]

## 2020-03-05 ENCOUNTER — Other Ambulatory Visit: Payer: Self-pay | Admitting: Pharmacy Technician

## 2020-03-05 NOTE — Patient Outreach (Addendum)
[facility]) Care Management  03/05/2020  Kevin Ryan Dec 28, 1950 [id number]   Successful call placed to patient regarding patient assistance medication delivery of Jardiance from [facility] HIPAA identifiers verified.   Patient informed he has not received the medication yet. He informs the mail has not come today. Informed patient to call me back today if he does receive it. Patient was agreeable to this plan.   Patient also informed that he would like for me to relay a message to embedded [place] which is that he found the Rybelsus that he informed her he did not have.  Care coordination call placed to embedded [place] and informed her of this information. She verbalized understanding.   Follow up:  Will follow up with patient in 3-5 business days if call is not returned.  Shanieka Blea P. Nyomi Howser, [place]  [phone]

## 2020-03-08 ENCOUNTER — Other Ambulatory Visit: Payer: Self-pay | Admitting: Pharmacy Technician

## 2020-03-08 NOTE — Patient Outreach (Signed)
Kevin Ryan [facility]) Care Management  03/08/2020  [name] 19-May-1951 [id number]    Successful call placed to patient regarding patient assistance medication delivery of Jardiance from [facility] HIPAA identifiers verified.   Patient informs he received Jardiance. Discussed refill procedure with him which will require him to call BI to order his refill around the end of October. Informed patient the [phone number could be located in the paperwork or on the pharmacy label around the mediation bottle. Patient verbalized understanding. Confirmed patient had name and number as no other questions or concerns.  Follow up:  Will route note to embedded [facility] RPh [name]  for case closure  Karema Tocci P. Edelmiro Innocent, [place]  [phone]

## 2020-03-25 ENCOUNTER — Telehealth: Payer: Medicare Other

## 2020-04-01 ENCOUNTER — Ambulatory Visit: Payer: Medicare Other | Admitting: Pharmacist

## 2020-04-01 DIAGNOSIS — E785 Hyperlipidemia, unspecified: Secondary | ICD-10-CM

## 2020-04-01 DIAGNOSIS — E1165 Type 2 diabetes mellitus with hyperglycemia: Secondary | ICD-10-CM

## 2020-04-01 DIAGNOSIS — I2510 Atherosclerotic heart disease of native coronary artery without angina pectoris: Secondary | ICD-10-CM

## 2020-04-01 NOTE — Patient Instructions (Signed)
Visit Information  Goals Addressed              This Visit's Progress     Patient Stated   .  PharmD "I can't afford this medication" (pt-stated)        CARE PLAN ENTRY (see longtitudinal plan of care for additional care plan information)  Current Barriers:  . Social, community, and financial barriers: o Reports that he has not received the COVID vaccine and does not plan to.  o Reports that shoulder replacement surgery is likely. Worries about having his A1c under control  o Does indicate that in the past few weeks, has developed occasional episodes of fecal urgency. Is unsure if related to medications or foods. Happening no more than once per week . Diabetes: uncontrolled; complicated by chronic medical conditions including CAD, HTN, HLD, fatty liver, most recent A1c 8.2% . Most recent eGFR: ~85 mL/min . Current antihyperglycemic regimen: metformin XR 1000 mg BID, Jardiance 25 mg, Rybelsus 7 mg daily- plan to increase to 14 mg once supply of 7 mg is complete o APPROVED for assistance through [employer] for Rybelsus through 06/29/20 o APPROVED for assistance through [facility] for Jardiance through 07/30/20 . Current glucose readings:  o Reports that fasting and 2 hour post prandial readings are averaging 130-140s . Cardiovascular risk reduction: o Current hypertensive regimen: lisinopril 2.5 mg QAM, metoprolol tartrate 50 mg BID; last clinic BP at goal o Current hyperlipidemia regimen: ezetimibe 10 mg daily, rosuvastatin 20 mg daily, Vascepa 2 g BID; will be due for updated lipid panel with next lab work - APPROVED for [employer] for [employer] o Current antiplatelet regimen: ASA 81, clopidogrel 75 mg daily; s/p MI in 2014. Unsure if DAPT is still necessary, patient follows w/ cardiology Dr. Saralyn Pilar  Pharmacist Clinical Goal(s):  Marland Kitchen Over the next 90 days, patient will work with PharmD and primary care provider to address optimized medication  management  Interventions: . Comprehensive medication review performed, medication list updated in electronic medical record . Inter-disciplinary care team collaboration (see longitudinal plan of care) . Reviewed goal A1c, goal fasting, and goal 2 hour post prandial glucose readings. . Discussed holding on increasing Rybelsus to 14 mg daily, however, patient elects to do so as planned. He will let me know if significant increase in any GI disturbances and we can reduce to 7 mg (will need to send updated script for 7 mg to [employer]). He verbalizes understanding. Continue metformin XR 1000 mg BID, Jardiance 25 mg daily.  . Discussed his fears of COVID vaccination d/t "horror stories" he has heard from family members. Provided education, benefits, and risk of COVID-related complications vs any vaccination concerns. Encouraged patient to reach back out to me with any questions or concerns.   Patient Self Care Activities:  . Patient will check blood glucose daily, document, and provide at future appointments . Patient will take medications as prescribed . Patient will report any questions or concerns to provider   Please see past updates related to this goal by clicking on the "Past Updates" button in the selected goal         The patient verbalized understanding of instructions provided today and declined a print copy of patient instruction materials.    Plan:  - Scheduled f/u call in ~ 8 weeks  [name], PharmD, [place], [place] Pharmacist [place] [phone]

## 2020-04-01 NOTE — Chronic Care Management (AMB) (Signed)
Chronic Care Management   Follow Up Note   04/01/2020 Name: Kevin Ryan MRN: [id number] DOB: November 18, 1950  Referred by: McLean-Scocuzza, Nino Glow, MD Reason for referral : Chronic Care Management (Medication Management)   Kevin Ryan is a 69 y.o. year old male who is a primary care patient of McLean-Scocuzza, Nino Glow, MD. The CCM team was consulted for assistance with chronic disease management and care coordination needs.   Contacted patient for medication management review.  Review of patient status, including review of consultants reports, relevant laboratory and other test results, and collaboration with appropriate care team members and the patient's provider was performed as part of comprehensive patient evaluation and provision of chronic care management services.    SDOH (Social Determinants of Health) assessments performed: Yes See Care Plan activities for detailed interventions related to SDOH)  SDOH Interventions     Most Recent Value  SDOH Interventions  Financial Strain Interventions Other (Comment)  [medication assistance]       Outpatient Encounter Medications as of 04/01/2020  Medication Sig  . aspirin 81 MG tablet Take 81 mg by mouth daily.  . cholecalciferol (VITAMIN D) 25 MCG (1000 UNIT) tablet Take 1,000 Units by mouth daily.   . clopidogrel (PLAVIX) 75 MG tablet Take 75 mg by mouth daily.  . empagliflozin (JARDIANCE) 25 MG TABS tablet Take 1 tablet (25 mg total) by mouth daily before breakfast.  . ezetimibe (ZETIA) 10 MG tablet Take 1 tablet (10 mg total) by mouth daily.  Marland Kitchen icosapent Ethyl (VASCEPA) 1 g capsule Take 2 capsules (2 g total) by mouth 2 (two) times daily.  Marland Kitchen lisinopril (ZESTRIL) 2.5 MG tablet Take 1 tablet (2.5 mg total) by mouth daily.  Marland Kitchen MAGNESIUM PO Take by mouth daily.  . meclizine (ANTIVERT) 25 MG tablet Take 25 mg by mouth 3 (three) times daily as needed for dizziness.  . meloxicam (MOBIC) 15 MG tablet Take 1 tablet (15 mg total) by mouth  daily.  . metFORMIN (GLUCOPHAGE XR) 500 MG 24 hr tablet Take 2 tablets (1,000 mg total) by mouth in the morning and at bedtime.  . metoprolol tartrate (LOPRESSOR) 50 MG tablet Take 1 tablet (50 mg total) by mouth 2 (two) times daily.  Glory Rosebush ULTRA test strip 1 each 3 (three) times daily.  . Probiotic Product (PROBIOTIC DAILY PO) Take by mouth.  . rosuvastatin (CRESTOR) 40 MG tablet Take 1 tablet (40 mg total) by mouth at bedtime.  . Semaglutide (RYBELSUS) 7 MG TABS Take 7 mg by mouth daily.  . Testosterone 20.25 MG/ACT (1.62%) GEL Apply 1 pump to each shoulder daily  . vitamin B-12 (CYANOCOBALAMIN) 500 MCG tablet Take 5,000 mcg by mouth daily.   . Semaglutide (RYBELSUS) 14 MG TABS Take 14 mg by mouth daily. Take after completing 7 mg x 30 days (Patient not taking: Reported on 04/01/2020)   No facility-administered encounter medications on file as of 04/01/2020.     Objective:   Goals Addressed              This Visit's Progress     Patient Stated   .  PharmD "I can't afford this medication" (pt-stated)        CARE PLAN ENTRY (see longtitudinal plan of care for additional care plan information)  Current Barriers:  . Social, community, and financial barriers: o Reports that he has not received the COVID vaccine and does not plan to.  o Reports that shoulder replacement surgery is likely. Worries about  having his A1c under control  o Does indicate that in the past few weeks, has developed occasional episodes of fecal urgency. Is unsure if related to medications or foods. Happening no more than once per week . Diabetes: uncontrolled; complicated by chronic medical conditions including CAD, HTN, HLD, fatty liver, most recent A1c 8.2% . Most recent eGFR: ~85 mL/min . Current antihyperglycemic regimen: metformin XR 1000 mg BID, Jardiance 25 mg, Rybelsus 7 mg daily- plan to increase to 14 mg once supply of 7 mg is complete o APPROVED for assistance through [employer] for Rybelsus  through 06/29/20 o APPROVED for assistance through [facility] for Jardiance through 07/30/20 . Current glucose readings:  o Reports that fasting and 2 hour post prandial readings are averaging 130-140s . Cardiovascular risk reduction: o Current hypertensive regimen: lisinopril 2.5 mg QAM, metoprolol tartrate 50 mg BID; last clinic BP at goal o Current hyperlipidemia regimen: ezetimibe 10 mg daily, rosuvastatin 20 mg daily, Vascepa 2 g BID; will be due for updated lipid panel with next lab work - APPROVED for [employer] for [employer] o Current antiplatelet regimen: ASA 81, clopidogrel 75 mg daily; s/p MI in 2014. Unsure if DAPT is still necessary, patient follows w/ cardiology Dr. Saralyn Pilar  Pharmacist Clinical Goal(s):  Marland Kitchen Over the next 90 days, patient will work with PharmD and primary care provider to address optimized medication management  Interventions: . Comprehensive medication review performed, medication list updated in electronic medical record . Inter-disciplinary care team collaboration (see longitudinal plan of care) . Reviewed goal A1c, goal fasting, and goal 2 hour post prandial glucose readings. . Discussed holding on increasing Rybelsus to 14 mg daily, however, patient elects to do so as planned. He will let me know if significant increase in any GI disturbances and we can reduce to 7 mg (will need to send updated script for 7 mg to [employer]). He verbalizes understanding. Continue metformin XR 1000 mg BID, Jardiance 25 mg daily.  . Discussed his fears of COVID vaccination d/t "horror stories" he has heard from family members. Provided education, benefits, and risk of COVID-related complications vs any vaccination concerns. Encouraged patient to reach back out to me with any questions or concerns.   Patient Self Care Activities:  . Patient will check blood glucose daily, document, and provide at future appointments . Patient will take medications as prescribed . Patient will  report any questions or concerns to provider   Please see past updates related to this goal by clicking on the "Past Updates" button in the selected goal          Plan:  - Scheduled f/u call in ~ 8 weeks  [name], PharmD, [place], [place] Pharmacist [place] [phone]

## 2020-04-21 ENCOUNTER — Telehealth: Payer: Self-pay | Admitting: Internal Medicine

## 2020-04-21 NOTE — Telephone Encounter (Signed)
Pt brought in handicap placard to be filled out. Placed in folder up front. Please call when complete

## 2020-04-21 NOTE — Telephone Encounter (Signed)
Filled out and placed on your desk

## 2020-04-21 NOTE — Telephone Encounter (Signed)
Ready for pick up

## 2020-04-22 NOTE — Telephone Encounter (Signed)
Patient informed and verbalized understanding.  Form placed upfront

## 2020-05-03 ENCOUNTER — Other Ambulatory Visit: Payer: Self-pay | Admitting: Internal Medicine

## 2020-05-03 DIAGNOSIS — E782 Mixed hyperlipidemia: Secondary | ICD-10-CM

## 2020-05-03 MED ORDER — ICOSAPENT ETHYL 1 G PO CAPS: 2.0000 g | ORAL_CAPSULE | Freq: Two times a day (BID) | ORAL | 4 refills | Status: DC

## 2020-05-05 ENCOUNTER — Ambulatory Visit (INDEPENDENT_AMBULATORY_CARE_PROVIDER_SITE_OTHER): Payer: Medicare Other | Admitting: Pharmacist

## 2020-05-05 ENCOUNTER — Other Ambulatory Visit: Payer: Self-pay

## 2020-05-05 DIAGNOSIS — E1165 Type 2 diabetes mellitus with hyperglycemia: Secondary | ICD-10-CM | POA: Diagnosis not present

## 2020-05-05 DIAGNOSIS — I152 Hypertension secondary to endocrine disorders: Secondary | ICD-10-CM | POA: Diagnosis not present

## 2020-05-05 DIAGNOSIS — I2510 Atherosclerotic heart disease of native coronary artery without angina pectoris: Secondary | ICD-10-CM

## 2020-05-05 DIAGNOSIS — E1159 Type 2 diabetes mellitus with other circulatory complications: Secondary | ICD-10-CM | POA: Diagnosis not present

## 2020-05-05 DIAGNOSIS — E782 Mixed hyperlipidemia: Secondary | ICD-10-CM

## 2020-05-05 MED ORDER — ICOSAPENT ETHYL 1 G PO CAPS: 2.0000 g | ORAL_CAPSULE | Freq: Two times a day (BID) | ORAL | 3 refills | Status: DC

## 2020-05-05 MED ORDER — METFORMIN HCL ER 500 MG PO TB24: 1000.0000 mg | ORAL_TABLET | Freq: Two times a day (BID) | ORAL | 1 refills | Status: DC

## 2020-05-05 NOTE — Chronic Care Management (AMB) (Signed)
Chronic Care Management   Follow Up Note   05/05/2020 Name: Kevin Ryan MRN: [id number] DOB: 1951/01/15  Referred by: McLean-Scocuzza, Nino Glow, MD Reason for referral : Chronic Care Management (Medication Management)   Kevin Ryan is a 69 y.o. year old male who is a primary care patient of McLean-Scocuzza, Nino Glow, MD. The CCM team was consulted for assistance with chronic disease management and care coordination needs.    Received call from patient with medication access needs.   Review of patient status, including review of consultants reports, relevant laboratory and other test results, and collaboration with appropriate care team members and the patient's provider was performed as part of comprehensive patient evaluation and provision of chronic care management services.    SDOH (Social Determinants of Health) assessments performed: No See Care Plan activities for detailed interventions related to [facility])     Outpatient Encounter Medications as of 05/05/2020  Medication Sig  . aspirin 81 MG tablet Take 81 mg by mouth daily.  . cholecalciferol (VITAMIN D) 25 MCG (1000 UNIT) tablet Take 1,000 Units by mouth daily.   . clopidogrel (PLAVIX) 75 MG tablet Take 75 mg by mouth daily.  . empagliflozin (JARDIANCE) 25 MG TABS tablet Take 1 tablet (25 mg total) by mouth daily before breakfast.  . ezetimibe (ZETIA) 10 MG tablet Take 1 tablet (10 mg total) by mouth daily.  Marland Kitchen icosapent Ethyl (VASCEPA) 1 g capsule Take 2 capsules (2 g total) by mouth 2 (two) times daily.  Marland Kitchen lisinopril (ZESTRIL) 2.5 MG tablet Take 1 tablet (2.5 mg total) by mouth daily.  Marland Kitchen MAGNESIUM PO Take by mouth daily.  . meclizine (ANTIVERT) 25 MG tablet Take 25 mg by mouth 3 (three) times daily as needed for dizziness.  . meloxicam (MOBIC) 15 MG tablet Take 1 tablet (15 mg total) by mouth daily.  . metFORMIN (GLUCOPHAGE XR) 500 MG 24 hr tablet Take 2 tablets (1,000 mg total) by mouth in the morning and at bedtime.  .  metoprolol tartrate (LOPRESSOR) 50 MG tablet Take 1 tablet (50 mg total) by mouth 2 (two) times daily.  Glory Rosebush ULTRA test strip 1 each 3 (three) times daily.  . Probiotic Product (PROBIOTIC DAILY PO) Take by mouth.  . rosuvastatin (CRESTOR) 40 MG tablet Take 1 tablet (40 mg total) by mouth at bedtime.  . Semaglutide (RYBELSUS) 14 MG TABS Take 14 mg by mouth daily. Take after completing 7 mg x 30 days (Patient not taking: Reported on 04/01/2020)  . Semaglutide (RYBELSUS) 7 MG TABS Take 7 mg by mouth daily.  . Testosterone 20.25 MG/ACT (1.62%) GEL Apply 1 pump to each shoulder daily  . vitamin B-12 (CYANOCOBALAMIN) 500 MCG tablet Take 5,000 mcg by mouth daily.   . [DISCONTINUED] icosapent Ethyl (VASCEPA) 1 g capsule Take 2 capsules (2 g total) by mouth 2 (two) times daily.   No facility-administered encounter medications on file as of 05/05/2020.     Objective:   Goals Addressed              This Visit's Progress     Patient Stated   .  PharmD "I can't afford this medication" (pt-stated)        CARE PLAN ENTRY (see longtitudinal plan of care for additional care plan information)  Current Barriers:  . Social, community, and financial barriers: o Calls today requesting Vascepa refill to [facility]. Refill was sent to [facility] mail order on 10/4 . Diabetes: uncontrolled; complicated by chronic  medical conditions including CAD, HTN, HLD, fatty liver, most recent A1c 8.2% . Most recent eGFR: ~85 mL/min . Current antihyperglycemic regimen: metformin XR 1000 mg BID, Jardiance 25 mg, Rybelsus 7 mg daily- plan to increase to 14 mg once supply of 7 mg is complete o APPROVED for assistance through [employer] for Rybelsus through 06/29/20 o APPROVED for assistance through [facility] for Jardiance through 07/30/20 . Cardiovascular risk reduction: o Current hypertensive regimen: lisinopril 2.5 mg QAM, metoprolol tartrate 50 mg BID o Current hyperlipidemia regimen: ezetimibe 10 mg daily,  rosuvastatin 20 mg daily, Vascepa 2 g BID - APPROVED for [employer] funding for Vascepa o Current antiplatelet regimen: ASA 81, clopidogrel 75 mg daily; s/p MI in 2014. Unsure if DAPT is still necessary, patient follows w/ cardiology Dr. Saralyn Pilar  Pharmacist Clinical Goal(s):  Marland Kitchen Over the next 90 days, patient will work with PharmD and primary care provider to address optimized medication management  Interventions: . Contacted [facility]. They require the patient to call in to cancel an order. Called patient back, LVM with instruction for him to call [facility] mail order at [phone] to cancel Vascepa order, then to call [facility] and let them know they can fill the order.   Patient Self Care Activities:  . Patient will check blood glucose daily, document, and provide at future appointments . Patient will take medications as prescribed . Patient will report any questions or concerns to provider   Please see past updates related to this goal by clicking on the "Past Updates" button in the selected goal          Plan:  - Will outreach as previously scheduled  [name], PharmD, [place], [place] Pharmacist [place] [phone]

## 2020-05-05 NOTE — Patient Instructions (Signed)
Visit Information  Goals Addressed              This Visit's Progress     Patient Stated     PharmD "I can't afford this medication" (pt-stated)        CARE PLAN ENTRY (see longtitudinal plan of care for additional care plan information)  Current Barriers:   Social, community, and financial barriers: o Calls today requesting Vascepa refill to [facility]. Refill was sent to [facility] mail order on 10/4  Diabetes: uncontrolled; complicated by chronic medical conditions including CAD, HTN, HLD, fatty liver, most recent A1c 8.2%  Most recent eGFR: ~85 mL/min  Current antihyperglycemic regimen: metformin XR 1000 mg BID, Jardiance 25 mg, Rybelsus 7 mg daily- plan to increase to 14 mg once supply of 7 mg is complete o APPROVED for assistance through [employer] for Rybelsus through 06/29/20 o APPROVED for assistance through [employer] for Jardiance through 07/30/20  Cardiovascular risk reduction: o Current hypertensive regimen: lisinopril 2.5 mg QAM, metoprolol tartrate 50 mg BID o Current hyperlipidemia regimen: ezetimibe 10 mg daily, rosuvastatin 20 mg daily, Vascepa 2 g BID - APPROVED for [employer] funding for Vascepa o Current antiplatelet regimen: ASA 81, clopidogrel 75 mg daily; s/p MI in 2014. Unsure if DAPT is still necessary, patient follows w/ cardiology Dr. Saralyn Pilar  Pharmacist Clinical Goal(s):   Over the next 90 days, patient will work with PharmD and primary care provider to address optimized medication management  Interventions:  Contacted [facility]. They require the patient to call in to cancel an order. Called patient back, LVM with instruction for him to call [facility] mail order at [phone] to cancel Vascepa order, then to call [facility] and let them know they can fill the order.   Patient Self Care Activities:   Patient will check blood glucose daily, document, and provide at future appointments  Patient will take medications as  prescribed  Patient will report any questions or concerns to provider   Please see past updates related to this goal by clicking on the "Past Updates" button in the selected goal         The patient verbalized understanding of instructions provided today and declined a print copy of patient instruction materials.   Plan:  - Will outreach as previously scheduled  [name], PharmD, [place], [place] Pharmacist [place] [phone]

## 2020-05-14 ENCOUNTER — Ambulatory Visit (INDEPENDENT_AMBULATORY_CARE_PROVIDER_SITE_OTHER): Payer: Medicare Other | Admitting: Internal Medicine

## 2020-05-14 ENCOUNTER — Other Ambulatory Visit: Payer: Self-pay

## 2020-05-14 ENCOUNTER — Encounter: Payer: Self-pay | Admitting: Internal Medicine

## 2020-05-14 VITALS — BP 124/68 | HR 87 | Temp 98.3°F | Ht 72.99 in | Wt 237.8 lb

## 2020-05-14 DIAGNOSIS — H9202 Otalgia, left ear: Secondary | ICD-10-CM

## 2020-05-14 DIAGNOSIS — D696 Thrombocytopenia, unspecified: Secondary | ICD-10-CM | POA: Diagnosis not present

## 2020-05-14 DIAGNOSIS — I152 Hypertension secondary to endocrine disorders: Secondary | ICD-10-CM | POA: Diagnosis not present

## 2020-05-14 DIAGNOSIS — R7989 Other specified abnormal findings of blood chemistry: Secondary | ICD-10-CM | POA: Insufficient documentation

## 2020-05-14 DIAGNOSIS — E1159 Type 2 diabetes mellitus with other circulatory complications: Secondary | ICD-10-CM | POA: Diagnosis not present

## 2020-05-14 DIAGNOSIS — Z1329 Encounter for screening for other suspected endocrine disorder: Secondary | ICD-10-CM

## 2020-05-14 HISTORY — DX: Other specified abnormal findings of blood chemistry: R79.89

## 2020-05-14 LAB — COMPREHENSIVE METABOLIC PANEL WITH GFR
ALT: 18 U/L (ref 0–53)
AST: 17 U/L (ref 0–37)
Albumin: 4.5 g/dL (ref 3.5–5.2)
Alkaline Phosphatase: 77 U/L (ref 39–117)
BUN: 17 mg/dL (ref 6–23)
CO2: 29 meq/L (ref 19–32)
Calcium: 9.5 mg/dL (ref 8.4–10.5)
Chloride: 101 meq/L (ref 96–112)
Creatinine, Ser: 0.91 mg/dL (ref 0.40–1.50)
GFR: 85.22 mL/min (ref >60.00)
Glucose, Bld: 137 mg/dL — ABNORMAL HIGH (ref 70–99)
Potassium: 4.6 meq/L (ref 3.5–5.1)
Sodium: 140 meq/L (ref 135–145)
Total Bilirubin: 0.7 mg/dL (ref 0.2–1.2)
Total Protein: 7.4 g/dL (ref 6.0–8.3)

## 2020-05-14 LAB — HEMOGLOBIN A1C: Hgb A1c MFr Bld: 7.7 % — ABNORMAL HIGH (ref 4.6–6.5)

## 2020-05-14 LAB — CBC WITH DIFFERENTIAL/PLATELET
Basophils Absolute: 0.1 10*3/uL (ref 0.0–0.1)
Basophils Relative: 0.6 % (ref 0.0–3.0)
Eosinophils Absolute: 0.4 10*3/uL (ref 0.0–0.7)
Eosinophils Relative: 3.9 % (ref 0.0–5.0)
HCT: 47.2 % (ref 39.0–52.0)
Hemoglobin: 15.7 g/dL (ref 13.0–17.0)
Lymphocytes Relative: 22.6 % (ref 12.0–46.0)
Lymphs Abs: 2.3 10*3/uL (ref 0.7–4.0)
MCHC: 33.3 g/dL (ref 30.0–36.0)
MCV: 97.5 fl (ref 78.0–100.0)
Monocytes Absolute: 0.6 10*3/uL (ref 0.1–1.0)
Monocytes Relative: 6.2 % (ref 3.0–12.0)
Neutro Abs: 6.7 10*3/uL (ref 1.4–7.7)
Neutrophils Relative %: 66.7 % (ref 43.0–77.0)
Platelets: 146 10*3/uL — ABNORMAL LOW (ref 150.0–400.0)
RBC: 4.84 Mil/uL (ref 4.22–5.81)
RDW: 14.2 % (ref 11.5–15.5)
WBC: 10.1 10*3/uL (ref 4.0–10.5)

## 2020-05-14 LAB — LIPID PANEL
Cholesterol: 93 mg/dL (ref 0–200)
HDL: 29.9 mg/dL — ABNORMAL LOW (ref >39.00)
LDL Cholesterol: 27 mg/dL (ref 0–99)
NonHDL: 63.27
Total CHOL/HDL Ratio: 3
Triglycerides: 181 mg/dL — ABNORMAL HIGH (ref 0.0–149.0)
VLDL: 36.2 mg/dL (ref 0.0–40.0)

## 2020-05-14 LAB — TSH: TSH: 1.64 u[IU]/mL (ref 0.35–4.50)

## 2020-05-14 MED ORDER — NEOMYCIN-POLYMYXIN-HC 3.5-10000-1 OT SOLN: 3.0000 [drp] | Freq: Four times a day (QID) | OTIC | 0 refills | Status: DC

## 2020-05-14 NOTE — Progress Notes (Signed)
Chief Complaint  Patient presents with  . Follow-up  . Ear Problem    left ear pain and irritation    F/u  1. Left ear pain he is using ear flush at home and having left ear pain and soreness x few days to 1 week  2. DM 2 on jardiance 25 mg qd, metformin xr 500, rybelsus 14 mg qd zetia 10 and vascepa 2 mg bid  cbgs in the am 120 normally but had some diet choices 05/13/20 which cbg this am was 160  3. Low T stopped testosterone b/c he did not notice a difference  4. hTN on lis 2.5 mg qd BP controlled and on lopressor 50 mg bid    Review of Systems  Constitutional: Positive for weight loss.       +down 15 lbs trying diet change  HENT: Negative for hearing loss.   Eyes: Negative for blurred vision.  Respiratory: Negative for shortness of breath.   Cardiovascular: Negative for chest pain.  Gastrointestinal: Negative for abdominal pain.  Skin: Negative for rash.  Neurological: Negative for headaches.  Psychiatric/Behavioral: Negative for depression.   Past Medical History:  Diagnosis Date  . Abnormal CT of the abdomen   . Anemia    decreased platelets in last blood work  . 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 CAD (coronary artery disease)    s/p MI  . CAD (coronary artery disease)    s/p stent x 1 2004   . Cancer (HCC)    spindle cell stomach   . 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 Gastric mass 04/12/2015   cancer; resected  . History of kidney stones   . Hypercholesterolemia   . Hypertension   . Myocardial infarction ([place]) 2004  . Personal history of tobacco use, presenting hazards to health 07/13/2015  . Pneumonia   . Sleep apnea    CPAP  . 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 Vertigo    none in approx 1 yr  . Wears dentures    partial upper  . Wears hearing aid in both ears    Past Surgical History:  Procedure Laterality Date  . BACK SURGERY     01/29/18 [facility] lower  back   . CARDIAC CATHETERIZATION  2004   1 stent  . CARPAL TUNNEL  RELEASE Bilateral   . CATARACT EXTRACTION W/PHACO Left 06/04/2019   Procedure: CATARACT EXTRACTION PHACO AND INTRAOCULAR LENS PLACEMENT (IOC) LEFT DIABETIC 01:08.3  18.6%  12.76;  Surgeon: Leandrew Koyanagi, MD;  Location: [facility];  Service: Ophthalmology;  Laterality: Left;  diabetic - oral meds sleep apnea  . CATARACT EXTRACTION W/PHACO Right 07/02/2019   Procedure: CATARACT EXTRACTION PHACO AND INTRAOCULAR LENS PLACEMENT ([place]) Right DIABETIC;  Surgeon: Leandrew Koyanagi, MD;  Location: [facility];  Service: Ophthalmology;  Laterality: Right;  diabetic-oral med sleep apnea (CPAP)  . COLONOSCOPY WITH PROPOFOL N/A 03/26/2015   Procedure: COLONOSCOPY WITH PROPOFOL;  Surgeon: Josefine Class, MD;  Location: [facility];  Service: Endoscopy;  Laterality: N/A;  . KNEE SURGERY     TKR  . KNEE SURGERY     b/l partial knee Dr. Leanna Sato   . PARTIAL GASTRECTOMY     2016 [facility]   Family History  Problem Relation Age of Onset  . Diabetes Mellitus II Maternal Grandfather   . Heart disease Maternal Grandfather   . CAD Paternal Grandfather   . Cancer Paternal  Grandfather        ? type  . Arthritis Mother   . Depression Mother   . Hearing loss Mother   . Diabetes Father   . Cancer Brother        throat smoker   . Hearing loss Maternal Grandmother   . Arthritis Maternal Grandmother    Social History   Socioeconomic History  . Marital status: Married    Spouse name: Not on file  . Number of children: Not on file  . Years of education: Not on file  . Highest education level: Not on file  Occupational History  . Not on file  Tobacco Use  . Smoking status: Current Every Day Smoker    Packs/day: 0.50    Years: 50.00    Pack years: 25.00    Types: Cigarettes  . Smokeless tobacco: Never Used  . Tobacco comment: 1 ppd or a little less (since age 45)  Vaping Use  . Vaping Use: Former  Substance and Sexual Activity  . Alcohol use: No    Comment: may have  drink 3-4x/yr  . Drug use: No  . Sexual activity: Yes    Birth control/protection: None  Other Topics Concern  . 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 Difficulty of Paying Living Expenses: Somewhat hard  Food Insecurity:   . Worried About Charity fundraiser in the Last Year: Not on file  . Ran Out of Food in the Last Year: Not on file  Transportation Needs:   . Lack of Transportation (Medical): Not on file  . Lack of Transportation (Non-Medical): Not on file  Physical Activity:   . Days of Exercise per Week: Not on file  . Minutes of Exercise per Session: Not on file  Stress:   . Feeling of Stress : Not on file  Social Connections:   . Frequency of Communication with Friends and Family: Not on file  . Frequency of Social Gatherings with Friends and Family: Not on file  . Attends Religious Services: Not on file  . Active Member of Clubs or Organizations: Not on file  . Attends Archivist Meetings: Not on file  . Marital Status: Not on file  Intimate Partner Violence:   . Fear of Current or Ex-Partner: Not on file  . Emotionally Abused: Not on file  . Physically Abused: Not on file  . Sexually Abused: Not on file   Current Meds  Medication Sig  . aspirin 81 MG tablet Take 81 mg by mouth daily.  . cholecalciferol (VITAMIN D) 25 MCG (1000 UNIT) tablet Take 1,000 Units by mouth daily.   . clopidogrel (PLAVIX) 75 MG tablet Take 75 mg by mouth daily.  . empagliflozin (JARDIANCE) 25 MG TABS tablet Take 1 tablet (25 mg total) by mouth daily before breakfast.  . ezetimibe (ZETIA) 10 MG tablet Take 1 tablet (10 mg total) by mouth daily.  Marland Kitchen icosapent Ethyl (VASCEPA) 1 g capsule Take 2 capsules  (2 g total) by mouth 2 (two) times daily.  Marland Kitchen lisinopril (ZESTRIL) 2.5 MG  tablet Take 1 tablet (2.5 mg total) by mouth daily.  Marland Kitchen MAGNESIUM PO Take by mouth daily.  . meclizine (ANTIVERT) 25 MG tablet Take 25 mg by mouth 3 (three) times daily as needed for dizziness.  . metFORMIN (GLUCOPHAGE XR) 500 MG 24 hr tablet Take 2 tablets (1,000 mg total) by mouth in the morning and at bedtime.  . metoprolol tartrate (LOPRESSOR) 50 MG tablet Take 1 tablet (50 mg total) by mouth 2 (two) times daily.  . Probiotic Product (PROBIOTIC DAILY PO) Take by mouth.  . rosuvastatin (CRESTOR) 40 MG tablet Take 1 tablet (40 mg total) by mouth at bedtime.  . Semaglutide (RYBELSUS) 14 MG TABS Take 14 mg by mouth daily. Take after completing 7 mg x 30 days  . Testosterone 20.25 MG/ACT (1.62%) GEL Apply 1 pump to each shoulder daily  . vitamin B-12 (CYANOCOBALAMIN) 500 MCG tablet Take 5,000 mcg by mouth daily.    Allergies  Allergen Reactions  . Ibuprofen Itching and Rash   Recent Results (from the past 2160 hour(s))  HM DIABETES EYE EXAM     Status: None   Collection Time: 02/16/20 12:00 AM  Result Value Ref Range   HM Diabetic Eye Exam No Retinopathy No Retinopathy    Comment: [facility] Dr. Wallace Going   Objective  Body mass index is 31.38 kg/m. Wt Readings from Last 3 Encounters:  05/14/20 237 lb 12.8 oz (107.9 kg)  01/27/20 252 lb 6.4 oz (114.5 kg)  12/10/19 250 lb (113.4 kg)   Temp Readings from Last 3 Encounters:  05/14/20 98.3 F (36.8 C) (Oral)  01/27/20 98 F (36.7 C)  12/10/19 98 F (36.7 C) (Oral)   BP Readings from Last 3 Encounters:  05/14/20 124/68  01/27/20 126/68  12/10/19 (!) 114/56   Pulse Readings from Last 3 Encounters:  05/14/20 87  01/27/20 90  12/10/19 85    Physical Exam Vitals and nursing note reviewed.  Constitutional:      Appearance: Normal appearance. He is well-developed and well-groomed. He is obese.  HENT:     Head: Normocephalic and atraumatic.     Ears:      Comments: Left ear with irritation internally   Eyes:     Conjunctiva/sclera: Conjunctivae normal.     Pupils: Pupils are equal, round, and reactive to light.  Cardiovascular:     Rate and Rhythm: Normal rate and regular rhythm.     Heart sounds: Normal heart sounds. No murmur heard.   Pulmonary:     Effort: Pulmonary effort is normal.     Breath sounds: Normal breath sounds.  Skin:    General: Skin is warm and dry.  Neurological:     General: No focal deficit present.     Mental Status: He is alert and oriented to person, place, and time. Mental status is at baseline.     Gait: Gait normal.  Psychiatric:        Attention and Perception: Attention and perception normal.        Mood and Affect: Mood and affect normal.        Speech: Speech normal.        Behavior: Behavior normal. Behavior is cooperative.        Thought Content: Thought content normal.        Cognition and Memory: Cognition and memory normal.        Judgment: Judgment normal.     Assessment  Plan  Hypertension associated with diabetes ([place]) - Plan:  Comprehensive metabolic panel, Lipid panel, CBC with Differential/Platelet, Hemoglobin A1c Cont meds lis 2.5 mg qd, lopressor 50 mg bid  On zetia 10, vascepa 2 mg bid Metformin xr 500 mg qd  jardiance 25 mg qd  crestor 40 mg qd   Thrombocytopenia (HCC) - Plan: CBC with Differential/Platelet  Left ear pain - Plan: neomycin-polymyxin-hydrocortisone (CORTISPORIN) OTIC solution  Low testosterone in male  Stopped testosterone was not helping   HM Flu shot had at work 05/08/18 Declines 2021 flu shot Tdap and shingles [facility] -Tdap no recently recordper pt had in 2016 or 2017in ED prevnar utd  pna 23 utd covid 83 declines pfizer  Consider shingrix  rec hepA/hep Bpt declines h/o fatty liver MMR immune  H/o colon polyps 03/26/15 polys x 3-4 tubular and hyperplastic repeat in 3-5 years [facility]  03/25/20 [facility]   PSA normal0.29 06/17/2019  H/o lung nodules consider ct chest in future  -smoker age 81 to present max 1.5 ppd now <1 ppd no FH lung cancer rec cessation -CT chest 10/24/19  IMPRESSION: 1. Lung-RADS 2, benign appearance or behavior. Continue annual screening with low-dose chest CT without contrast in 12 months. 2. Hepatic steatosis. 3. Emphysema (ICD10-J43.9) and Aortic Atherosclerosis (ICD10-170.0) REC SMOKING CESSATION  Consider dermatology in the futurebut currently no need as of 05/14/20  H/o [facility] appt 6/2021H/O SPINDLE CELL CA/GIST TUMOR STOMACH f/u 06/2020 and if normal then yearly    Provider: Dr. [name]-Internal Medicine

## 2020-05-14 NOTE — Patient Instructions (Signed)
Shoulder Range of Motion Exercises Shoulder range of motion (ROM) exercises are done to keep the shoulder moving freely or to increase movement. They are often recommended for people who have shoulder pain or stiffness or who are recovering from a shoulder surgery. Phase 1 exercises When you are able, do this exercise 1-2 times per day for 30-60 seconds in each direction, or as directed by your health care provider. Pendulum exercise To do this exercise while sitting: 1. Sit in a chair or at the edge of your bed with your feet flat on the floor. 2. Let your affected arm hang down in front of you over the edge of the bed or chair. 3. Relax your shoulder, arm, and hand. 4. Rock your body so your arm gently swings in small circles. You can also use your unaffected arm to start the motion. 5. Repeat changing the direction of the circles, swinging your arm left and right, and swinging your arm forward and back. To do this exercise while standing: 1. Stand next to a sturdy chair or table, and hold on to it with your hand on your unaffected side. 2. Bend forward at the waist. 3. Bend your knees slightly. 4. Relax your shoulder, arm, and hand. 5. While keeping your shoulder relaxed, use body motion to swing your arm in small circles. 6. Repeat changing the direction of the circles, swinging your arm left and right, and swinging your arm forward and back. 7. Between exercises, stand up tall and take a short break to relax your lower back.  Phase 2 exercises Do these exercises 1-2 times per day or as told by your health care provider. Hold each stretch for 30 seconds, and repeat 3 times. Do the exercises with one or both arms as instructed by your health care provider. For these exercises, sit at a table with your hand and arm supported by the table. A chair that slides easily or has wheels can be helpful. External rotation 1. Turn your chair so that your affected side is nearest to the  table. 2. Place your forearm on the table to your side. Bend your elbow about 90 at the elbow (right angle) and place your hand palm facing down on the table. Your elbow should be about 6 inches away from your side. 3. Keeping your arm on the table, lean your body forward. Abduction 1. Turn your chair so that your affected side is nearest to the table. 2. Place your forearm and hand on the table so that your thumb points toward the ceiling and your arm is straight out to your side. 3. Slide your hand out to the side and away from you, using your unaffected arm to do the work. 4. To increase the stretch, you can slide your chair away from the table. Flexion: forward stretch 1. Sit facing the table. Place your hand and elbow on the table in front of you. 2. Slide your hand forward and away from you, using your unaffected arm to do the work. 3. To increase the stretch, you can slide your chair backward. Phase 3 exercises Do these exercises 1-2 times per day or as told by your health care provider. Hold each stretch for 30 seconds, and repeat 3 times. Do the exercises with one or both arms as instructed by your health care provider. Cross-body stretch: posterior capsule stretch 1. Lift your arm straight out in front of you. 2. Bend your arm 90 at the elbow (right angle) so your forearm   moves across your body. 3. Use your other arm to gently pull the elbow across your body, toward your other shoulder. Wall climbs 1. Stand with your affected arm extended out to the side with your hand resting on a door frame. 2. Slide your hand slowly up the door frame. 3. To increase the stretch, step through the door frame. Keep your body upright and do not lean. Wand exercises You will need a cane, a piece of PVC pipe, or a sturdy wooden dowel for wand exercises. Flexion To do this exercise while standing: 1. Hold the wand with both of your hands, palms down. 2. Using the other arm to help, lift your arms  up and over your head, if able. 3. Push upward with your other arm to gently increase the stretch. To do this exercise while lying down: 1. Lie on your back with your elbows resting on the floor and the wand in both your hands. Your hands will be palm down, or pointing toward your feet. 2. Lift your hands toward the ceiling, using your unaffected arm to help if needed. 3. Bring your arms overhead as able, using your unaffected arm to help if needed. Internal rotation 1. Stand while holding the wand behind you with both hands. Your unaffected arm should be extended above your head with the arm of the affected side extended behind you at the level of your waist. The wand should be pointing straight up and down as you hold it. 2. Slowly pull the wand up behind your back by straightening the elbow of your unaffected arm and bending the elbow of your affected arm. External rotation 1. Lie on your back with your affected upper arm supported on a small pillow or rolled towel. When you first do this exercise, keep your upper arm close to your body. Over time, bring your arm up to a 90 angle out to the side. 2. Hold the wand across your stomach and with both hands palm up. Your elbow on your affected side should be bent at a 90 angle. 3. Use your unaffected side to help push your forearm away from you and toward the floor. Keep your elbow on your affected side bent at a 90 angle. Contact a health care provider if you have:  New or increasing pain.  New numbness, tingling, weakness, or discoloration in your arm or hand. This information is not intended to replace advice given to you by your health care provider. Make sure you discuss any questions you have with your health care provider. Document Revised: 08/29/2017 Document Reviewed: 08/29/2017 Elsevier Patient Education  [place].  Shoulder Exercises Ask your health care provider which exercises are safe for you. Do exercises exactly as  told by your health care provider and adjust them as directed. It is normal to feel mild stretching, pulling, tightness, or discomfort as you do these exercises. Stop right away if you feel sudden pain or your pain gets worse. Do not begin these exercises until told by your health care provider. Stretching exercises External rotation and abduction This exercise is sometimes called corner stretch. This exercise rotates your arm outward (external rotation) and moves your arm out from your body (abduction). 1. Stand in a doorway with one of your feet slightly in front of the other. This is called a staggered stance. If you cannot reach your forearms to the door frame, stand facing a corner of a room. 2. Choose one of the following positions as told by  your health care provider: ? Place your hands and forearms on the door frame above your head. ? Place your hands and forearms on the door frame at the height of your head. ? Place your hands on the door frame at the height of your elbows. 3. Slowly move your weight onto your front foot until you feel a stretch across your chest and in the front of your shoulders. Keep your head and chest upright and keep your abdominal muscles tight. 4. Hold for __________ seconds. 5. To release the stretch, shift your weight to your back foot. Repeat __________ times. Complete this exercise __________ times a day. Extension, standing 1. Stand and hold a broomstick, a cane, or a similar object behind your back. ? Your hands should be a little wider than shoulder width apart. ? Your palms should face away from your back. 2. Keeping your elbows straight and your shoulder muscles relaxed, move the stick away from your body until you feel a stretch in your shoulders (extension). ? Avoid shrugging your shoulders while you move the stick. Keep your shoulder blades tucked down toward the middle of your back. 3. Hold for __________ seconds. 4. Slowly return to the starting  position. Repeat __________ times. Complete this exercise __________ times a day. Range-of-motion exercises Pendulum  1. Stand near a wall or a surface that you can hold onto for balance. 2. Bend at the waist and let your left / right arm hang straight down. Use your other arm to support you. Keep your back straight and do not lock your knees. 3. Relax your left / right arm and shoulder muscles, and move your hips and your trunk so your left / right arm swings freely. Your arm should swing because of the motion of your body, not because you are using your arm or shoulder muscles. 4. Keep moving your hips and trunk so your arm swings in the following directions, as told by your health care provider: ? Side to side. ? Forward and backward. ? In clockwise and counterclockwise circles. 5. Continue each motion for __________ seconds, or for as long as told by your health care provider. 6. Slowly return to the starting position. Repeat __________ times. Complete this exercise __________ times a day. Shoulder flexion, standing  1. Stand and hold a broomstick, a cane, or a similar object. Place your hands a little more than shoulder width apart on the object. Your left / right hand should be palm up, and your other hand should be palm down. 2. Keep your elbow straight and your shoulder muscles relaxed. Push the stick up with your healthy arm to raise your left / right arm in front of your body, and then over your head until you feel a stretch in your shoulder (flexion). ? Avoid shrugging your shoulder while you raise your arm. Keep your shoulder blade tucked down toward the middle of your back. 3. Hold for __________ seconds. 4. Slowly return to the starting position. Repeat __________ times. Complete this exercise __________ times a day. Shoulder abduction, standing 1. Stand and hold a broomstick, a cane, or a similar object. Place your hands a little more than shoulder width apart on the object.  Your left / right hand should be palm up, and your other hand should be palm down. 2. Keep your elbow straight and your shoulder muscles relaxed. Push the object across your body toward your left / right side. Raise your left / right arm to the side of your  body (abduction) until you feel a stretch in your shoulder. ? Do not raise your arm above shoulder height unless your health care provider tells you to do that. ? If directed, raise your arm over your head. ? Avoid shrugging your shoulder while you raise your arm. Keep your shoulder blade tucked down toward the middle of your back. 3. Hold for __________ seconds. 4. Slowly return to the starting position. Repeat __________ times. Complete this exercise __________ times a day. Internal rotation  1. Place your left / right hand behind your back, palm up. 2. Use your other hand to dangle an exercise band, a towel, or a similar object over your shoulder. Grasp the band with your left / right hand so you are holding on to both ends. 3. Gently pull up on the band until you feel a stretch in the front of your left / right shoulder. The movement of your arm toward the center of your body is called internal rotation. ? Avoid shrugging your shoulder while you raise your arm. Keep your shoulder blade tucked down toward the middle of your back. 4. Hold for __________ seconds. 5. Release the stretch by letting go of the band and lowering your hands. Repeat __________ times. Complete this exercise __________ times a day. Strengthening exercises External rotation  1. Sit in a stable chair without armrests. 2. Secure an exercise band to a stable object at elbow height on your left / right side. 3. Place a soft object, such as a folded towel or a small pillow, between your left / right upper arm and your body to move your elbow about 4 inches (10 cm) away from your side. 4. Hold the end of the exercise band so it is tight and there is no slack. 5. Keeping  your elbow pressed against the soft object, slowly move your forearm out, away from your abdomen (external rotation). Keep your body steady so only your forearm moves. 6. Hold for __________ seconds. 7. Slowly return to the starting position. Repeat __________ times. Complete this exercise __________ times a day. Shoulder abduction  1. Sit in a stable chair without armrests, or stand up. 2. Hold a __________ weight in your left / right hand, or hold an exercise band with both hands. 3. Start with your arms straight down and your left / right palm facing in, toward your body. 4. Slowly lift your left / right hand out to your side (abduction). Do not lift your hand above shoulder height unless your health care provider tells you that this is safe. ? Keep your arms straight. ? Avoid shrugging your shoulder while you do this movement. Keep your shoulder blade tucked down toward the middle of your back. 5. Hold for __________ seconds. 6. Slowly lower your arm, and return to the starting position. Repeat __________ times. Complete this exercise __________ times a day. Shoulder extension 1. Sit in a stable chair without armrests, or stand up. 2. Secure an exercise band to a stable object in front of you so it is at shoulder height. 3. Hold one end of the exercise band in each hand. Your palms should face each other. 4. Straighten your elbows and lift your hands up to shoulder height. 5. Step back, away from the secured end of the exercise band, until the band is tight and there is no slack. 6. Squeeze your shoulder blades together as you pull your hands down to the sides of your thighs (extension). Stop when your hands are  straight down by your sides. Do not let your hands go behind your body. 7. Hold for __________ seconds. 8. Slowly return to the starting position. Repeat __________ times. Complete this exercise __________ times a day. Shoulder row 1. Sit in a stable chair without armrests, or  stand up. 2. Secure an exercise band to a stable object in front of you so it is at waist height. 3. Hold one end of the exercise band in each hand. Position your palms so that your thumbs are facing the ceiling (neutral position). 4. Bend each of your elbows to a 90-degree angle (right angle) and keep your upper arms at your sides. 5. Step back until the band is tight and there is no slack. 6. Slowly pull your elbows back behind you. 7. Hold for __________ seconds. 8. Slowly return to the starting position. Repeat __________ times. Complete this exercise __________ times a day. Shoulder press-ups  1. Sit in a stable chair that has armrests. Sit upright, with your feet flat on the floor. 2. Put your hands on the armrests so your elbows are bent and your fingers are pointing forward. Your hands should be about even with the sides of your body. 3. Push down on the armrests and use your arms to lift yourself off the chair. Straighten your elbows and lift yourself up as much as you comfortably can. ? Move your shoulder blades down, and avoid letting your shoulders move up toward your ears. ? Keep your feet on the ground. As you get stronger, your feet should support less of your body weight as you lift yourself up. 4. Hold for __________ seconds. 5. Slowly lower yourself back into the chair. Repeat __________ times. Complete this exercise __________ times a day. Wall push-ups  1. Stand so you are facing a stable wall. Your feet should be about one arm-length away from the wall. 2. Lean forward and place your palms on the wall at shoulder height. 3. Keep your feet flat on the floor as you bend your elbows and lean forward toward the wall. 4. Hold for __________ seconds. 5. Straighten your elbows to push yourself back to the starting position. Repeat __________ times. Complete this exercise __________ times a day. This information is not intended to replace advice given to you by your health  care provider. Make sure you discuss any questions you have with your health care provider. Document Revised: 11/08/2018 Document Reviewed: 08/16/2018 Elsevier Patient Education  [place].

## 2020-05-17 ENCOUNTER — Telehealth: Payer: Self-pay | Admitting: Internal Medicine

## 2020-05-17 ENCOUNTER — Encounter: Payer: Self-pay | Admitting: *Deleted

## 2020-05-17 NOTE — Telephone Encounter (Signed)
Faxed to [employer] patient assistance program refill/reorder request for rybelsus 14 mg take by mouth daily faxed on 05-17-20

## 2020-05-25 ENCOUNTER — Ambulatory Visit: Payer: Medicare Other | Admitting: Pharmacist

## 2020-05-25 ENCOUNTER — Other Ambulatory Visit: Payer: Self-pay | Admitting: Pharmacy Technician

## 2020-05-25 DIAGNOSIS — I2510 Atherosclerotic heart disease of native coronary artery without angina pectoris: Secondary | ICD-10-CM

## 2020-05-25 DIAGNOSIS — E1159 Type 2 diabetes mellitus with other circulatory complications: Secondary | ICD-10-CM

## 2020-05-25 DIAGNOSIS — I152 Hypertension secondary to endocrine disorders: Secondary | ICD-10-CM | POA: Diagnosis not present

## 2020-05-25 DIAGNOSIS — E1165 Type 2 diabetes mellitus with hyperglycemia: Secondary | ICD-10-CM

## 2020-05-25 DIAGNOSIS — E782 Mixed hyperlipidemia: Secondary | ICD-10-CM | POA: Diagnosis not present

## 2020-05-25 NOTE — Patient Outreach (Signed)
[facility]) Care Management  05/25/2020  Kevin Ryan Sep 23, 1950 [id number]  Received in basket message from embedded [place] inquiring if I would [employer] in regards to patient's refill request for Rybelsus. Patient had received a letter stating the application could not be processed.  Care coordination call placed to [employer].  Spoke to [place] who informed the phone number and state license number I provided to her on behalf of the provider was not what was written on the form. If the information is incorrect on the form then the form would have to be refaxed.  Care coordination call placed to embedded [place] to update her on this information.  Kevin Ryan, [name]  [phone]

## 2020-05-25 NOTE — Patient Instructions (Signed)
Visit Information  Goals Addressed              This Visit's Progress     Patient Stated   .  PharmD "I can't afford this medication" (pt-stated)        CARE PLAN ENTRY (see longtitudinal plan of care for additional care plan information)  Current Barriers:  . Social, community, and financial barriers: o Reports he has been taking care of his grandkids recently. Really enjoys that.  o Pleased with ~15 lbs weight loss recently.  o Brings paperwork from [employer] regarding Rybelsus refill not being processed.  . Diabetes: uncontrolled; complicated by chronic medical conditions including CAD, HTN, HLD, fatty liver, most recent A1c 7.7% . Most recent eGFR: ~85 mL/min . Current antihyperglycemic regimen: metformin XR 1000 mg BID, Jardiance 25 mg, Rybelsus 14 mg daily o APPROVED for assistance through [employer] for Rybelsus through 06/29/20 o APPROVED for assistance through [facility] for Jardiance through 07/30/20 . Cardiovascular risk reduction: o Current hypertensive regimen: lisinopril 2.5 mg QAM, metoprolol tartrate 50 mg BID; last clinic BP at goal o Current hyperlipidemia regimen: ezetimibe 10 mg daily, rosuvastatin 20 mg daily, Vascepa 2 g BID; most recent lipid panel at goal, LDL 27, TG 181 - APPROVED for [employer] funding for Vascepa o Current antiplatelet regimen: ASA 81, clopidogrel 75 mg daily; s/p MI in 2014. Unsure if DAPT is still necessary, patient follows w/ cardiology Dr. Saralyn Pilar  Pharmacist Clinical Goal(s):  [place] Over the next 90 days, patient will work with PharmD and primary care provider to address optimized medication management  Interventions: . Comprehensive medication review performed, medication list updated in electronic medical record . Inter-disciplinary care team collaboration (see longitudinal plan of care) . Will collaborate w/ [name] to follow up with [facility] regarding refill request for Rybelsus 14 mg. Unsure if updated refill request form is needed. Will  collaborate w/ PCP and CMA if so.  . Collected patient portion of reapplications for [employer] (Rybelsus) and [employer] (Jardiance) for 2022. Will collaborate w/ PCP and [name] for reapplication.   Patient Self Care Activities:  . Patient will check blood glucose daily, document, and provide at future appointments . Patient will take medications as prescribed . Patient will report any questions or concerns to provider   Please see past updates related to this goal by clicking on the "Past Updates" button in the selected goal         The patient verbalized understanding of instructions provided today and declined a print copy of patient instruction materials.    Plan:  - Scheduled f/u call in ~ 12 weeks  [name], PharmD, [place], [place] Pharmacist [place] [phone]

## 2020-05-25 NOTE — Chronic Care Management (AMB) (Signed)
Chronic Care Management   Follow Up Note   05/25/2020 Name: KRYSTLE OBERMAN MRN: [id number] DOB: August 14, 1950  Referred by: McLean-Scocuzza, Nino Glow, MD Reason for referral : Chronic Care Management (Medication Management)   FADI MENTER is a 69 y.o. year old male who is a primary care patient of McLean-Scocuzza, Nino Glow, MD. The CCM team was consulted for assistance with chronic disease management and care coordination needs.    Patient came by clinic today w/ [place] paperwork  Review of patient status, including review of consultants reports, relevant laboratory and other test results, and collaboration with appropriate care team members and the patient's provider was performed as part of comprehensive patient evaluation and provision of chronic care management services.    SDOH (Social Determinants of Health) assessments performed: Yes See Care Plan activities for detailed interventions related to [facility])     Outpatient Encounter Medications as of 05/25/2020  Medication Sig   aspirin 81 MG tablet Take 81 mg by mouth daily.   cholecalciferol (VITAMIN D) 25 MCG (1000 UNIT) tablet Take 1,000 Units by mouth daily.    clopidogrel (PLAVIX) 75 MG tablet Take 75 mg by mouth daily.   empagliflozin (JARDIANCE) 25 MG TABS tablet Take 1 tablet (25 mg total) by mouth daily before breakfast.   ezetimibe (ZETIA) 10 MG tablet Take 1 tablet (10 mg total) by mouth daily.   icosapent Ethyl (VASCEPA) 1 g capsule Take 2 capsules (2 g total) by mouth 2 (two) times daily.   lisinopril (ZESTRIL) 2.5 MG tablet Take 1 tablet (2.5 mg total) by mouth daily.   MAGNESIUM PO Take by mouth daily.   meclizine (ANTIVERT) 25 MG tablet Take 25 mg by mouth 3 (three) times daily as needed for dizziness.   metFORMIN (GLUCOPHAGE XR) 500 MG 24 hr tablet Take 2 tablets (1,000 mg total) by mouth in the morning and at bedtime.   metoprolol tartrate (LOPRESSOR) 50 MG tablet Take 1 tablet (50 mg total) by mouth  2 (two) times daily.   neomycin-polymyxin-hydrocortisone (CORTISPORIN) OTIC solution Place 3 drops into the left ear 4 (four) times daily. X up to 1 week prn left ear   Probiotic Product (PROBIOTIC DAILY PO) Take by mouth.   rosuvastatin (CRESTOR) 40 MG tablet Take 1 tablet (40 mg total) by mouth at bedtime.   Semaglutide (RYBELSUS) 14 MG TABS Take 14 mg by mouth daily. Take after completing 7 mg x 30 days   vitamin B-12 (CYANOCOBALAMIN) 500 MCG tablet Take 5,000 mcg by mouth daily.    No facility-administered encounter medications on file as of 05/25/2020.     Objective:   Goals Addressed              This Visit's Progress     Patient Stated     PharmD "I can't afford this medication" (pt-stated)        CARE PLAN ENTRY (see longtitudinal plan of care for additional care plan information)  Current Barriers:   Social, community, and financial barriers: o Reports he has been taking care of his grandkids recently. Really enjoys that.  o Pleased with ~15 lbs weight loss recently.  o Brings paperwork from [employer] regarding Rybelsus refill not being processed.   Diabetes: uncontrolled; complicated by chronic medical conditions including CAD, HTN, HLD, fatty liver, most recent A1c 7.7%  Most recent eGFR: ~85 mL/min  Current antihyperglycemic regimen: metformin XR 1000 mg BID, Jardiance 25 mg, Rybelsus 14 mg daily o APPROVED for assistance through  [place] for Rybelsus through 06/29/20 o APPROVED for assistance through [facility] for Jardiance through 07/30/20  Cardiovascular risk reduction: o Current hypertensive regimen: lisinopril 2.5 mg QAM, metoprolol tartrate 50 mg BID; last clinic BP at goal o Current hyperlipidemia regimen: ezetimibe 10 mg daily, rosuvastatin 20 mg daily, Vascepa 2 g BID; most recent lipid panel at goal, LDL 27, TG 181 - APPROVED for [employer] funding for Vascepa o Current antiplatelet regimen: ASA 81, clopidogrel 75 mg daily; s/p MI in 2014.  Unsure if DAPT is still necessary, patient follows w/ cardiology Dr. Saralyn Pilar  Pharmacist Clinical Goal(s):   Over the next 90 days, patient will work with PharmD and primary care provider to address optimized medication management  Interventions:  Comprehensive medication review performed, medication list updated in electronic medical record  Inter-disciplinary care team collaboration (see longitudinal plan of care)  Will collaborate w/ [name] to follow up with [facility] regarding refill request for Rybelsus 14 mg. Unsure if updated refill request form is needed. Will collaborate w/ PCP and CMA if so.   Collected patient portion of reapplications for [employer] (Rybelsus) and [employer] (Jardiance) for 2022. Will collaborate w/ PCP and [name] for reapplication.   Patient Self Care Activities:   Patient will check blood glucose daily, document, and provide at future appointments  Patient will take medications as prescribed  Patient will report any questions or concerns to provider   Please see past updates related to this goal by clicking on the "Past Updates" button in the selected goal          Plan:  - Scheduled f/u call in ~ 12 weeks  [name], PharmD, [place], [place] Pharmacist [place] [phone]

## 2020-05-31 ENCOUNTER — Telehealth: Payer: Medicare Other

## 2020-06-01 ENCOUNTER — Ambulatory Visit: Payer: Medicare Other | Admitting: Pharmacist

## 2020-06-01 DIAGNOSIS — E1165 Type 2 diabetes mellitus with hyperglycemia: Secondary | ICD-10-CM

## 2020-06-01 DIAGNOSIS — E782 Mixed hyperlipidemia: Secondary | ICD-10-CM

## 2020-06-01 DIAGNOSIS — I2510 Atherosclerotic heart disease of native coronary artery without angina pectoris: Secondary | ICD-10-CM

## 2020-06-01 DIAGNOSIS — E1159 Type 2 diabetes mellitus with other circulatory complications: Secondary | ICD-10-CM

## 2020-06-01 DIAGNOSIS — I152 Hypertension secondary to endocrine disorders: Secondary | ICD-10-CM

## 2020-06-03 ENCOUNTER — Telehealth: Payer: Self-pay | Admitting: Internal Medicine

## 2020-06-03 NOTE — Telephone Encounter (Signed)
Faxed request for [facility]'s order to [facility] faxed on 06-03-2020

## 2020-06-03 NOTE — Patient Instructions (Signed)
Visit Information  Goals Addressed              This Visit's Progress     Patient Stated   .  PharmD "I can't afford this medication" (pt-stated)        CARE PLAN ENTRY (see longtitudinal plan of care for additional care plan information)  Current Barriers:  . Social, community, and financial barriers: o Working on refill for [employer]  . Diabetes: uncontrolled; complicated by chronic medical conditions including CAD, HTN, HLD, fatty liver, most recent A1c 7.7% . Most recent eGFR: ~85 mL/min . Current antihyperglycemic regimen: metformin XR 1000 mg BID, Jardiance 25 mg, Rybelsus 14 mg daily. Does note decreased appetite and occasional nausea w/ Rybelsus. Denies wanting to decrease or discontinue therapy, denies that it is negatively impactful on his quality of life. o APPROVED for assistance through [employer] for Rybelsus through 06/29/20 o APPROVED for assistance through [facility] for Jardiance through 07/30/20 . Current glucose readings:  o Fastings: 14 day average of 114 . Current meal patterns: does report that he has lost ~16 lbs through carbohydrate reduction. Notes they went to his favorite steakhouse the other day and he only had steak and a salad, he did not eat bread or potatoes. . Cardiovascular risk reduction: o Current hypertensive regimen: lisinopril 2.5 mg QAM, metoprolol tartrate 50 mg BID; last clinic BP at goal o Current hyperlipidemia regimen: ezetimibe 10 mg daily, rosuvastatin 20 mg daily, Vascepa 2 g BID; most recent lipid panel at goal, LDL 27, TG 181 - APPROVED for [employer] funding for Vascepa o Current antiplatelet regimen: ASA 81, clopidogrel 75 mg daily; s/p MI in 2014. Unsure if DAPT is still necessary, patient follows w/ cardiology Dr. Saralyn Pilar  Pharmacist Clinical Goal(s):  Marland Kitchen Over the next 90 days, patient will work with PharmD and primary care provider to address optimized medication management  Interventions: . [employer]. Missed several  phone tag calls back and forth across the past 3 days. Finally connected with a representative. She noted that PCP state license number was incorrect on the refill paperwork, and we needed to resubmit this. Re-faxed in today. She notes that if patient gets to 2-3 days of therapy supply remaining, we can call back in for a voucher for a 30 day supply at his local pharmacy.  Marland Kitchen Contacted patient, reviewed the above. He notes that he has 14 tablets remaining. He will call me if he has 2 days of therapy remaining, and we will call in to [employer] for voucher.  . Reviewed glucose readings. Praised for continued maintenance of glucose readings and continued weight loss. Discussed that next A1c is expected to be improved.  . Collected patient and provider portions of [employer] (Jardiance) and [employer] (Rybelsus) reapplications for 0254. Collaborating w/ [name] for submission and f/u.   Patient Self Care Activities:  . Patient will check blood glucose daily, document, and provide at future appointments . Patient will take medications as prescribed . Patient will report any questions or concerns to provider   Please see past updates related to this goal by clicking on the "Past Updates" button in the selected goal         The patient verbalized understanding of instructions provided today and declined a print copy of patient instruction materials.    Plan:  - Will outreach in ~10 weeks as previously scheduled  [name], PharmD, [place], [place] Pharmacist [place] [phone]

## 2020-06-03 NOTE — Chronic Care Management (AMB) (Signed)
Chronic Care Management   Follow Up Note   06/03/2020 Name: Kevin Ryan MRN: [id number] DOB: September 06, 1950  Referred by: McLean-Scocuzza, Nino Glow, MD Reason for referral : Chronic Care Management (Medication Management)   Kevin Ryan is a 69 y.o. year old male who is a primary care patient of McLean-Scocuzza, Nino Glow, MD. The CCM team was consulted for assistance with chronic disease management and care coordination needs.    Care coordination completed today.   Review of patient status, including review of consultants reports, relevant laboratory and other test results, and collaboration with appropriate care team members and the patient's provider was performed as part of comprehensive patient evaluation and provision of chronic care management services.    SDOH (Social Determinants of Health) assessments performed: Yes See Care Plan activities for detailed interventions related to SDOH)  SDOH Interventions     Most Recent Value  SDOH Interventions  Financial Strain Interventions Other (Comment)  [manufacturer assistance]       Outpatient Encounter Medications as of 06/01/2020  Medication Sig  . aspirin 81 MG tablet Take 81 mg by mouth daily.  . cholecalciferol (VITAMIN D) 25 MCG (1000 UNIT) tablet Take 1,000 Units by mouth daily.   . clopidogrel (PLAVIX) 75 MG tablet Take 75 mg by mouth daily.  . empagliflozin (JARDIANCE) 25 MG TABS tablet Take 1 tablet (25 mg total) by mouth daily before breakfast.  . ezetimibe (ZETIA) 10 MG tablet Take 1 tablet (10 mg total) by mouth daily.  Marland Kitchen icosapent Ethyl (VASCEPA) 1 g capsule Take 2 capsules (2 g total) by mouth 2 (two) times daily.  Marland Kitchen lisinopril (ZESTRIL) 2.5 MG tablet Take 1 tablet (2.5 mg total) by mouth daily.  Marland Kitchen MAGNESIUM PO Take by mouth daily.  . meclizine (ANTIVERT) 25 MG tablet Take 25 mg by mouth 3 (three) times daily as needed for dizziness.  . metFORMIN (GLUCOPHAGE XR) 500 MG 24 hr tablet Take 2 tablets (1,000 mg total) by  mouth in the morning and at bedtime.  . metoprolol tartrate (LOPRESSOR) 50 MG tablet Take 1 tablet (50 mg total) by mouth 2 (two) times daily.  Marland Kitchen neomycin-polymyxin-hydrocortisone (CORTISPORIN) OTIC solution Place 3 drops into the left ear 4 (four) times daily. X up to 1 week prn left ear  . Probiotic Product (PROBIOTIC DAILY PO) Take by mouth.  . rosuvastatin (CRESTOR) 40 MG tablet Take 1 tablet (40 mg total) by mouth at bedtime.  . Semaglutide (RYBELSUS) 14 MG TABS Take 14 mg by mouth daily. Take after completing 7 mg x 30 days  . vitamin B-12 (CYANOCOBALAMIN) 500 MCG tablet Take 5,000 mcg by mouth daily.    No facility-administered encounter medications on file as of 06/01/2020.     Objective:   Goals Addressed              This Visit's Progress     Patient Stated   .  PharmD "I can't afford this medication" (pt-stated)        CARE PLAN ENTRY (see longtitudinal plan of care for additional care plan information)  Current Barriers:  . Social, community, and financial barriers: o Working on refill for [employer]  . Diabetes: uncontrolled; complicated by chronic medical conditions including CAD, HTN, HLD, fatty liver, most recent A1c 7.7% . Most recent eGFR: ~85 mL/min . Current antihyperglycemic regimen: metformin XR 1000 mg BID, Jardiance 25 mg, Rybelsus 14 mg daily. Does note decreased appetite and occasional nausea w/ Rybelsus. Denies wanting to decrease or discontinue  therapy, denies that it is negatively impactful on his quality of life. o APPROVED for assistance through [employer] for Rybelsus through 06/29/20 o APPROVED for assistance through [facility] for Jardiance through 07/30/20 . Current glucose readings:  o Fastings: 14 day average of 114 . Current meal patterns: does report that he has lost ~16 lbs through carbohydrate reduction. Notes they went to his favorite steakhouse the other day and he only had steak and a salad, he did not eat bread or potatoes. . Cardiovascular  risk reduction: o Current hypertensive regimen: lisinopril 2.5 mg QAM, metoprolol tartrate 50 mg BID; last clinic BP at goal o Current hyperlipidemia regimen: ezetimibe 10 mg daily, rosuvastatin 20 mg daily, Vascepa 2 g BID; most recent lipid panel at goal, LDL 27, TG 181 - APPROVED for [employer] funding for Vascepa o Current antiplatelet regimen: ASA 81, clopidogrel 75 mg daily; s/p MI in 2014. Unsure if DAPT is still necessary, patient follows w/ cardiology Dr. Saralyn Pilar  Pharmacist Clinical Goal(s):  Marland Kitchen Over the next 90 days, patient will work with PharmD and primary care provider to address optimized medication management  Interventions: . [employer]. Missed several phone tag calls back and forth across the past 3 days. Finally connected with a representative. She noted that PCP state license number was incorrect on the refill paperwork, and we needed to resubmit this. Re-faxed in today. She notes that if patient gets to 2-3 days of therapy supply remaining, we can call back in for a voucher for a 30 day supply at his local pharmacy.  Marland Kitchen Contacted patient, reviewed the above. He notes that he has 14 tablets remaining. He will call me if he has 2 days of therapy remaining, and we will call in to [employer] for voucher.  . Reviewed glucose readings. Praised for continued maintenance of glucose readings and continued weight loss. Discussed that next A1c is expected to be improved.  . Collected patient and provider portions of BI (Jardiance) and [employer] (Rybelsus) reapplications for 0037. Collaborating w/ [name] for submission and f/u.   Patient Self Care Activities:  . Patient will check blood glucose daily, document, and provide at future appointments . Patient will take medications as prescribed . Patient will report any questions or concerns to provider   Please see past updates related to this goal by clicking on the "Past Updates" button in the selected goal           Plan:  - Will outreach in ~10 weeks as previously scheduled  [name], PharmD, [place], [place] Pharmacist [place] [phone]

## 2020-06-14 ENCOUNTER — Other Ambulatory Visit: Payer: Self-pay | Admitting: Pharmacy Technician

## 2020-06-14 ENCOUNTER — Ambulatory Visit: Payer: Medicare Other | Admitting: Pharmacist

## 2020-06-14 DIAGNOSIS — E782 Mixed hyperlipidemia: Secondary | ICD-10-CM

## 2020-06-14 DIAGNOSIS — I152 Hypertension secondary to endocrine disorders: Secondary | ICD-10-CM

## 2020-06-14 DIAGNOSIS — E1159 Type 2 diabetes mellitus with other circulatory complications: Secondary | ICD-10-CM

## 2020-06-14 DIAGNOSIS — I2510 Atherosclerotic heart disease of native coronary artery without angina pectoris: Secondary | ICD-10-CM

## 2020-06-14 DIAGNOSIS — E1165 Type 2 diabetes mellitus with hyperglycemia: Secondary | ICD-10-CM

## 2020-06-14 MED ORDER — RYBELSUS 14 MG PO TABS: 14.0000 mg | ORAL_TABLET | Freq: Every day | ORAL | 0 refills | Status: DC

## 2020-06-14 NOTE — Chronic Care Management (AMB) (Signed)
Chronic Care Management   Pharmacy Note  06/14/2020 Name: Kevin Ryan MRN: [id number] DOB: Sep 28, 1950   Subjective:  Kevin Ryan is a 69 y.o. year old male who is a primary care patient of McLean-Scocuzza, Kevin Glow, MD. The CCM team was consulted for assistance with chronic disease management and care coordination needs.    Engaged with patient by telephone for follow up visit in response to provider referral for pharmacy case management and/or care coordination services.   Consent to Services:  Kevin Ryan was given information about Chronic Care Management services, agreed to services, and gave verbal consent prior to initiation of services on 11/03/19. Please see initial visit note for detailed documentation.   Review of patient status, including review of consultants reports, laboratory and other test data, was performed as part of comprehensive evaluation and provision of chronic care management services.   SDOH (Social Determinants of Health) assessments and interventions performed:  SDOH Interventions     Most Recent Value  SDOH Interventions  Financial Strain Interventions Other (Comment)  [manufacturer assistance]       Objective:  Lab Results  Component Value Date   CREATININE 0.91 05/14/2020   CREATININE 0.96 01/30/2020   CREATININE 0.88 10/14/2019    Lab Results  Component Value Date   HGBA1C 7.7 (H) 05/14/2020       Component Value Date/Time   CHOL 93 05/14/2020 1117   TRIG 181.0 (H) 05/14/2020 1117   HDL 29.90 (L) 05/14/2020 1117   CHOLHDL 3 05/14/2020 1117   VLDL 36.2 05/14/2020 1117   LDLCALC 27 05/14/2020 1117   LDLCALC 87 05/16/2018 0800   LDLDIRECT 52.0 01/30/2020 0918     Clinical ASCVD: Yes  The ASCVD Risk score (Goff DC Jr., et al., 2013) failed to calculate for the following reasons:   The patient has a prior MI or stroke diagnosis      BP Readings from Last 3 Encounters:  05/14/20 124/68  01/27/20 126/68  12/10/19 (!) 114/56     Assessment:   Allergies  Allergen Reactions  . Ibuprofen Itching and Rash    Medications Reviewed Today    Reviewed by Thressa Sheller, CMA (Certified Medical Assistant) on 05/14/20 at 1039  Med List Status: <None>  Medication Order Taking? Sig Documenting Provider Last Dose Status Informant  aspirin 81 MG tablet [id number] Yes Take 81 mg by mouth daily. [provider] Taking Active Self  cholecalciferol (VITAMIN D) 25 MCG (1000 UNIT) tablet [id number] Yes Take 1,000 Units by mouth daily.  [provider] Taking Active   clopidogrel (PLAVIX) 75 MG tablet [id number] Yes Take 75 mg by mouth daily. [provider] Taking Active Self  empagliflozin (JARDIANCE) 25 MG TABS tablet [id number] Yes Take 1 tablet (25 mg total) by mouth daily before breakfast. McLean-Scocuzza, Kevin Glow, MD Taking Active   ezetimibe (ZETIA) 10 MG tablet [id number] Yes Take 1 tablet (10 mg total) by mouth daily. McLean-Scocuzza, Kevin Glow, MD Taking Active   icosapent Ethyl (VASCEPA) 1 g capsule [id number] Yes Take 2 capsules (2 g total) by mouth 2 (two) times daily. McLean-Scocuzza, Kevin Glow, MD Taking Active   lisinopril (ZESTRIL) 2.5 MG tablet [id number] Yes Take 1 tablet (2.5 mg total) by mouth daily. McLean-Scocuzza, Kevin Glow, MD Taking Active   MAGNESIUM PO [id number] Yes Take by mouth daily. [provider] Taking Active   meclizine (ANTIVERT) 25 MG tablet [id number] Yes Take 25 mg by mouth 3 (three) times daily as needed for dizziness.  [provider] Taking Active   metFORMIN (GLUCOPHAGE XR) 500 MG 24 hr tablet [id number] Yes Take 2 tablets (1,000 mg total) by mouth in the morning and at bedtime. McLean-Scocuzza, Kevin Glow, MD Taking Active   metoprolol tartrate (LOPRESSOR) 50 MG tablet [id number] Yes Take 1 tablet (50 mg total) by mouth 2 (two) times daily. McLean-Scocuzza, Kevin Glow, MD Taking Active   Probiotic Product (PROBIOTIC DAILY PO) [id number] Yes Take by mouth. [provider] Taking Active   rosuvastatin (CRESTOR) 40 MG tablet [id number] Yes Take 1 tablet (40 mg total) by mouth at bedtime. McLean-Scocuzza, Kevin Glow, MD Taking Active   Semaglutide [facility]) 14 MG TABS [id number] Yes Take 14 mg by mouth daily. Take after completing 7 mg x 30 days McLean-Scocuzza, Kevin Glow, MD Taking Active   Testosterone 20.25 MG/ACT (1.62%) GEL [id number] Yes Apply 1 pump to each shoulder daily Stoioff, Ronda Fairly, MD Taking Active   vitamin B-12 (CYANOCOBALAMIN) 500 MCG tablet [id number] Yes Take 5,000 mcg by mouth daily.  [provider] Taking Active Self          Patient Active Problem List   Diagnosis Date Noted  . Low testosterone in male 05/14/2020  . Obesity (BMI 30.0-34.9) 02/26/2020  . Hypertension associated with diabetes ([place]) 01/29/2020  . Shoulder pain, right 01/27/2020  . Aortic atherosclerosis ([place]) 01/27/2020  . Emphysema of lung ([place]) 01/27/2020  . Hypogonadism in male 12/11/2019  . OSA on CPAP 09/19/2019  . Vertigo 09/17/2018  . 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 Hyperlipidemia 06/18/2018  . Excessive cerumen in both ear canals 05/16/2018  . 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 Elevated TSH 03/28/2018  . Lung nodule 03/28/2018  . CTS (carpal tunnel syndrome) 03/28/2018  . DM2 (diabetes mellitus, type 2) ([place]) 03/25/2018  . CAD (coronary artery disease) 03/25/2018  . Arthritis 03/25/2018  . Fatty liver 03/25/2018  . Vitamin D deficiency 03/25/2018  . Long term use of drug 01/23/2018  . Cervical spondylosis with myelopathy 11/21/2017  . Spinal stenosis of lumbar region at multiple levels 11/21/2017  . Cellulitis 01/29/2017  . Personal history of tobacco use, presenting hazards to health 07/13/2015  . 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 Anticipated assistance start date 07/31/20. See plan of care below for additional detail.   Patient Care Plan: Medication Management    Problem Identified: Diabetes, Hyperlipidemia     Long-Range Goal: Disease Progression Prevention   This Visit's Progress: On track  Priority: High  Note:   Current Barriers:  . Unable to independently afford treatment regimen . Unable to achieve control of diabetes   Pharmacist Clinical Goal(s):  Marland Kitchen Over the next 90 days, patient will verbalize ability to afford treatment regimen. Marland Kitchen achieve adherence to monitoring guidelines and medication adherence to achieve therapeutic efficacy. . maintain control of diabetes and hyperlipidemia as evidenced by labwork through collaboration with PharmD and provider.  .   Interventions: . Inter-disciplinary care team collaboration (see longitudinal plan of care) . Comprehensive medication review performed; medication list updated in electronic medical record  Diabetes: . Unontrolled; current treatment: Rybelsus 14 mg daily, metformin XR 1000 mg BID, Jardiance 25 mg daily o Rybelsus and Jardiance through patient assistance, working on 3154 reapplication. Novo Nordisk delayed in processing Rybelsus refill for patient; he only has ~  5 days remaining of therapy o Does note fullness, fecal urgency. Wonders if related to Rybelsus . Denies/reports hypoglycemic/hyperglycemic symptoms . Current meal patterns: continues to limit portion sizes. Overall weight loss since starting Rybelsus ~16 lbs . Recommended to trial reduction to Rybelsus 7 mg daily. Can split pill in half.  Marland Kitchen Collaborated w/ [name]. Voucher from [employer] obtained, called to pharmacy. They will order and have ready for patient later this week  Hypertension: . Controlled per last clinic reading; current treatment: lisinopril 2.5 mg QAM, metoprolol tartrate 50 mg  BID . Recommended to continue current regimen  Hyperlipidemia: . Controlled; current treatment: ezetimibe 10 mg daily, rosuvastatin 20 mg daily, Vascepa 2 g BID  o [employer] funding for Vascepa  . Recommended to continue current reegimen  ASCVD Secondary Prevention, Antiplatelet: . Appropriate treatment, aspirin 81 mg daily w/ clopidogrel 75 mg daily. Follows w/ Dr. Saralyn Pilar. 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 Recommended to continue current regimen  Patient Goals/Self-Care Activities . Over the next 90 days, patient will:  - focus on medication adherence by collaborating with team on medication access solutions - check blood glucose BID, document, and provide at future appointments  Follow Up Plan: Telephone follow up appointment with care management team member scheduled for: ~ 8 weeks      Plan: Telephone follow up appointment with care management team member scheduled for: 07/2020  [name], PharmD, [place], CPP Clinical Pharmacist [place] [phone]

## 2020-06-14 NOTE — Patient Outreach (Signed)
[facility]) Care Management  06/14/2020  Kevin Ryan [birth year] [id number]   Received in basket message from [facility] embedded [place] to f/u on patient's refill status for Rybelsus with [employer].  Spoke to [place] who informed the medication would be processed and shipped. Inquired about a voucher as patient will be out of medication in 3 days. She transferred the call to that department.  Spoke to [place] who was able to provided billing information so patient could pick up a 1 month supply at his local pharmacy. She informed the voucher was good until 06/29/2020. She informed the pharmacy would have to bill as primary and they were not to bill his Medicare D plan. The BIN is [id number], PCN is [id number], Group is [id number] and ID is [id number].  Will relay this information to embedded [place] to give to pharmacist at patient's local pharmacy when she calls in the patient's prescription.  Toniyah Dilmore P. Anastacio Bua, [place]  [phone]

## 2020-06-14 NOTE — Patient Instructions (Signed)
Visit Information Patient Care Plan: Medication Management    Problem Identified: Diabetes, Hyperlipidemia     Long-Range Goal: Disease Progression Prevention   This Visit's Progress: On track  Priority: High  Note:   Current Barriers:  . Unable to independently afford treatment regimen . Unable to achieve control of diabetes   Pharmacist Clinical Goal(s):  Marland Kitchen Over the next 90 days, patient will verbalize ability to afford treatment regimen. Marland Kitchen achieve adherence to monitoring guidelines and medication adherence to achieve therapeutic efficacy. . maintain control of diabetes and hyperlipidemia as evidenced by labwork through collaboration with PharmD and provider.  .   Interventions: . Inter-disciplinary care team collaboration (see longitudinal plan of care) . Comprehensive medication review performed; medication list updated in electronic medical record  Diabetes: . Unontrolled; current treatment: Rybelsus 14 mg daily, metformin XR 1000 mg BID, Jardiance 25 mg daily o Rybelsus and Jardiance through patient assistance, working on 0109 reapplication. Novo Nordisk delayed in processing Rybelsus refill for patient; he only has ~ 5 days remaining of therapy o Does note fullness, fecal urgency. Wonders if related to Rybelsus . Denies/reports hypoglycemic/hyperglycemic symptoms . Current meal patterns: continues to limit portion sizes. Overall weight loss since starting Rybelsus ~16 lbs . Recommended to trial reduction to Rybelsus 7 mg daily. Can split pill in half.  Marland Kitchen Collaborated w/ [name]. Voucher from [employer] obtained, called to pharmacy. They will order and have ready for patient later this week  Hypertension: . Controlled per last clinic reading; current treatment: lisinopril 2.5 mg QAM, metoprolol tartrate 50 mg BID . Recommended to continue current regimen  Hyperlipidemia: . Controlled; current treatment: ezetimibe 10 mg daily, rosuvastatin 20 mg daily, Vascepa 2 g BID   o [employer] funding for Vascepa  . Recommended to continue current reegimen  ASCVD Secondary Prevention, Antiplatelet: . Appropriate treatment, aspirin 81 mg daily w/ clopidogrel 75 mg daily. Follows w/ Dr. Saralyn Pilar. 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 Recommended to continue current regimen  Patient Goals/Self-Care Activities . Over the next 90 days, patient will:  - focus on medication adherence by collaborating with team on medication access solutions - check blood glucose BID, document, and provide at future appointments  Follow Up Plan: Telephone follow up appointment with care management team member scheduled for: ~ 8 weeks      The patient verbalized understanding of instructions, educational materials, and care plan provided today and declined offer to receive copy of patient instructions, educational materials, and care plan.      Plan: Telephone follow up appointment with care management team member scheduled for: 07/2020  [name], PharmD, [place], [place] Pharmacist [place] [phone]

## 2020-06-15 NOTE — Telephone Encounter (Signed)
Faxed request for physician's order to [facility] for pap pressure setting and length of need faxed on 06-14-2020

## 2020-06-18 ENCOUNTER — Telehealth: Payer: Self-pay | Admitting: Internal Medicine

## 2020-06-18 NOTE — Telephone Encounter (Signed)
Error

## 2020-06-29 ENCOUNTER — Telehealth: Payer: Self-pay | Admitting: Internal Medicine

## 2020-06-29 NOTE — Telephone Encounter (Signed)
Called to inform the Patient that his medication from [employer] is now available in office to pick up.  Will label and place upfront.   Patient verbalized understanding

## 2020-07-20 ENCOUNTER — Telehealth: Payer: Self-pay | Admitting: Pharmacist

## 2020-07-20 NOTE — Telephone Encounter (Signed)
Patient scheduled to come in 12/28 at 4:00 to be evaluated

## 2020-07-20 NOTE — Telephone Encounter (Signed)
Received voicemail from patient on my direct line at 12:32 pm asking for a return call at my convenience. I had a chance to return his call at 4:15 pm.  Reports constant "excruciating" hip pain in his right hip. Has been going on for ~ 3 weeks. Relieved by tylenol (but 2-3 doses in a day). Does not remember any injury, though notes remote history of back pain.    He has been taking doterra supplements to try to help, but is unable to tell me what is in this besides "vitamins". He handed wife the phone and she is upset. She notes a few month history of other nonspecific symptoms such as difficulty sleeping, but also more lethargy and being in bed longer.   Routing to PCP for evaluation.

## 2020-07-27 ENCOUNTER — Ambulatory Visit (INDEPENDENT_AMBULATORY_CARE_PROVIDER_SITE_OTHER): Payer: Medicare Other | Admitting: Internal Medicine

## 2020-07-27 ENCOUNTER — Ambulatory Visit (INDEPENDENT_AMBULATORY_CARE_PROVIDER_SITE_OTHER): Payer: Medicare Other

## 2020-07-27 ENCOUNTER — Encounter: Payer: Self-pay | Admitting: Internal Medicine

## 2020-07-27 ENCOUNTER — Other Ambulatory Visit: Payer: Self-pay

## 2020-07-27 VITALS — BP 110/60 | HR 111 | Temp 98.9°F | Ht 72.99 in | Wt 228.6 lb

## 2020-07-27 DIAGNOSIS — Z9889 Other specified postprocedural states: Secondary | ICD-10-CM

## 2020-07-27 DIAGNOSIS — M25552 Pain in left hip: Secondary | ICD-10-CM

## 2020-07-27 DIAGNOSIS — E1165 Type 2 diabetes mellitus with hyperglycemia: Secondary | ICD-10-CM

## 2020-07-27 DIAGNOSIS — E1159 Type 2 diabetes mellitus with other circulatory complications: Secondary | ICD-10-CM

## 2020-07-27 DIAGNOSIS — I152 Hypertension secondary to endocrine disorders: Secondary | ICD-10-CM

## 2020-07-27 DIAGNOSIS — I2510 Atherosclerotic heart disease of native coronary artery without angina pectoris: Secondary | ICD-10-CM | POA: Diagnosis not present

## 2020-07-27 DIAGNOSIS — M25551 Pain in right hip: Secondary | ICD-10-CM

## 2020-07-27 DIAGNOSIS — Z1389 Encounter for screening for other disorder: Secondary | ICD-10-CM

## 2020-07-27 DIAGNOSIS — C801 Malignant (primary) neoplasm, unspecified: Secondary | ICD-10-CM

## 2020-07-27 DIAGNOSIS — Z8509 Personal history of malignant neoplasm of other digestive organs: Secondary | ICD-10-CM

## 2020-07-27 DIAGNOSIS — C49A2 Gastrointestinal stromal tumor of stomach: Secondary | ICD-10-CM

## 2020-07-27 DIAGNOSIS — Z125 Encounter for screening for malignant neoplasm of prostate: Secondary | ICD-10-CM

## 2020-07-27 DIAGNOSIS — M5416 Radiculopathy, lumbar region: Secondary | ICD-10-CM

## 2020-07-27 DIAGNOSIS — D126 Benign neoplasm of colon, unspecified: Secondary | ICD-10-CM

## 2020-07-27 DIAGNOSIS — Z1211 Encounter for screening for malignant neoplasm of colon: Secondary | ICD-10-CM

## 2020-07-27 NOTE — Patient Instructions (Addendum)
You can try immodium over the counter if needed for diarrhea   voltaren gel 4x per day right hip if needed   Oxycodone/oxycontin   Consider ortho in the future right hip pain   Hamstring Strain  A hamstring strain happens when the muscles in the back of the thighs (hamstring muscles) are overstretched or torn. The hamstring muscles are used in straightening the hips, bending the knees, and pulling back the legs. This injury is often called a pulled hamstring muscle. The tissue that connects the muscle to a bone (tendon) may also be affected. The severity of a hamstring strain may be rated in degrees or grades. First-degree (or grade 1) strains have the least amount of muscle tearing and pain. Second-degree and third-degree (grade 2 and 3) strains have increasingly more tearing and pain. What are the causes? This condition is caused by a sudden, violent force being placed on the hamstring muscles, stretching them too far. This often happens during activities that involve running, jumping, kicking, or weight lifting. What increases the risk? Hamstring strains are especially common in athletes. The following factors may also make you more likely to develop this condition:  Having low strength, endurance, or flexibility of the hamstring muscles.  Doing high-impact physical activity or sports.  Having poor physical fitness.  Having a previous leg injury.  Having tired (fatigued) muscles. What are the signs or symptoms? Symptoms of this condition include:  Pain in the back of the thigh.  Swelling.  Bruising.  Muscle spasms.  Trouble moving the affected muscle because of pain. For severe strains, you may feel popping or snapping in the back of your thigh when the injury occurs. How is this diagnosed? This condition is diagnosed based on your symptoms, your medical history, and a physical exam. How is this treated? Treatment for this condition usually involves:  Protecting,  resting, icing, applying compression, and elevating the injured area (PRICE therapy).  Medicines. Your health care provider may recommend medicines to help reduce pain or inflammation.  Doing exercises to regain strength and flexibility in the muscles. Your health care provider will tell you when it is okay to begin exercising. Follow these instructions at home: PRICE therapy Use PRICE therapy to promote muscle healing during the first 2-3 days after your injury, or as told by your health care provider.  Protect the muscle from being injured again.  Rest your injury. This usually involves limiting your normal activities and not using the injured hamstring muscle. Talk with your health care provider about how you should limit your activities.  Apply ice to the injured area: ? Put ice in a plastic bag. ? Place a towel between your skin and the bag. ? Leave the ice on for 20 minutes, 2-3 times a day. After the third day, switch to applying heat as told.  Put pressure (compression) on your injured hamstring by wrapping it with an elastic bandage. Be careful not to wrap it too tightly. That may interfere with blood circulation or may increase swelling.  Raise (elevate) your injured hamstring above the level of your heart as often as possible. When you are lying down, you can do this by putting a pillow under your thigh.  Activity  Begin exercising or stretching only as told by your health care provider.  Do not return to full activity level until your health care provider approves.  To help prevent muscle strains in the future, always warm up before exercising and stretch afterward. General instructions  Take over-the-counter and prescription medicines only as told by your health care provider.  If directed, apply heat to the affected area as often as told by your health care provider. Use the heat source that your health care provider recommends, such as a moist heat pack or a heating  pad. ? Place a towel between your skin and the heat source. ? Leave the heat on for 20-30 minutes. ? Remove the heat if your skin turns bright red. This is especially important if you are unable to feel pain, heat, or cold. You may have a greater risk of getting burned.  Keep all follow-up visits as told by your health care provider. This is important. Contact a health care provider if you have:  Increasing pain or swelling in the injured area.  Numbness, tingling, or a significant loss of strength in the injured area. Get help right away if:  Your foot or your toes become cold or turn blue. Summary  A hamstring strain happens when the muscles in the back of the thighs (hamstring muscles) are overstretched or torn.  This injury can be caused by a sudden, violent force being placed on the hamstring muscles, causing them to stretch too far.  Symptoms include pain, swelling, and muscle spasms in the injured area.  Treatment includes what is called PRICE therapy: protecting, resting, icing, applying compression, and elevating the injured area. This information is not intended to replace advice given to you by your health care provider. Make sure you discuss any questions you have with your health care provider. Document Revised: 06/29/2017 Document Reviewed: 06/14/2017 [employer] Patient Education  2020 [employer]  Hamstring Strain Rehab Ask your health care provider which exercises are safe for you. Do exercises exactly as told by your health care provider and adjust them as directed. It is normal to feel mild stretching, pulling, tightness, or discomfort as you do these exercises. Stop right away if you feel sudden pain or your pain gets worse. Do not begin these exercises until told by your health care provider. Stretching and range-of-motion exercises These exercises warm up your muscles and joints and improve the movement and flexibility of your thighs. These exercises also help to  relieve pain, numbness, and tingling. Talk to your health care provider about these restrictions. Knee extension, seated  1. Sit with your left / right heel propped on a chair, a coffee table, or a footstool. Do not have anything under your knee to support it. 2. Allow your leg muscles to relax, letting gravity straighten out your knee (extension). You should feel a stretch behind your left / right knee. 3. If told by your health care provider, deepen the stretch by placing a __________ weight on your thigh, just above your kneecap. 4. Hold this position for __________ seconds. Repeat __________ times. Complete this exercise __________ times a day. Seated stretch This exercise is sometimes called hamstrings and adductors stretch. 1. Sit on the floor with your legs stretched wide. Keep your knees straight during this exercise. 2. Keeping your head and back in a straight line, bend at your waist to reach for your left foot (position A). You should feel a stretch in your right inner thigh (adductors). 3. Hold this position for __________ seconds. Then slowly return to the upright position. 4. Keeping your head and back in a straight line, bend at your waist to reach forward (position B). You should feel a stretch behind both of your thighs or knees (hamstrings). 5. Hold this  position for __________ seconds. Then slowly return to the upright position. 6. Keeping your head and back in a straight line, bend at your waist to reach for your right foot (position C). You should feel a stretch in your left inner thigh (adductors). 7. Hold this position for __________ seconds. Then slowly return to the upright position. Repeat __________ times. Complete this exercise __________ times a day. Hamstrings stretch, supine  1. Lie on your back (supine position). 2. Loop a belt or towel over the ball of your left / right foot. The ball of your foot is on the walking surface, right under your toes. 3. Straighten  your left / right knee and slowly pull on the belt or towel to raise your leg. ? Do not let your left / right knee bend while you do this. ? Keep your other leg flat on the floor. ? Raise the left / right leg until you feel a gentle stretch behind your left / right knee or thigh (hamstrings). 4. Hold this position for __________ seconds. 5. Slowly return your leg to the starting position. Repeat __________ times. Complete this exercise __________ times a day. Strengthening exercises These exercises build strength and endurance in your thighs. Endurance is the ability to use your muscles for a long time, even after they get tired. Straight leg raises, prone This exercise strengthens the muscles that move the hips (hip extensors). 1. Lie on your abdomen on a firm surface (prone position). 2. Tense the muscles in your buttocks and lift your left / right leg about 4 inches (10 cm). Keep your knee straight as you lift your leg. If you cannot lift your leg that high without arching your back, place a pillow under your hips. 3. Hold the position for __________ seconds. 4. Slowly lower your leg to the starting position. 5. Allow your muscles to relax completely before you start the next repetition. Repeat __________ times. Complete this exercise __________ times a day. Bridge This exercise strengthens the muscles in your buttocks and the back of your thighs (hip extensors). 1. Lie on your back on a firm surface with your knees bent and your feet flat on the floor. 2. Tighten your buttocks muscles and lift your bottom off the floor until the trunk of your body is level with your thighs. ? You should feel the muscles working in your buttocks and the back of your thighs. ? Do not arch your back. 3. Hold this position for __________ seconds. 4. Slowly lower your hips to the starting position. 5. Let your buttocks muscles relax completely between repetitions. 6. If told by your health care provider,  keep your bottom lifted off the floor while you slowly walk your feet away from you as far as you can control. Hold for __________ seconds, then slowly walk your feet back toward you. Repeat __________ times. Complete this exercise __________ times a day. Lateral walking with band This is an exercise in which you walk sideways (lateral), with tension provided by an exercise band. The exercise strengthens the muscles in your hip (hip abductors). 1. Stand in a long hallway. 2. Wrap a loop of exercise band around your legs, just above your knees. 3. Bend your knees gently and drop your hips down and back so your weight is over your heels. 4. Step to the side to move down the length of the hallway, keeping your toes pointed ahead of you and keeping tension in the band. 5. Repeat, leading with your other  leg. Repeat __________ times. Complete this exercise __________ times a day. Single leg stand with reaching This exercise is also called eccentric hamstring stretch. 1. Stand on your left / right foot. Keep your big toe down on the floor and try to keep your arch lifted. 2. Slowly reach down toward the floor as far as you can while keeping your balance. Lowering your thigh under tension is called eccentric stretching. 3. Hold this position for __________ seconds. Repeat __________ times. Complete this exercise __________ times a day. Plank, prone This exercise strengthens muscles in your abdomen and core area. 1. Lie on your abdomen on the floor (prone position),and prop yourself up on your elbows. Your hands should be straight out in front of you, and your elbows should be below your shoulders. Position your feet similar to a push-up position so your toes are on the ground. 2. Tighten your abdominal muscles and lift your body off the floor. ? Do not arch your back. ? Do not hold your breath. 3. Hold this position for __________ seconds. Repeat __________ times. Complete this exercise __________  times a day. This information is not intended to replace advice given to you by your health care provider. Make sure you discuss any questions you have with your health care provider. Document Revised: 11/07/2018 Document Reviewed: 07/15/2018 Else[employer] Patient Education  [place].  Sciatica Rehab Ask your health care provider which exercises are safe for you. Do exercises exactly as told by your health care provider and adjust them as directed. It is normal to feel mild stretching, pulling, tightness, or discomfort as you do these exercises. Stop right away if you feel sudden pain or your pain gets worse. Do not begin these exercises until told by your health care provider. Stretching and range-of-motion exercises These exercises warm up your muscles and joints and improve the movement and flexibility of your hips and back. These exercises also help to relieve pain, numbness, and tingling. Sciatic nerve glide 1. Sit in a chair with your head facing down toward your chest. Place your hands behind your back. Let your shoulders slump forward. 2. Slowly straighten one of your legs while you tilt your head back as if you are looking toward the ceiling. Only straighten your leg as far as you can without making your symptoms worse. 3. Hold this position for __________ seconds. 4. Slowly return to the starting position. 5. Repeat with your other leg. Repeat __________ times. Complete this exercise __________ times a day. Knee to chest with hip adduction and internal rotation  1. Lie on your back on a firm surface with both legs straight. 2. Bend one of your knees and move it up toward your chest until you feel a gentle stretch in your lower back and buttock. Then, move your knee toward the shoulder that is on the opposite side from your leg. This is hip adduction and internal rotation. ? Hold your leg in this position by holding on to the front of your knee. 3. Hold this position for __________  seconds. 4. Slowly return to the starting position. 5. Repeat with your other leg. Repeat __________ times. Complete this exercise __________ times a day. Prone extension on elbows  1. Lie on your abdomen on a firm surface. A bed may be too soft for this exercise. 2. Prop yourself up on your elbows. 3. Use your arms to help lift your chest up until you feel a gentle stretch in your abdomen and your lower back. ?  This will place some of your body weight on your elbows. If this is uncomfortable, try stacking pillows under your chest. ? Your hips should stay down, against the surface that you are lying on. Keep your hip and back muscles relaxed. 4. Hold this position for __________ seconds. 5. Slowly relax your upper body and return to the starting position. Repeat __________ times. Complete this exercise __________ times a day. Strengthening exercises These exercises build strength and endurance in your back. Endurance is the ability to use your muscles for a long time, even after they get tired. Pelvic tilt This exercise strengthens the muscles that lie deep in the abdomen. 1. Lie on your back on a firm surface. Bend your knees and keep your feet flat on the floor. 2. Tense your abdominal muscles. Tip your pelvis up toward the ceiling and flatten your lower back into the floor. ? To help with this exercise, you may place a small towel under your lower back and try to push your back into the towel. 3. Hold this position for __________ seconds. 4. Let your muscles relax completely before you repeat this exercise. Repeat __________ times. Complete this exercise __________ times a day. Alternating arm and leg raises  1. Get on your hands and knees on a firm surface. If you are on a hard floor, you may want to use padding, such as an exercise mat, to cushion your knees. 2. Line up your arms and legs. Your hands should be directly below your shoulders, and your knees should be directly below your  hips. 3. Lift your left leg behind you. At the same time, raise your right arm and straighten it in front of you. ? Do not lift your leg higher than your hip. ? Do not lift your arm higher than your shoulder. ? Keep your abdominal and back muscles tight. ? Keep your hips facing the ground. ? Do not arch your back. ? Keep your balance carefully, and do not hold your breath. 4. Hold this position for __________ seconds. 5. Slowly return to the starting position. 6. Repeat with your right leg and your left arm. Repeat __________ times. Complete this exercise __________ times a day. Posture and body mechanics Good posture and healthy body mechanics can help to relieve stress in your body's tissues and joints. Body mechanics refers to the movements and positions of your body while you do your daily activities. Posture is part of body mechanics. Good posture means:  Your spine is in its natural S-curve position (neutral).  Your shoulders are pulled back slightly.  Your head is not tipped forward. Follow these guidelines to improve your posture and body mechanics in your everyday activities. Standing   When standing, keep your spine neutral and your feet about hip width apart. Keep a slight bend in your knees. Your ears, shoulders, and hips should line up.  When you do a task in which you stand in one place for a long time, place one foot up on a stable object that is 2-4 inches (5-10 cm) high, such as a footstool. This helps keep your spine neutral. Sitting   When sitting, keep your spine neutral and keep your feet flat on the floor. Use a footrest, if necessary, and keep your thighs parallel to the floor. Avoid rounding your shoulders, and avoid tilting your head forward.  When working at a desk or a computer, keep your desk at a height where your hands are slightly lower than your elbows. Slide  your chair under your desk so you are close enough to maintain good posture.  When working at  a computer, place your monitor at a height where you are looking straight ahead and you do not have to tilt your head forward or downward to look at the screen. Resting  When lying down and resting, avoid positions that are most painful for you.  If you have pain with activities such as sitting, bending, stooping, or squatting, lie in a position in which your body does not bend very much. For example, avoid curling up on your side with your arms and knees near your chest (fetal position).  If you have pain with activities such as standing for a long time or reaching with your arms, lie with your spine in a neutral position and bend your knees slightly. Try the following positions: ? Lying on your side with a pillow between your knees. ? Lying on your back with a pillow under your knees. Lifting   When lifting objects, keep your feet at least shoulder width apart and tighten your abdominal muscles.  Bend your knees and hips and keep your spine neutral. It is important to lift using the strength of your legs, not your back. Do not lock your knees straight out.  Always ask for help to lift heavy or awkward objects. This information is not intended to replace advice given to you by your health care provider. Make sure you discuss any questions you have with your health care provider. Document Revised: 11/08/2018 Document Reviewed: 08/08/2018 Else[employer] Patient Education  [place].  Hip Exercises Ask your health care provider which exercises are safe for you. Do exercises exactly as told by your health care provider and adjust them as directed. It is normal to feel mild stretching, pulling, tightness, or discomfort as you do these exercises. Stop right away if you feel sudden pain or your pain gets worse. Do not begin these exercises until told by your health care provider. Stretching and range-of-motion exercises These exercises warm up your muscles and joints and improve the movement  and flexibility of your hip. These exercises also help to relieve pain, numbness, and tingling. You may be asked to limit your range of motion if you had a hip replacement. Talk to your health care provider about these restrictions. Hamstrings, supine  6. Lie on your back (supine position). 7. Loop a belt or towel over the ball of your left / right foot. The ball of your foot is on the walking surface, right under your toes. 8. Straighten your left / right knee and slowly pull on the belt or towel to raise your leg until you feel a gentle stretch behind your knee (hamstring). ? Do not let your knee bend while you do this. ? Keep your other leg flat on the floor. 9. Hold this position for __________ seconds. 10. Slowly return your leg to the starting position. Repeat __________ times. Complete this exercise __________ times a day. Hip rotation  6. Lie on your back on a firm surface. 7. With your left / right hand, gently pull your left / right knee toward the shoulder that is on the same side of the body. Stop when your knee is pointing toward the ceiling. 8. Hold your left / right ankle with your other hand. 9. Keeping your knee steady, gently pull your left / right ankle toward your other shoulder until you feel a stretch in your buttocks. ? Keep your hips and  shoulders firmly planted while you do this stretch. 10. Hold this position for __________ seconds. Repeat __________ times. Complete this exercise __________ times a day. Seated stretch This exercise is sometimes called hamstrings and adductors stretch. 6. Sit on the floor with your legs stretched wide. Keep your knees straight during this exercise. 7. Keeping your head and back in a straight line, bend at your waist to reach for your left foot (position A). You should feel a stretch in your right inner thigh (adductors). 8. Hold this position for __________ seconds. Then slowly return to the upright position. 9. Keeping your head and  back in a straight line, bend at your waist to reach forward (position B). You should feel a stretch behind both of your thighs and knees (hamstrings). 10. Hold this position for __________ seconds. Then slowly return to the upright position. 11. Keeping your head and back in a straight line, bend at your waist to reach for your right foot (position C). You should feel a stretch in your left inner thigh (adductors). 12. Hold this position for __________ seconds. Then slowly return to the upright position. Repeat __________ times. Complete this exercise __________ times a day. Lunge This exercise stretches the muscles of the hip (hip flexors). 5. Place your left / right knee on the floor and bend your other knee so that is directly over your ankle. You should be half-kneeling. 6. Keep good posture with your head over your shoulders. 7. Tighten your buttocks to point your tailbone downward. This will prevent your back from arching too much. 8. You should feel a gentle stretch in the front of your left / right thigh and hip. If you do not feel a stretch, slide your other foot forward slightly and then slowly lunge forward with your chest up until your knee once again lines up over your ankle. ? Make sure your tailbone continues to point downward. 9. Hold this position for __________ seconds. 10. Slowly return to the starting position. Repeat __________ times. Complete this exercise __________ times a day. Strengthening exercises These exercises build strength and endurance in your hip. Endurance is the ability to use your muscles for a long time, even after they get tired. Bridge This exercise strengthens the muscles of your hip (hip extensors). 7. Lie on your back on a firm surface with your knees bent and your feet flat on the floor. 8. Tighten your buttocks muscles and lift your bottom off the floor until the trunk of your body and your hips are level with your thighs. ? Do not arch your  back. ? You should feel the muscles working in your buttocks and the back of your thighs. If you do not feel these muscles, slide your feet 1-2 inches (2.5-5 cm) farther away from your buttocks. 9. Hold this position for __________ seconds. 10. Slowly lower your hips to the starting position. 11. Let your muscles relax completely between repetitions. Repeat __________ times. Complete this exercise __________ times a day. Straight leg raises, side-lying This exercise strengthens the muscles that move the hip joint away from the center of the body (hip abductors). 1. Lie on your side with your left / right leg in the top position. Lie so your head, shoulder, hip, and knee line up. You may bend your bottom knee slightly to help you balance. 2. Roll your hips slightly forward, so your hips are stacked directly over each other and your left / right knee is facing forward. 3. Leading with your  heel, lift your top leg 4-6 inches (10-15 cm). You should feel the muscles in your top hip lifting. ? Do not let your foot drift forward. ? Do not let your knee roll toward the ceiling. 4. Hold this position for __________ seconds. 5. Slowly return to the starting position. 6. Let your muscles relax completely between repetitions. Repeat __________ times. Complete this exercise __________ times a day. Straight leg raises, side-lying This exercise strengthens the muscles that move the hip joint toward the center of the body (hip adductors). 1. Lie on your side with your left / right leg in the bottom position. Lie so your head, shoulder, hip, and knee line up. You may place your upper foot in front to help you balance. 2. Roll your hips slightly forward, so your hips are stacked directly over each other and your left / right knee is facing forward. 3. Tense the muscles in your inner thigh and lift your bottom leg 4-6 inches (10-15 cm). 4. Hold this position for __________ seconds. 5. Slowly return to the  starting position. 6. Let your muscles relax completely between repetitions. Repeat __________ times. Complete this exercise __________ times a day. Straight leg raises, supine This exercise strengthens the muscles in the front of your thigh (quadriceps). 1. Lie on your back (supine position) with your left / right leg extended and your other knee bent. 2. Tense the muscles in the front of your left / right thigh. You should see your kneecap slide up or see increased dimpling just above your knee. 3. Keep these muscles tight as you raise your leg 4-6 inches (10-15 cm) off the floor. Do not let your knee bend. 4. Hold this position for __________ seconds. 5. Keep these muscles tense as you lower your leg. 6. Relax the muscles slowly and completely between repetitions. Repeat __________ times. Complete this exercise __________ times a day. Hip abductors, standing This exercise strengthens the muscles that move the leg and hip joint away from the center of the body (hip abductors). 1. Tie one end of a rubber exercise band or tubing to a secure surface, such as a chair, table, or pole. 2. Loop the other end of the band or tubing around your left / right ankle. 3. Keeping your ankle with the band or tubing directly opposite the secured end, step away until there is tension in the tubing or band. Hold on to a chair, table, or pole as needed for balance. 4. Lift your left / right leg out to your side. While you do this: ? Keep your back upright. ? Keep your shoulders over your hips. ? Keep your toes pointing forward. ? Make sure to use your hip muscles to slowly lift your leg. Do not tip your body or forcefully lift your leg. 5. Hold this position for __________ seconds. 6. Slowly return to the starting position. Repeat __________ times. Complete this exercise __________ times a day. Squats This exercise strengthens the muscles in the front of your thigh (quadriceps). 1. Stand in a door frame so  your feet and knees are in line with the frame. You may place your hands on the frame for balance. 2. Slowly bend your knees and lower your hips like you are going to sit in a chair. ? Keep your lower legs in a straight-up-and-down position. ? Do not let your hips go lower than your knees. ? Do not bend your knees lower than told by your health care provider. ? If your hip  pain increases, do not bend as low. 3. Hold this position for ___________ seconds. 4. Slowly push with your legs to return to standing. Do not use your hands to pull yourself to standing. Repeat __________ times. Complete this exercise __________ times a day. This information is not intended to replace advice given to you by your health care provider. Make sure you discuss any questions you have with your health care provider. Document Revised: 02/20/2019 Document Reviewed: 05/28/2018 Elsevier Patient Education  [place].

## 2020-07-27 NOTE — Progress Notes (Signed)
Chief Complaint  Patient presents with  . Hip Pain    Pt c/o hip pain x3-4 weeks.   F/u  1. C/o right hip pain x 3-4 weeks at times 4-9/10 nothing tried other than tylenol sx's better today this is new pain rad to hamstring/thigh at times had trouble walking due to pain s/p low back surgery in the past  He had had a fall x 1  He has left over oxycontin from prior back surgery and will try this when pain gets worse   2. DM 2 unable to tolerate rybelsus 14 due to abdominal pain diarrhea  Review of Systems  Constitutional: Positive for weight loss.       Down 24 lbs on rybelsus  HENT: Positive for hearing loss.   Respiratory: Negative for shortness of breath.   Cardiovascular: Negative for chest pain.  Gastrointestinal: Positive for abdominal pain and diarrhea.  Musculoskeletal: Positive for joint pain.   Past Medical History:  Diagnosis Date  . Abnormal CT of the abdomen   . Anemia    decreased platelets in last blood work  . 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 CAD (coronary artery disease)    s/p MI  . CAD (coronary artery disease)    s/p stent x 1 2004   . Cancer (HCC)    spindle cell stomach   . 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 Gastric mass 04/12/2015   cancer; resected  . History of kidney stones   . Hypercholesterolemia   . Hypertension   . Myocardial infarction ([place]) 2004  . Personal history of tobacco use, presenting hazards to health 07/13/2015  . Pneumonia   . Sleep apnea    CPAP  . 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 Vertigo    none in approx 1 yr  . Wears dentures    partial upper  . Wears hearing aid in both ears    Past Surgical History:  Procedure Laterality Date  . BACK SURGERY     01/29/18 [facility] lower  back   . CARDIAC CATHETERIZATION  2004   1 stent  . CARPAL TUNNEL RELEASE Bilateral   . CATARACT EXTRACTION W/PHACO Left 06/04/2019   Procedure: CATARACT EXTRACTION PHACO AND INTRAOCULAR LENS PLACEMENT ([facility]) LEFT  DIABETIC 01:08.3  18.6%  12.76;  Surgeon: Leandrew Koyanagi, MD;  Location: [facility];  Service: Ophthalmology;  Laterality: Left;  diabetic - oral meds sleep apnea  . CATARACT EXTRACTION W/PHACO Right 07/02/2019   Procedure: CATARACT EXTRACTION PHACO AND INTRAOCULAR LENS PLACEMENT ([place]) Right DIABETIC;  Surgeon: Leandrew Koyanagi, MD;  Location: [facility];  Service: Ophthalmology;  Laterality: Right;  diabetic-oral med sleep apnea (CPAP)  . COLONOSCOPY WITH PROPOFOL N/A 03/26/2015   Procedure: COLONOSCOPY WITH PROPOFOL;  Surgeon: Josefine Class, MD;  Location: [facility] ENDOSCOPY;  Service: Endoscopy;  Laterality: N/A;  . KNEE SURGERY     TKR  . KNEE SURGERY     b/l partial knee Dr. Leanna Sato   . PARTIAL GASTRECTOMY     2016 [facility]   Family History  Problem Relation Age of Onset  . Diabetes Mellitus II Maternal Grandfather   . Heart disease Maternal Grandfather   . CAD Paternal Grandfather   . Cancer Paternal Grandfather        ? type  . Arthritis Mother   . Depression Mother   . Hearing loss Mother   . Diabetes Father   .  Cancer Brother        throat smoker   . Hearing loss Maternal Grandmother   . Arthritis Maternal Grandmother    Social History   Socioeconomic History  . Marital status: Married    Spouse name: Not on file  . Number of children: Not on file  . Years of education: Not on file  . Highest education level: Not on file  Occupational History  . Not on file  Tobacco Use  . Smoking status: Former Smoker    Packs/day: 0.50    Years: 50.00    Pack years: 25.00    Types: Cigarettes  . Smokeless tobacco: Never Used  . Tobacco comment: 1 ppd or a little less (since age 27)  Vaping Use  . Vaping Use: Former  Substance and Sexual Activity  . Alcohol use: No    Comment: may have drink 3-4x/yr  . Drug use: No  . Sexual activity: Yes    Birth control/protection: None  Other Topics Concern  . Not on file  Social History Narrative    Owns guns    Wears seat belt    Safe in relationship    Married 2 sons    -married 47 years as of 09/2019    On disability    Assoc degree and prev supervisor    Retired as of 09/2019 end of month       Social Determinants of Health   Financial Resource Strain: Medium Risk  . Difficulty of Paying Living Expenses: Somewhat hard  Food Insecurity: Not on file  Transportation Needs: Not on file  Physical Activity: Not on file  Stress: Not on file  Social Connections: Not on file  Intimate Partner Violence: Not on file   Current Meds  Medication Sig  . aspirin 81 MG tablet Take 81 mg by mouth daily.  . cholecalciferol (VITAMIN D) 25 MCG (1000 UNIT) tablet Take 1,000 Units by mouth daily.   . clopidogrel (PLAVIX) 75 MG tablet Take 75 mg by mouth daily.  . empagliflozin (JARDIANCE) 25 MG TABS tablet Take 1 tablet (25 mg total) by mouth daily before breakfast.  . ezetimibe (ZETIA) 10 MG tablet Take 1 tablet (10 mg total) by mouth daily.  Marland Kitchen icosapent Ethyl (VASCEPA) 1 g capsule Take 2 capsules (2 g total) by mouth 2 (two) times daily.  Marland Kitchen lisinopril (ZESTRIL) 2.5 MG tablet Take 1 tablet (2.5 mg total) by mouth daily.  Marland Kitchen MAGNESIUM PO Take by mouth daily.  . meclizine (ANTIVERT) 25 MG tablet Take 25 mg by mouth 3 (three) times daily as needed for dizziness.  . metFORMIN (GLUCOPHAGE XR) 500 MG 24 hr tablet Take 2 tablets (1,000 mg total) by mouth in the morning and at bedtime.  . metoprolol tartrate (LOPRESSOR) 50 MG tablet Take 1 tablet (50 mg total) by mouth 2 (two) times daily.  Marland Kitchen neomycin-polymyxin-hydrocortisone (CORTISPORIN) OTIC solution Place 3 drops into the left ear 4 (four) times daily. X up to 1 week prn left ear  . Omega-3 Fatty Acids (FISH OIL) 1000 MG CAPS Take by mouth.  . Probiotic Product (PROBIOTIC DAILY PO) Take by mouth.  . rosuvastatin (CRESTOR) 40 MG tablet Take 1 tablet (40 mg total) by mouth at bedtime.  . Semaglutide (RYBELSUS) 7 MG TABS Take by mouth.  . vitamin  B-12 (CYANOCOBALAMIN) 500 MCG tablet Take 5,000 mcg by mouth daily.    Allergies  Allergen Reactions  . Ibuprofen Itching and Rash   Recent Results (from the  past 2160 hour(s))  Comprehensive metabolic panel     Status: Abnormal   Collection Time: 05/14/20 11:17 AM  Result Value Ref Range   Sodium 140 135 - 145 mEq/L   Potassium 4.6 3.5 - 5.1 mEq/L   Chloride 101 96 - 112 mEq/L   CO2 29 19 - 32 mEq/L   Glucose, Bld 137 (H) 70 - 99 mg/dL   BUN 17 6 - 23 mg/dL   Creatinine, Ser 0.91 0.40 - 1.50 mg/dL   Total Bilirubin 0.7 0.2 - 1.2 mg/dL   Alkaline Phosphatase 77 39 - 117 U/L   AST 17 0 - 37 U/L   ALT 18 0 - 53 U/L   Total Protein 7.4 6.0 - 8.3 g/dL   Albumin 4.5 3.5 - 5.2 g/dL   GFR 85.22 >60.00 mL/min   Calcium 9.5 8.4 - 10.5 mg/dL  Lipid panel     Status: Abnormal   Collection Time: 05/14/20 11:17 AM  Result Value Ref Range   Cholesterol 93 0 - 200 mg/dL    Comment: ATP III Classification       Desirable:  < 200 mg/dL               Borderline High:  200 - 239 mg/dL          High:  > = 240 mg/dL   Triglycerides 181.0 (H) 0.0 - 149.0 mg/dL    Comment: Normal:  <150 mg/dLBorderline High:  150 - 199 mg/dL   HDL 29.90 (L) >39.00 mg/dL   VLDL 36.2 0.0 - 40.0 mg/dL   LDL Cholesterol 27 0 - 99 mg/dL   Total CHOL/HDL Ratio 3     Comment:                Men          Women1/2 Average Risk     3.4          3.3Average Risk          5.0          4.42X Average Risk          9.6          7.13X Average Risk          15.0          11.0                       NonHDL 63.27     Comment: NOTE:  Non-HDL goal should be 30 mg/dL higher than patient's LDL goal (i.e. LDL goal of < 70 mg/dL, would have non-HDL goal of < 100 mg/dL)  CBC with Differential/Platelet     Status: Abnormal   Collection Time: 05/14/20 11:17 AM  Result Value Ref Range   WBC 10.1 4.0 - 10.5 K/uL   RBC 4.84 4.22 - 5.81 Mil/uL   Hemoglobin 15.7 13.0 - 17.0 g/dL   HCT 47.2 39.0 - 52.0 %   MCV 97.5 78.0 - 100.0 fl   MCHC 33.3  30.0 - 36.0 g/dL   RDW 14.2 11.5 - 15.5 %   Platelets 146.0 (L) 150.0 - 400.0 K/uL   Neutrophils Relative % 66.7 43.0 - 77.0 %   Lymphocytes Relative 22.6 12.0 - 46.0 %   Monocytes Relative 6.2 3.0 - 12.0 %   Eosinophils Relative 3.9 0.0 - 5.0 %   Basophils Relative 0.6 0.0 - 3.0 %   Neutro Abs 6.7 1.4 - 7.7 K/uL  Lymphs Abs 2.3 0.7 - 4.0 K/uL   Monocytes Absolute 0.6 0.1 - 1.0 K/uL   Eosinophils Absolute 0.4 0.0 - 0.7 K/uL   Basophils Absolute 0.1 0.0 - 0.1 K/uL  TSH     Status: None   Collection Time: 05/14/20 11:17 AM  Result Value Ref Range   TSH 1.64 0.35 - 4.50 uIU/mL  Hemoglobin A1c     Status: Abnormal   Collection Time: 05/14/20 11:17 AM  Result Value Ref Range   Hgb A1c MFr Bld 7.7 (H) 4.6 - 6.5 %    Comment: Glycemic Control Guidelines for People with Diabetes:Non Diabetic:  <6%Goal of Therapy: <7%Additional Action Suggested:  >8%    Objective  Body mass index is 30.17 kg/m. Wt Readings from Last 3 Encounters:  07/27/20 228 lb 9.6 oz (103.7 kg)  05/14/20 237 lb 12.8 oz (107.9 kg)  01/27/20 252 lb 6.4 oz (114.5 kg)   Temp Readings from Last 3 Encounters:  07/27/20 98.9 F (37.2 C)  05/14/20 98.3 F (36.8 C) (Oral)  01/27/20 98 F (36.7 C)   BP Readings from Last 3 Encounters:  07/27/20 110/60  05/14/20 124/68  01/27/20 126/68   Pulse Readings from Last 3 Encounters:  07/27/20 (!) 111  05/14/20 87  01/27/20 90    Physical Exam Vitals and nursing note reviewed.  Constitutional:      Appearance: Normal appearance. He is well-developed and well-groomed. He is obese.  HENT:     Head: Normocephalic and atraumatic.  Eyes:     Conjunctiva/sclera: Conjunctivae normal.     Pupils: Pupils are equal, round, and reactive to light.  Cardiovascular:     Rate and Rhythm: Normal rate and regular rhythm.     Heart sounds: Normal heart sounds. No murmur heard.   Pulmonary:     Effort: Pulmonary effort is normal.     Breath sounds: Normal breath sounds.   Musculoskeletal:       Legs:  Skin:    General: Skin is warm and dry.  Neurological:     General: No focal deficit present.     Mental Status: He is alert and oriented to person, place, and time. Mental status is at baseline.     Gait: Gait normal.     Comments: Walks with braces on lower legs Gait baseline  Psychiatric:        Attention and Perception: Attention and perception normal.        Mood and Affect: Mood and affect normal.        Speech: Speech normal.        Behavior: Behavior normal. Behavior is cooperative.        Thought Content: Thought content normal.        Cognition and Memory: Cognition and memory normal.        Judgment: Judgment normal.     Assessment  Plan  Right hip pain - Plan: DG HIP UNILAT WITH PELVIS 2-3 VIEWS RIGHT, CBC w/Diff Consider ortho  Consider f/u [facility] s/p low back surgery NS Dr. Simona Huh turner  Consider PT Has prn oxycontin from prior surgery will take extreme pain  Type 2 diabetes mellitus with hyperglycemia, improved A1C 7.7 from 8.2 with weight loss - Plan: Comprehensive metabolic panel, Lipid panel, CBC w/Diff, Hemoglobin A1c Refilled out form to get rybelsus 7 mg daily did not tolerate 14 mg qd on jardiance 25 mg qd zetia 10, vascepa 2bid. Lis 2.5 mg qd metformin xr 1000 mg bid crestor 40  HM Flu shot and covid declines 2021  Tdap and shingles [facility] -Tdap no recently recordper pt had in 2016 or 2017in EDno record  prevnar utd  pna 23 utd Consider shingrix  rec hepA/hep Bpt declinesh/o fatty liver MMR immune  H/o colon polyps 03/26/15 polys x 3-4 tubular and hyperplastic repeat in 3-5 years [facility] 03/25/20 [facility] -07/27/20 will refer [facility]  PSA normal0.29 11/17/2020due ordered B12 01/23/18 474   H/o lung nodules consider ct chest in future  -smoker age 21 to present max 1.5 ppd now <1 ppd no FH lung cancer rec cessation -CT chest  10/24/19 IMPRESSION: 1. Lung-RADS 2, benign appearance or behavior. Continue annual screening with low-dose chest CT without contrast in 12 months. 2. Hepatic steatosis. 3. Emphysema (ICD10-J43.9) and Aortic Atherosclerosis (ICD10-170.0) REC SMOKING CESSATION  Consider dermatology in the futurebut currently no need as of 05/14/20  H/o riedel [facility] appt 6/2021H/O SPINDLE CELL CA/GIST TUMOR STOMACH f/u 06/2020 and if normal then yearly 07/07/20 Impression:  No evidence of recurrent or metastatic disease within the abdomen or  pelvis.    Provider: Dr. [name]-Internal Medicine

## 2020-07-27 NOTE — Progress Notes (Signed)
Error

## 2020-07-28 ENCOUNTER — Telehealth: Payer: Self-pay

## 2020-07-28 NOTE — Telephone Encounter (Signed)
LVM to Move 11/10/20 appt back to 11/12/20 labs and f/u in 4-5 months sooner prn

## 2020-07-29 ENCOUNTER — Encounter: Payer: Self-pay | Admitting: Internal Medicine

## 2020-07-29 ENCOUNTER — Telehealth: Payer: Self-pay | Admitting: Internal Medicine

## 2020-07-29 DIAGNOSIS — Z8509 Personal history of malignant neoplasm of other digestive organs: Secondary | ICD-10-CM | POA: Insufficient documentation

## 2020-07-29 DIAGNOSIS — Z9889 Other specified postprocedural states: Secondary | ICD-10-CM | POA: Insufficient documentation

## 2020-07-29 NOTE — Telephone Encounter (Signed)
Right hip spurring early likely related mild arthritis Does he want consult with orthopedics? With history of back surgery pain can also be referred from the back  Does he want to try PT?

## 2020-07-29 NOTE — Addendum Note (Signed)
Addended by: Quentin Ore on: 07/29/2020 02:06 PM   Modules accepted: Orders

## 2020-07-29 NOTE — Telephone Encounter (Signed)
Called and spoke with Patient's wife. She states that he will not cooperate with P.T so this was declined.   Would like to hold off on referral to Ortho. States Patient is having pain from his back and down in to his thighs. She would like for him to see Dr Sherlynn Carbon [facility] neurosurgery first and then they will call us with what they want to do.

## 2020-08-02 ENCOUNTER — Ambulatory Visit: Payer: Medicare Other | Admitting: Pharmacist

## 2020-08-02 DIAGNOSIS — E1165 Type 2 diabetes mellitus with hyperglycemia: Secondary | ICD-10-CM

## 2020-08-02 DIAGNOSIS — E1159 Type 2 diabetes mellitus with other circulatory complications: Secondary | ICD-10-CM

## 2020-08-02 DIAGNOSIS — I152 Hypertension secondary to endocrine disorders: Secondary | ICD-10-CM

## 2020-08-02 DIAGNOSIS — E782 Mixed hyperlipidemia: Secondary | ICD-10-CM

## 2020-08-02 DIAGNOSIS — I2510 Atherosclerotic heart disease of native coronary artery without angina pectoris: Secondary | ICD-10-CM

## 2020-08-02 NOTE — Patient Instructions (Signed)
Visit Information  Patient Care Plan: Medication Management    Problem Identified: Diabetes, Hyperlipidemia     Long-Range Goal: Disease Progression Prevention   This Visit's Progress: On track  Recent Progress: On track  Priority: High  Note:   Current Barriers:  . Unable to independently afford treatment regimen . Unable to achieve control of diabetes   Pharmacist Clinical Goal(s):  Marland Kitchen Over the next 90 days, patient will verbalize ability to afford treatment regimen. . Over the next 90 days, patient will  achieve adherence to monitoring guidelines and medication adherence to achieve therapeutic efficacy. . Over the next 90 days, patient will maintain control of diabetes and hyperlipidemia as evidenced by labwork through collaboration with PharmD and provider.   Interventions: . 1:1 collaboration with McLean-Scocuzza, Pasty Spillers, MD regarding development and update of comprehensive plan of care as evidenced by provider attestation and co-signature . Inter-disciplinary care team collaboration (see longitudinal plan of care) . Comprehensive medication review performed; medication list updated in electronic medical record  Diabetes: . Unontrolled; current treatment: Rybelsus 7 mg daily (dose reduced d/t GI upset), metformin XR 1000 mg BID, Jardiance 25 mg daily . Patient notes that he had some stomach upset this morning, but then noted that he had 2 ham biscuits for breakfast. Discussed principals of delayed gastric emptying w/ GLP1 and increased satiety, recommended to limit to 1 biscuit moving forward. Patient verbalized understanding . Discussed that [employer] app for 2022 has been submitted for Rybelsus 7 mg daily. Patient approved for Jardiance through BI through 07/30/21. Marland Kitchen Continue current regimen at this time.   Hypertension: . Controlled per last clinic reading; current treatment: lisinopril 2.5 mg QAM, metoprolol tartrate 50 mg BID . Recommended to continue current regimen . Will  discuss home BP monitoring moving forward   Hyperlipidemia: . Controlled; current treatment: ezetimibe 10 mg daily, rosuvastatin 20 mg daily, Vascepa 2 g BID  o [employer] funding for Vascepa, rosuvastatin, and ezetimibe through 12/2020.  Marland Kitchen Recommended to continue current regimen . Could consider dose maximization of rosuvastatin 40 mg daily for optimized triglyceride lowering.   ASCVD Secondary Prevention, Antiplatelet: . Appropriate treatment, aspirin 81 mg daily w/ clopidogrel 75 mg daily. Follows w/ Dr. Darrold Junker. 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 Recommended to continue current regimen  Patient Goals/Self-Care Activities . Over the next 90 days, patient will:  - focus on medication adherence by collaborating with team on medication access solutions - check blood glucose BID, document, and provide at future appointments  Follow Up Plan: Telephone follow up appointment with care management team member scheduled for: ~ 8 weeks     The patient verbalized understanding of instructions, educational materials, and care plan provided today and declined offer to receive copy of patient instructions, educational materials, and care plan.   Plan: Telephone follow up appointment with care management team member scheduled for:~ 8 weeks  Catie Feliz Beam, PharmD, [place], CPP Clinical Pharmacist [employer] at [employer] [phone]

## 2020-08-02 NOTE — Chronic Care Management (AMB) (Signed)
Chronic Care Management   Pharmacy Note  08/02/2020 Name: Kevin Ryan MRN: [id number] DOB: [birth year]  Subjective:  Kevin Ryan is a 70 y.o. year old male who is a primary care patient of McLean-Scocuzza, Pasty Spillers, MD. The CCM team was consulted for assistance with chronic disease management and care coordination needs.    Engaged with patient by telephone for response to patient call over the weekend  in response to provider referral for pharmacy case management and/or care coordination services.   Consent to Services:  Patient was given information about Chronic Care Management services, agreed to services, and gave verbal consent prior to initiation of services on 11/03/19. Please see initial visit note for detailed documentation.   Objective:  Lab Results  Component Value Date   CREATININE 0.91 05/14/2020   CREATININE 0.96 01/30/2020   CREATININE 0.88 10/14/2019    Lab Results  Component Value Date   HGBA1C 7.7 (H) 05/14/2020       Component Value Date/Time   CHOL 93 05/14/2020 1117   TRIG 181.0 (H) 05/14/2020 1117   HDL 29.90 (L) 05/14/2020 1117   CHOLHDL 3 05/14/2020 1117   VLDL 36.2 05/14/2020 1117   LDLCALC 27 05/14/2020 1117   LDLCALC 87 05/16/2018 0800   LDLDIRECT 52.0 01/30/2020 0918     BP Readings from Last 3 Encounters:  07/27/20 110/60  05/14/20 124/68  01/27/20 126/68    Assessment/Interventions: Review of patient past medical history, allergies, medications, health status, including review of consultants reports, laboratory and other test data, was performed as part of comprehensive evaluation and provision of chronic care management services.   SDOH (Social Determinants of Health) assessments and interventions performed:  SDOH Interventions   Flowsheet Row Most Recent Value  SDOH Interventions   Financial Strain Interventions Other (Comment)  [manufacturer assistance, grant funding]       CCM Care Plan  Allergies  Allergen Reactions  .  Ibuprofen Itching and Rash    Medications Reviewed Today    Reviewed by Lourena Simmonds, RPH-CPP (Pharmacist) on 08/02/20 at 1144  Med List Status: <None>  Medication Order Taking? Sig Documenting Provider Last Dose Status Informant  aspirin 81 MG tablet [id number] Yes Take 81 mg by mouth daily. [provider] Taking Active Self  cholecalciferol (VITAMIN D) 25 MCG (1000 UNIT) tablet [id number] Yes Take 1,000 Units by mouth daily.  [provider] Taking Active   clopidogrel (PLAVIX) 75 MG tablet [id number] Yes Take 75 mg by mouth daily. [provider] Taking Active Self  empagliflozin (JARDIANCE) 25 MG TABS tablet [id number] Yes Take 1 tablet (25 mg total) by mouth daily before breakfast. McLean-Scocuzza, Pasty Spillers, MD Taking Active   ezetimibe (ZETIA) 10 MG tablet [id number] Yes Take 1 tablet (10 mg total) by mouth daily. McLean-Scocuzza, Pasty Spillers, MD Taking Active   icosapent Ethyl (VASCEPA) 1 g capsule [id number] Yes Take 2 capsules (2 g total) by mouth 2 (two) times daily. McLean-Scocuzza, Pasty Spillers, MD Taking Active   lisinopril (ZESTRIL) 2.5 MG tablet [id number] Yes Take 1 tablet (2.5 mg total) by mouth daily. McLean-Scocuzza, Pasty Spillers, MD Taking Active   MAGNESIUM PO [id number] Yes Take by mouth daily. [provider] Taking Active   meclizine (ANTIVERT) 25 MG tablet [id number]  Take 25 mg by mouth 3 (three) times daily as needed for dizziness. [provider]  Active   metFORMIN (GLUCOPHAGE XR) 500 MG 24 hr tablet [id number] Yes Take 2 tablets (1,000 mg total) by mouth in  the morning and at bedtime. McLean-Scocuzza, Pasty Spillers, MD Taking Active   metoprolol tartrate (LOPRESSOR) 50 MG tablet [id number] Yes Take 1 tablet (50 mg total) by mouth 2 (two) times daily. McLean-Scocuzza, Pasty Spillers, MD Taking Active   neomycin-polymyxin-hydrocortisone (CORTISPORIN) OTIC solution [id number]  Place 3 drops into the left ear 4 (four) times daily. X up to 1 week prn left ear  McLean-Scocuzza, Pasty Spillers, MD  Active   Probiotic Product (PROBIOTIC DAILY PO) [id number] Yes Take by mouth. [provider] Taking Active   rosuvastatin (CRESTOR) 40 MG tablet [id number] Yes Take 1 tablet (40 mg total) by mouth at bedtime. McLean-Scocuzza, Pasty Spillers, MD Taking Active   Semaglutide (RYBELSUS) 7 MG TABS [id number] Yes Take by mouth. [provider] Taking Active   vitamin B-12 (CYANOCOBALAMIN) 500 MCG tablet [id number] Yes Take 5,000 mcg by mouth daily.  [provider] Taking Active Self          Patient Active Problem List   Diagnosis Date Noted  . History of gastrointestinal stromal tumor (GIST) 07/29/2020  . History of lumbosacral spine surgery 07/29/2020  . Low testosterone in male 05/14/2020  . Obesity (BMI 30.0-34.9) 02/26/2020  . Hypertension associated with diabetes (HCC) 01/29/2020  . Shoulder pain, right 01/27/2020  . Aortic atherosclerosis (HCC) 01/27/2020  . Emphysema of lung (HCC) 01/27/2020  . Hypogonadism in male 12/11/2019  . OSA on CPAP 09/19/2019  . Vertigo 09/17/2018  . 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 Hyperlipidemia 06/18/2018  . Excessive cerumen in both ear canals 05/16/2018  . 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 Elevated TSH 03/28/2018  . Lung nodule 03/28/2018  . CTS (carpal tunnel syndrome) 03/28/2018  . DM2 (diabetes mellitus, type 2) (HCC) 03/25/2018  . CAD (coronary artery disease) 03/25/2018  . Arthritis 03/25/2018  . Fatty liver 03/25/2018  . Vitamin D deficiency 03/25/2018  . Long term use of drug 01/23/2018  . Cervical spondylosis with myelopathy 11/21/2017  . Spinal stenosis of lumbar region at multiple levels 11/21/2017  . Personal history of tobacco use, presenting hazards to health 07/13/2015  . 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 Subendocardial myocardial infarction (HCC) 03/04/2014    Conditions to be  addressed/monitored: CAD, HTN, HLD and DMII  Patient Care Plan: Medication Management    Problem Identified: Diabetes, Hyperlipidemia     Long-Range Goal: Disease Progression Prevention   This Visit's Progress: On track  Recent Progress: On track  Priority: High  Note:   Current Barriers:  . Unable to independently afford treatment regimen . Unable to achieve control of diabetes   Pharmacist Clinical Goal(s):  Marland Kitchen Over the next 90 days, patient will verbalize ability to afford treatment regimen. . Over the next 90 days, patient will  achieve adherence to monitoring guidelines and medication adherence to achieve therapeutic efficacy. . Over the next 90 days, patient will maintain control of diabetes and hyperlipidemia as evidenced by labwork through collaboration with PharmD and provider.   Interventions: . 1:1 collaboration with McLean-Scocuzza, Pasty Spillers, MD regarding development and update of comprehensive plan of care as evidenced by provider attestation and co-signature . Inter-disciplinary care team collaboration (see longitudinal plan of care) . Comprehensive medication review performed; medication list updated in electronic medical record  Diabetes: . Unontrolled; current treatment: Rybelsus 7 mg daily (dose reduced d/t GI upset), metformin XR 1000 mg BID, Jardiance 25 mg daily . Patient notes that he had some stomach upset this morning,  but then noted that he had 2 ham biscuits for breakfast. Discussed principals of delayed gastric emptying w/ GLP1 and increased satiety, recommended to limit to 1 biscuit moving forward. Patient verbalized understanding . Discussed that Novo app for 2022 has been submitted for Rybelsus 7 mg daily. Patient approved for Jardiance through BI through 07/30/21. Marland Kitchen Continue current regimen at this time.   Hypertension: . Controlled per last clinic reading; current treatment: lisinopril 2.5 mg QAM, metoprolol tartrate 50 mg BID . Recommended to continue  current regimen . Will discuss home BP monitoring moving forward   Hyperlipidemia: . Controlled; current treatment: ezetimibe 10 mg daily, rosuvastatin 20 mg daily, Vascepa 2 g BID  o [employer] funding for Vascepa, rosuvastatin, and ezetimibe through 12/2020.  Marland Kitchen Recommended to continue current regimen . Could consider dose maximization of rosuvastatin 40 mg daily for optimized triglyceride lowering.   ASCVD Secondary Prevention, Antiplatelet: . Appropriate treatment, aspirin 81 mg daily w/ clopidogrel 75 mg daily. Follows w/ Dr. Darrold Junker. 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 Recommended to continue current regimen  Patient Goals/Self-Care Activities . Over the next 90 days, patient will:  - focus on medication adherence by collaborating with team on medication access solutions - check blood glucose BID, document, and provide at future appointments  Follow Up Plan: Telephone follow up appointment with care management team member scheduled for: ~ 8 weeks     Medication Assistance: Application for Novo (Rybelsus) medication assistance program in process. Anticipated assistance start date TBD. Patient approved for Jardiance assistance through BI through 07/30/21. Patient approved for hyperlipidemia funding through [employer] through 12/2020. See plan of care above for additional detail.   Plan: Telephone follow up appointment with care management team member scheduled for:~ 8 weeks  Catie Feliz Beam, PharmD, [place], CPP Clinical Pharmacist [employer] at [employer] [phone]

## 2020-08-06 ENCOUNTER — Telehealth: Payer: Self-pay | Admitting: Pharmacist

## 2020-08-06 NOTE — Telephone Encounter (Signed)
  Chronic Care Management   Note  08/06/2020 Name: Kevin Ryan MRN: [id number] DOB: 1951-01-05   Received voicemail from patient that he had paperwork for me to drop off. Unsure what he is referring to, as we have received appropriate paperwork for patient assistance application. Called patient back, LVM noting that he could drop off anything for me during clinic hours, but that he had been approved for Rybelsus assistance for 2022.   [name], PharmD, [place], [place] Clinical Pharmacist [employer] at [place]

## 2020-08-10 ENCOUNTER — Ambulatory Visit (INDEPENDENT_AMBULATORY_CARE_PROVIDER_SITE_OTHER): Payer: Medicare Other

## 2020-08-10 VITALS — Ht 72.99 in | Wt 228.0 lb

## 2020-08-10 DIAGNOSIS — Z0000 Encounter for general adult medical examination without abnormal findings: Secondary | ICD-10-CM | POA: Diagnosis not present

## 2020-08-10 NOTE — Patient Instructions (Addendum)
Kevin Ryan , Thank you for taking time to come for your Medicare Wellness Visit. I appreciate your ongoing commitment to your health goals. Please review the following plan we discussed and let me know if I can assist you in the future.   These are the goals we discussed:  Goals: Weight 202lb   This is a list of the screening recommended for you and due dates:  Health Maintenance  Topic Date Due  . COVID-19 Vaccine (1) 08/12/2020*  . Flu Shot  10/28/2020*  . Tetanus Vaccine  08/10/2021*  . Hemoglobin A1C  11/12/2020  . Complete foot exam   01/26/2021  . Eye exam for diabetics  02/15/2021  . Colon Cancer Screening  03/25/2025  .  Hepatitis C: One time screening is recommended by Center for Disease Control  (CDC) for  adults born from [birth year] through 1965.   Completed  . Pneumonia vaccines  Completed  *Topic was postponed. The date shown is not the original due date.   Immunizations Immunization History  Administered Date(s) Administered  . Influenza-Unspecified 05/01/2019  . Pneumococcal Conjugate-13 05/23/2016  . Pneumococcal Polysaccharide-23 06/17/2019  . Zoster 02/13/2017   Keep all routine maintenance appointments.   Next scheduled lab 01/11/21 @ 10:00  CCM 09/14/19 @ 3:30  Follow up 11/26/20 @ 2:30  Advanced directives: not yet completed  Conditions/risks identified: none new  Follow up in one year for your annual wellness visit.   Preventive Care 23 Years and Older, Male Preventive care refers to lifestyle choices and visits with your health care provider that can promote health and wellness. What does preventive care include?  A yearly physical exam. This is also called an annual well check.  Dental exams once or twice a year.  Routine eye exams. Ask your health care provider how often you should have your eyes checked.  Personal lifestyle choices, including:  Daily care of your teeth and gums.  Regular physical activity.  Eating a healthy  diet.  Avoiding tobacco and drug use.  Limiting alcohol use.  Practicing safe sex.  Taking low doses of aspirin every day.  Taking vitamin and mineral supplements as recommended by your health care provider. What happens during an annual well check? The services and screenings done by your health care provider during your annual well check will depend on your age, overall health, lifestyle risk factors, and family history of disease. Counseling  Your health care provider may ask you questions about your:  Alcohol use.  Tobacco use.  Drug use.  Emotional well-being.  Home and relationship well-being.  Sexual activity.  Eating habits.  History of falls.  Memory and ability to understand (cognition).  Work and work Statistician. Screening  You may have the following tests or measurements:  Height, weight, and BMI.  Blood pressure.  Lipid and cholesterol levels. These may be checked every 5 years, or more frequently if you are over 43 years old.  Skin check.  Lung cancer screening. You may have this screening every year starting at age 74 if you have a 30-pack-year history of smoking and currently smoke or have quit within the past 15 years.  Fecal occult blood test (FOBT) of the stool. You may have this test every year starting at age 78.  Flexible sigmoidoscopy or colonoscopy. You may have a sigmoidoscopy every 5 years or a colonoscopy every 10 years starting at age 104.  Prostate cancer screening. Recommendations will vary depending on your family history and other risks.  Hepatitis  C blood test.  Hepatitis B blood test.  Sexually transmitted disease (STD) testing.  Diabetes screening. This is done by checking your blood sugar (glucose) after you have not eaten for a while (fasting). You may have this done every 1-3 years.  Abdominal aortic aneurysm (AAA) screening. You may need this if you are a current or former smoker.  Osteoporosis. You may be screened  starting at age 78 if you are at high risk. Talk with your health care provider about your test results, treatment options, and if necessary, the need for more tests. Vaccines  Your health care provider may recommend certain vaccines, such as:  Influenza vaccine. This is recommended every year.  Tetanus, diphtheria, and acellular pertussis (Tdap, Td) vaccine. You may need a Td booster every 10 years.  Zoster vaccine. You may need this after age 57.  Pneumococcal 13-valent conjugate (PCV13) vaccine. One dose is recommended after age 48.  Pneumococcal polysaccharide (PPSV23) vaccine. One dose is recommended after age 64. Talk to your health care provider about which screenings and vaccines you need and how often you need them. This information is not intended to replace advice given to you by your health care provider. Make sure you discuss any questions you have with your health care provider. Document Released: 08/13/2015 Document Revised: 04/05/2016 Document Reviewed: 05/18/2015 Elsevier Interactive Patient Education  2017 [employer] Prevention in the Home Falls can cause injuries. They can happen to people of all ages. There are many things you can do to make your home safe and to help prevent falls. What can I do on the outside of my home?  Regularly fix the edges of walkways and driveways and fix any cracks.  Remove anything that might make you trip as you walk through a door, such as a raised step or threshold.  Trim any bushes or trees on the path to your home.  Use bright outdoor lighting.  Clear any walking paths of anything that might make someone trip, such as rocks or tools.  Regularly check to see if handrails are loose or broken. Make sure that both sides of any steps have handrails.  Any raised decks and porches should have guardrails on the edges.  Have any leaves, snow, or ice cleared regularly.  Use sand or salt on walking paths during  winter.  Clean up any spills in your garage right away. This includes oil or grease spills. What can I do in the bathroom?  Use night lights.  Install grab bars by the toilet and in the tub and shower. Do not use towel bars as grab bars.  Use non-skid mats or decals in the tub or shower.  If you need to sit down in the shower, use a plastic, non-slip stool.  Keep the floor dry. Clean up any water that spills on the floor as soon as it happens.  Remove soap buildup in the tub or shower regularly.  Attach bath mats securely with double-sided non-slip rug tape.  Do not have throw rugs and other things on the floor that can make you trip. What can I do in the bedroom?  Use night lights.  Make sure that you have a light by your bed that is easy to reach.  Do not use any sheets or blankets that are too big for your bed. They should not hang down onto the floor.  Have a firm chair that has side arms. You can use this for support while you get  dressed.  Do not have throw rugs and other things on the floor that can make you trip. What can I do in the kitchen?  Clean up any spills right away.  Avoid walking on wet floors.  Keep items that you use a lot in easy-to-reach places.  If you need to reach something above you, use a strong step stool that has a grab bar.  Keep electrical cords out of the way.  Do not use floor polish or wax that makes floors slippery. If you must use wax, use non-skid floor wax.  Do not have throw rugs and other things on the floor that can make you trip. What can I do with my stairs?  Do not leave any items on the stairs.  Make sure that there are handrails on both sides of the stairs and use them. Fix handrails that are broken or loose. Make sure that handrails are as long as the stairways.  Check any carpeting to make sure that it is firmly attached to the stairs. Fix any carpet that is loose or worn.  Avoid having throw rugs at the top or  bottom of the stairs. If you do have throw rugs, attach them to the floor with carpet tape.  Make sure that you have a light switch at the top of the stairs and the bottom of the stairs. If you do not have them, ask someone to add them for you. What else can I do to help prevent falls?  Wear shoes that:  Do not have high heels.  Have rubber bottoms.  Are comfortable and fit you well.  Are closed at the toe. Do not wear sandals.  If you use a stepladder:  Make sure that it is fully opened. Do not climb a closed stepladder.  Make sure that both sides of the stepladder are locked into place.  Ask someone to hold it for you, if possible.  Clearly mark and make sure that you can see:  Any grab bars or handrails.  First and last steps.  Where the edge of each step is.  Use tools that help you move around (mobility aids) if they are needed. These include:  Canes.  Walkers.  Scooters.  Crutches.  Turn on the lights when you go into a dark area. Replace any light bulbs as soon as they burn out.  Set up your furniture so you have a clear path. Avoid moving your furniture around.  If any of your floors are uneven, fix them.  If there are any pets around you, be aware of where they are.  Review your medicines with your doctor. Some medicines can make you feel dizzy. This can increase your chance of falling. Ask your doctor what other things that you can do to help prevent falls. This information is not intended to replace advice given to you by your health care provider. Make sure you discuss any questions you have with your health care provider. Document Released: 05/13/2009 Document Revised: 12/23/2015 Document Reviewed: 08/21/2014 Elsevier Interactive Patient Education  2017 [employer].

## 2020-08-10 NOTE — Progress Notes (Signed)
Subjective:   LIBERATO STANSBERY is a 70 y.o. male who presents for an Initial Medicare Annual Wellness Visit.  Review of Systems    No ROS.  Medicare Wellness Virtual Visit.  Cardiac Risk Factors include: advanced age (>82men, >72 women);diabetes mellitus;male gender;hypertension     Objective:    Today's Vitals   08/10/20 1331  Weight: 228 lb (103.4 kg)  Height: 6' 0.99" (1.854 m)   Body mass index is 30.09 kg/m.  Advanced Directives 08/10/2020 12/10/2019 07/02/2019 06/04/2019 04/11/2017 01/29/2017 03/09/2015  Does Patient Have a Medical Advance Directive? No No No No No No No  Would patient like information on creating a medical advance directive? No - Patient declined No - Patient declined No - Patient declined No - Patient declined - No - Patient declined Yes - Educational materials given  Some encounter information is confidential and restricted. Go to Review Flowsheets activity to see all data.    Current Medications (verified) Outpatient Encounter Medications as of 08/10/2020  Medication Sig  . aspirin 81 MG tablet Take 81 mg by mouth daily.  . cholecalciferol (VITAMIN D) 25 MCG (1000 UNIT) tablet Take 1,000 Units by mouth daily.   . clopidogrel (PLAVIX) 75 MG tablet Take 75 mg by mouth daily.  . empagliflozin (JARDIANCE) 25 MG TABS tablet Take 1 tablet (25 mg total) by mouth daily before breakfast.  . ezetimibe (ZETIA) 10 MG tablet Take 1 tablet (10 mg total) by mouth daily.  Marland Kitchen icosapent Ethyl (VASCEPA) 1 g capsule Take 2 capsules (2 g total) by mouth 2 (two) times daily.  Marland Kitchen lisinopril (ZESTRIL) 2.5 MG tablet Take 1 tablet (2.5 mg total) by mouth daily.  Marland Kitchen MAGNESIUM PO Take by mouth daily.  . meclizine (ANTIVERT) 25 MG tablet Take 25 mg by mouth 3 (three) times daily as needed for dizziness.  . metFORMIN (GLUCOPHAGE XR) 500 MG 24 hr tablet Take 2 tablets (1,000 mg total) by mouth in the morning and at bedtime.  . metoprolol tartrate (LOPRESSOR) 50 MG tablet Take 1 tablet (50 mg  total) by mouth 2 (two) times daily.  Marland Kitchen neomycin-polymyxin-hydrocortisone (CORTISPORIN) OTIC solution Place 3 drops into the left ear 4 (four) times daily. X up to 1 week prn left ear  . Probiotic Product (PROBIOTIC DAILY PO) Take by mouth.  . rosuvastatin (CRESTOR) 40 MG tablet Take 1 tablet (40 mg total) by mouth at bedtime.  . Semaglutide (RYBELSUS) 7 MG TABS Take by mouth.  . vitamin B-12 (CYANOCOBALAMIN) 500 MCG tablet Take 5,000 mcg by mouth daily.    No facility-administered encounter medications on file as of 08/10/2020.    Allergies (verified) Ibuprofen   History: Past Medical History:  Diagnosis Date  . Abnormal CT of the abdomen   . Anemia    decreased platelets in last blood work  . 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 CAD (coronary artery disease)    s/p MI  . CAD (coronary artery disease)    s/p stent x 1 2004   . Cancer (HCC)    spindle cell stomach   . 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 Gastric mass 04/12/2015   cancer; resected  . History of kidney stones   . Hypercholesterolemia   . Hypertension   . Myocardial infarction ([place]) 2004  . Personal history of tobacco use, presenting hazards to health 07/13/2015  . Pneumonia   . Sleep apnea    CPAP  . 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 Vertigo    none in approx 1 yr  . Wears dentures    partial upper  . Wears hearing aid in both ears    Past Surgical History:  Procedure Laterality Date  . BACK SURGERY     01/29/18 [facility] lower  back   . CARDIAC CATHETERIZATION  2004   1 stent  . CARPAL TUNNEL RELEASE Bilateral   . CATARACT EXTRACTION W/PHACO Left 06/04/2019   Procedure: CATARACT EXTRACTION PHACO AND INTRAOCULAR LENS PLACEMENT ([facility]) LEFT DIABETIC 01:08.3  18.6%  12.76;  Surgeon: Leandrew Koyanagi, MD;  Location: [place];  Service: Ophthalmology;  Laterality: Left;  diabetic - oral meds sleep apnea  . CATARACT EXTRACTION W/PHACO Right 07/02/2019   Procedure:  CATARACT EXTRACTION PHACO AND INTRAOCULAR LENS PLACEMENT ([place]) Right DIABETIC;  Surgeon: Leandrew Koyanagi, MD;  Location: [place];  Service: Ophthalmology;  Laterality: Right;  diabetic-oral med sleep apnea (CPAP)  . COLONOSCOPY WITH PROPOFOL N/A 03/26/2015   Procedure: COLONOSCOPY WITH PROPOFOL;  Surgeon: Josefine Class, MD;  Location: [facility] ENDOSCOPY;  Service: Endoscopy;  Laterality: N/A;  . KNEE SURGERY     TKR  . KNEE SURGERY     b/l partial knee Dr. Leanna Sato   . PARTIAL GASTRECTOMY     2016 [facility]   Family History  Problem Relation Age of Onset  . Diabetes Mellitus II Maternal Grandfather   . Heart disease Maternal Grandfather   . CAD Paternal Grandfather   . Cancer Paternal Grandfather        ? type  . Arthritis Mother   . Depression Mother   . Hearing loss Mother   . Diabetes Father   . Cancer Brother        throat smoker   . Hearing loss Maternal Grandmother   . Arthritis Maternal Grandmother    Social History   Socioeconomic History  . Marital status: Married    Spouse name: Not on file  . Number of children: Not on file  . Years of education: Not on file  . Highest education level: Not on file  Occupational History  . Not on file  Tobacco Use  . Smoking status: Former Smoker    Packs/day: 0.50    Years: 50.00    Pack years: 25.00    Types: Cigarettes  . Smokeless tobacco: Never Used  . Tobacco comment: 1 ppd or a little less (since age 68)  Vaping Use  . Vaping Use: Former  Substance and Sexual Activity  . Alcohol use: No    Comment: may have drink 3-4x/yr  . Drug use: No  . Sexual activity: Yes    Birth control/protection: None  Other Topics Concern  . Not on file  Social History Narrative   Owns guns    Wears seat belt    Safe in relationship    Married 2 sons    -married 65 years as of 09/2019    On disability    Assoc degree and prev supervisor    Retired as of 09/2019 end of month       Social Determinants of Health    Financial Resource Strain: Medium Risk  . Difficulty of Paying Living Expenses: Somewhat hard  Food Insecurity: No Food Insecurity  . Worried About Charity fundraiser in the Last Year: Never true  . Ran Out of Food in the Last Year: Never true  Transportation Needs: No Transportation Needs  . Lack  of Transportation (Medical): No  . Lack of Transportation (Non-Medical): No  Physical Activity: Not on file  Stress: No Stress Concern Present  . Feeling of Stress : Not at all  Social Connections: Unknown  . Frequency of Communication with Friends and Family: Not on file  . Frequency of Social Gatherings with Friends and Family: Not on file  . Attends Religious Services: Not on file  . Active Member of Clubs or Organizations: Not on file  . Attends Archivist Meetings: Not on file  . Marital Status: Married    Tobacco Counseling Counseling given: Not Answered Comment: 1 ppd or a little less (since age 34)   Clinical Intake:  Pre-visit preparation completed: Yes        Diabetes: No  How often do you need to have someone help you when you read instructions, pamphlets, or other written materials from your doctor or pharmacy?: 1 - Never   Interpreter Needed?: No      Activities of Daily Living In your present state of health, do you have any difficulty performing the following activities: 08/10/2020  Hearing? Y  Vision? N  Difficulty concentrating or making decisions? N  Walking or climbing stairs? Y  Dressing or bathing? N  Doing errands, shopping? N  Preparing Food and eating ? N  Using the Toilet? N  In the past six months, have you accidently leaked urine? N  Do you have problems with loss of bowel control? N  Managing your Medications? N  Managing your Finances? N  Housekeeping or managing your Housekeeping? N  Some recent data might be hidden    Patient Care Team: McLean-Scocuzza, Nino Glow, MD as PCP - General (Internal Medicine) De Hollingshead, RPH-CPP as Pharmacist (Pharmacist)  Indicate any recent Medical Services you may have received from other than [facility] providers in the past year (date may be approximate).     Assessment:   This is a routine wellness examination for Kiwan.  I connected with Bless today by telephone and verified that I am speaking with the correct person using two identifiers. Location patient: home Location provider: work Persons participating in the virtual visit: patient, Marine scientist.    I discussed the limitations, risks, security and privacy concerns of performing an evaluation and management service by telephone and the availability of in person appointments. The patient expressed understanding and verbally consented to this telephonic visit.    Interactive audio and video telecommunications were attempted between this provider and patient, however failed, due to patient having technical difficulties OR patient did not have access to video capability.  We continued and completed visit with audio only.  Some vital signs may be absent or patient reported.   Hearing/Vision screen  Hearing Screening   125Hz  250Hz  500Hz  1000Hz  2000Hz  3000Hz  4000Hz  6000Hz  8000Hz   Right ear:           Left ear:           Comments: Hearing aids  Vision Screening Comments: FVisual acuity not assessed, virtual visit.  They have seen their ophthalmologist in the last 12 months.     Dietary issues and exercise activities discussed: Current Exercise Habits: Home exercise routine, Intensity: Mild  Regular diet Good water intake  Goals: Weight 202lb  Depression Screen PHQ 2/9 Scores 08/10/2020 07/27/2020 01/27/2020 09/18/2019 05/13/2019  PHQ - 2 Score 0 0 0 0 0    Fall Risk Fall Risk  08/10/2020 07/27/2020 05/14/2020 01/27/2020 09/18/2019  Falls in the past year?  1 1 1 1  0  Number falls in past yr: - 0 0 1 0  Injury with Fall? - 0 0 1 0  Follow up - Falls evaluation completed Falls evaluation completed Falls  evaluation completed Falls evaluation completed    FALL RISK PREVENTION PERTAINING TO THE HOME: Handrails in use when climbing stairs? Yes Home free of loose throw rugs in walkways, pet beds, electrical cords, etc? Yes  Adequate lighting in your home to reduce risk of falls? Yes   ASSISTIVE DEVICES UTILIZED TO PREVENT FALLS: Use of leg braces? Yes   TIMED UP AND GO: Was the test performed? No . Virtual visit.   Cognitive Function:  Patient is alert and oriented x3.  Denies difficulty focusing, making decisions, memory loss.  Enjoys reading from time to time and playing golf.  MMSE/6CIT deferred. Normal by direct communication/conversation.      Immunizations Immunization History  Administered Date(s) Administered  . Influenza-Unspecified 05/01/2019  . Pneumococcal Conjugate-13 05/23/2016  . Pneumococcal Polysaccharide-23 06/17/2019  . Zoster 02/13/2017    TDAP status: Due, Education has been provided regarding the importance of this vaccine. Advised may receive this vaccine at local pharmacy or Health Dept. Aware to provide a copy of the vaccination record if obtained from local pharmacy or Health Dept. Verbalized acceptance and understanding. Deferred.   Health Maintenance Health Maintenance  Topic Date Due  . COVID-19 Vaccine (1) 08/12/2020 (Originally 08/01/1962)  . INFLUENZA VACCINE  10/28/2020 (Originally 02/29/2020)  . TETANUS/TDAP  08/10/2021 (Originally 08/01/1969)  . HEMOGLOBIN A1C  11/12/2020  . FOOT EXAM  01/26/2021  . OPHTHALMOLOGY EXAM  02/15/2021  . COLONOSCOPY (Pts 45-22yrs Insurance coverage will need to be confirmed)  03/25/2025  . Hepatitis C Screening  Completed  . PNA vac Low Risk Adult  Completed   Colorectal cancer screening: Type of screening: Colonoscopy. Completed 03/26/15. Repeat every 10 years  Lung Cancer Screening: Last completed 10/24/19.  Hepatitis C Screening: Completed 05/16/18.  Vision Screening: Recommended annual ophthalmology exams for  early detection of glaucoma and other disorders of the eye. Is the patient up to date with their annual eye exam?  Yes   Dental Screening: Recommended annual dental exams for proper oral hygiene. Dentures.   Community Resource Referral / Chronic Care Management: CRR required this visit?  No   CCM required this visit?  No      Plan:   Keep all routine maintenance appointments.   Next scheduled lab 01/11/21 @ 10:00  CCM 09/14/19 @ 3:30  Follow up 11/26/20 @ 2:30  I have personally reviewed and noted the following in the patient's chart:   . Medical and social history . Use of alcohol, tobacco or illicit drugs  . Current medications and supplements . Functional ability and status . Nutritional status . Physical activity . Advanced directives . List of other physicians . Hospitalizations, surgeries, and ER visits in previous 12 months . Vitals . Screenings to include cognitive, depression, and falls . Referrals and appointments  In addition, I have reviewed and discussed with patient certain preventive protocols, quality metrics, and best practice recommendations. A written personalized care plan for preventive services as well as general preventive health recommendations were provided to patient via mychart.     Varney Biles, LPN   [id number]

## 2020-08-11 ENCOUNTER — Telehealth: Payer: Medicare Other

## 2020-08-13 ENCOUNTER — Telehealth: Payer: Self-pay | Admitting: Internal Medicine

## 2020-08-13 MED ORDER — RYBELSUS 14 MG PO TABS: 7.0000 mg | ORAL_TABLET | Freq: Every day | ORAL | 4 refills | Status: DC

## 2020-08-13 NOTE — Telephone Encounter (Signed)
Received four boxes of Rybelsus 14 mg pills from Patient assistance. Updated in patient chart that he will be taking a half pill, 7 mg, by cutting the 14 mg in half.   For his next refill patient's medication will need to be changed to the 7 mg tablets per Dr [name].   Left message to return call to pick up medication. Labelled and placed upfront.

## 2020-08-13 NOTE — Telephone Encounter (Signed)
Patient called back and was informed. States that he still has a lot left over from his last refill.   Patient states that his wife tested positive for COVID. Patient states he is quarantined as well and not having any symptoms. Informed the Patient that he should be tested as well in 5 days. Patient declines being tested at this time. Informed him that should things get worse a positive test is required for treatment. Patient verbalized understanding

## 2020-09-08 ENCOUNTER — Telehealth: Payer: Self-pay | Admitting: Internal Medicine

## 2020-09-08 NOTE — Telephone Encounter (Signed)
2 boxes of Rybelsus 7 mg PAP supply provided to patient's wife.

## 2020-09-08 NOTE — Telephone Encounter (Signed)
Left message to return call.  Medication assistance has sent in the 7 mg tablets for Patient to take. According to Pharmacist Catie, cutting the Rybelsus 14 mg tablets in half can cause the medication to not be as effective. Due to one half being exposed to humidity and not take immediately.   She would like for the Patient to stop splitting the 14 in half and start the 7 mg tablet.

## 2020-09-10 NOTE — Telephone Encounter (Signed)
Noted  

## 2020-09-13 ENCOUNTER — Ambulatory Visit (INDEPENDENT_AMBULATORY_CARE_PROVIDER_SITE_OTHER): Payer: Medicare Other | Admitting: Pharmacist

## 2020-09-13 ENCOUNTER — Telehealth: Payer: Self-pay | Admitting: Internal Medicine

## 2020-09-13 DIAGNOSIS — E1159 Type 2 diabetes mellitus with other circulatory complications: Secondary | ICD-10-CM

## 2020-09-13 DIAGNOSIS — I2510 Atherosclerotic heart disease of native coronary artery without angina pectoris: Secondary | ICD-10-CM

## 2020-09-13 DIAGNOSIS — I152 Hypertension secondary to endocrine disorders: Secondary | ICD-10-CM | POA: Diagnosis not present

## 2020-09-13 DIAGNOSIS — E1165 Type 2 diabetes mellitus with hyperglycemia: Secondary | ICD-10-CM | POA: Diagnosis not present

## 2020-09-13 DIAGNOSIS — E782 Mixed hyperlipidemia: Secondary | ICD-10-CM | POA: Diagnosis not present

## 2020-09-13 NOTE — Telephone Encounter (Signed)
Spoke with patient.

## 2020-09-13 NOTE — Telephone Encounter (Signed)
Patient called stated that he was talking to you and needed to get back in touch with you could not reach you so I gave him your direct phone number

## 2020-09-13 NOTE — Chronic Care Management (AMB) (Signed)
Chronic Care Management Pharmacy Note  09/13/2020 Name:  Kevin Ryan MRN:  [id number] DOB:  1951-07-01  Subjective: Kevin Ryan is an 70 y.o. year old male who is a primary patient of McLean-Scocuzza, Nino Glow, MD.  The CCM team was consulted for assistance with disease management and care coordination needs.    Engaged with patient by telephone for follow up visit in response to provider referral for pharmacy case management and/or care coordination services.   Consent to Services:  The patient was given information about Chronic Care Management services, agreed to services, and gave verbal consent prior to initiation of services.  Please see initial visit note for detailed documentation.   Objective:  Lab Results  Component Value Date   CREATININE 0.91 05/14/2020   CREATININE 0.96 01/30/2020   CREATININE 0.88 10/14/2019    Lab Results  Component Value Date   HGBA1C 7.7 (H) 05/14/2020       Component Value Date/Time   CHOL 93 05/14/2020 1117   TRIG 181.0 (H) 05/14/2020 1117   HDL 29.90 (L) 05/14/2020 1117   CHOLHDL 3 05/14/2020 1117   VLDL 36.2 05/14/2020 1117   LDLCALC 27 05/14/2020 1117   LDLCALC 87 05/16/2018 0800   LDLDIRECT 52.0 01/30/2020 0918    BP Readings from Last 3 Encounters:  07/27/20 110/60  05/14/20 124/68  01/27/20 126/68    Assessment: Review of patient past medical history, allergies, medications, health status, including review of consultants reports, laboratory and other test data, was performed as part of comprehensive evaluation and provision of chronic care management services.   SDOH:  (Social Determinants of Health) assessments and interventions performed:  SDOH Interventions   Flowsheet Row Most Recent Value  SDOH Interventions   Financial Strain Interventions Other (Comment)  [medication assistance, healthwell]      CCM Care Plan  Allergies  Allergen Reactions  . Ibuprofen Itching and Rash    Medications Reviewed Today     Reviewed by De Hollingshead, RPH-CPP (Pharmacist) on 09/13/20 at 1533  Med List Status: <None>  Medication Order Taking? Sig Documenting Provider Last Dose Status Informant  aspirin 81 MG tablet [id number] Yes Take 81 mg by mouth daily. [provider] Taking Active Self  cholecalciferol (VITAMIN D) 25 MCG (1000 UNIT) tablet [id number] Yes Take 1,000 Units by mouth daily.  [provider] Taking Active   clopidogrel (PLAVIX) 75 MG tablet [id number] Yes Take 75 mg by mouth daily. [provider] Taking Active Self  empagliflozin (JARDIANCE) 25 MG TABS tablet [id number] Yes Take 1 tablet (25 mg total) by mouth daily before breakfast. McLean-Scocuzza, Nino Glow, MD Taking Active   ezetimibe (ZETIA) 10 MG tablet [id number] Yes Take 1 tablet (10 mg total) by mouth daily. McLean-Scocuzza, Nino Glow, MD Taking Active   icosapent Ethyl (VASCEPA) 1 g capsule [id number] Yes Take 2 capsules (2 g total) by mouth 2 (two) times daily. McLean-Scocuzza, Nino Glow, MD Taking Active   lisinopril (ZESTRIL) 2.5 MG tablet [id number] Yes Take 1 tablet (2.5 mg total) by mouth daily. McLean-Scocuzza, Nino Glow, MD Taking Active   MAGNESIUM PO [id number] Yes Take by mouth daily. [provider] Taking Active   meclizine (ANTIVERT) 25 MG tablet [id number] No Take 25 mg by mouth 3 (three) times daily as needed for dizziness.  Patient not taking: Reported on 09/13/2020   [provider] Not Taking Active   metFORMIN (GLUCOPHAGE XR) 500 MG 24 hr tablet [id number] Yes Take 2 tablets (1,000 mg total)  by mouth in the morning and at bedtime. McLean-Scocuzza, Nino Glow, MD Taking Active   metoprolol tartrate (LOPRESSOR) 50 MG tablet [id number] Yes Take 1 tablet (50 mg total) by mouth 2 (two) times daily. McLean-Scocuzza, Nino Glow, MD Taking Active   neomycin-polymyxin-hydrocortisone (CORTISPORIN) OTIC solution [id number] No Place 3 drops into the left ear 4 (four) times daily. X up to 1 week prn left ear   Patient not taking: Reported on 09/13/2020   McLean-Scocuzza, Nino Glow, MD Not Taking Active   Probiotic Product (PROBIOTIC DAILY PO) [id number] Yes Take by mouth. [provider] Taking Active   rosuvastatin (CRESTOR) 40 MG tablet [id number] Yes Take 1 tablet (40 mg total) by mouth at bedtime. McLean-Scocuzza, Nino Glow, MD Taking Active   Semaglutide [facility]) 14 MG TABS [id number] Yes Take 7 mg by mouth daily at 12 noon. Cut tablet in half. McLean-Scocuzza, Nino Glow, MD Taking Active   vitamin B-12 (CYANOCOBALAMIN) 500 MCG tablet [id number] Yes Take 5,000 mcg by mouth daily.  [provider] Taking Active Self          Patient Active Problem List   Diagnosis Date Noted  . History of gastrointestinal stromal tumor (GIST) 07/29/2020  . History of lumbosacral spine surgery 07/29/2020  . Low testosterone in male 05/14/2020  . Obesity (BMI 30.0-34.9) 02/26/2020  . Hypertension associated with diabetes ([place]) 01/29/2020  . Shoulder pain, right 01/27/2020  . Aortic atherosclerosis ([place]) 01/27/2020  . Emphysema of lung ([place]) 01/27/2020  . Hypogonadism in male 12/11/2019  . OSA on CPAP 09/19/2019  . Vertigo 09/17/2018  . 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 Hyperlipidemia 06/18/2018  . Excessive cerumen in both ear canals 05/16/2018  . 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 Elevated TSH 03/28/2018  . Lung nodule 03/28/2018  . CTS (carpal tunnel syndrome) 03/28/2018  . DM2 (diabetes mellitus, type 2) ([place]) 03/25/2018  . CAD (coronary artery disease) 03/25/2018  . Arthritis 03/25/2018  . Fatty liver 03/25/2018  . Vitamin D deficiency 03/25/2018  . Long term use of drug 01/23/2018  . Cervical spondylosis with myelopathy 11/21/2017  . Spinal stenosis of lumbar region at multiple levels 11/21/2017  . Personal history of tobacco use, presenting hazards to health 07/13/2015  . Malignant gastrointestinal stromal tumor (GIST) of stomach ([place])  05/07/2015  . 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 Unable to independently afford treatment regimen . Unable to achieve control of diabetes   Pharmacist Clinical Goal(s):  Marland Kitchen Over the next 90 days, patient will verbalize ability to afford treatment regimen. . Over the next 90 days, patient will  achieve adherence to monitoring guidelines and medication adherence to achieve therapeutic efficacy. . Over the next 90 days, patient will maintain control of diabetes and hyperlipidemia as evidenced by labwork through collaboration with PharmD and provider.   Interventions: . 1:1 collaboration with McLean-Scocuzza, Nino Glow, MD regarding development and update of comprehensive plan of care as evidenced by provider attestation and co-signature . Inter-disciplinary care team collaboration (see longitudinal plan of care) . Comprehensive medication review performed; medication list updated in electronic medical record  Health Maintenance: . COVID in December. Reports doing well.   Diabetes: . Uncontrolled; current treatment: Rybelsus 7 mg daily (max tolerated dose d/t GI upset), metformin XR 1000 mg BID, Jardiance 25 mg daily . Current glucose readings:   Fasting Bedtime  1-Feb 130   2-Feb 148   3-Feb 128   4-Feb 147   5-Feb 150   6-Feb 139 146  7-Feb 137   8-Feb 124   9-Feb 153   10-Feb 151   11-Feb 134   12-Feb 141   13-Feb 132   14-Feb 125    138.5 146   . Current meal patterns: normally eats 1-2 meals daily; reports a bigger appetite lately (since Rybelsus dose reduction); breakfast: country ham  today, sometimes skips, drinks sweet tea or water;  Supper; wife fixes supper, roast, potatoes; continues to endorse appetite reduction/taste changes with meals. Notes that he likes non starchy vegetables, steak/meats. Sometimes will make a salad (eggs, mushrooms, tomato)  . Reports total weight loss of ~ 30 lbs since starting on Rybelsus therapy.  . Current physical activity: limited by chronic pain; hip pain has bothered over the last month. Plans to start walking when the weather improves. Also loves to play golf, but shoulder pain limits ability to swing.  Marland Kitchen Approved for [employer] assistance and [employer] assistance for 2022 . Continue current regimen at this time.  Marland Kitchen Extensive dietary discussion. Patient suggested using Stevia to sweeten unsweet tea. Encouraged him to follow through on this. . Extensive discussion about increasing physical activity. Patient plans to try to walk more moving forward.  . Educated on goal A1c, goal fasting, and goal 2 hour post prandial glucose. Encouraged to start checking 2 hour post prandial readings.   Hypertension: . Controlled per last clinic reading; current treatment: lisinopril 2.5 mg QAM, metoprolol tartrate 50 mg BID . Recommended to continue current regimen . Will discuss home BP monitoring moving forward   Hyperlipidemia: . Controlled; current treatment: ezetimibe 10 mg daily, rosuvastatin 20 mg daily, Vascepa 2 g BID  o [employer] funding for Vascepa, rosuvastatin, and ezetimibe through 12/2020.  Marland Kitchen Recommended to continue current regimen.    ASCVD Secondary Prevention, Antiplatelet: . Appropriate treatment, aspirin 81 mg daily w/ clopidogrel 75 mg daily. Follows w/ Dr. Saralyn Pilar. 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 Recommended to continue current regimen  Chronic back/hip pain: . Uncontrolled; current regimen: acetaminophen 1000 mg BID, topical bengay cream . Hip pain - x-ray suggestive for  arthritis. Patient asks today about tramadol, cyclobenaprine. Denies hx lidocaine.  . Discussed OTC lidocaine patches. Avoid OTC diclofenac given hx rash w/ ibuprofen. Discussed referral to orthopedics. Patient will contemplate. . Discussed risk of CNS sedation with tramadol, muscle relaxers. Discussed avoidance of long term opioids d/t risk of addiction. Encouraged to stay as active as possible.    Patient Goals/Self-Care Activities . Over the next 90 days, patient will:  - focus on medication adherence by collaborating with team on medication access solutions - check blood glucose BID, document, and provide at future appointments  Follow Up Plan: Telephone follow up appointment with care management team member scheduled for: ~ 5 weeks     Medication Assistance: Rybelsus obtained through [employer] medication assistance program.  Enrollment ends 06/29/21. [place]   Follow Up:  Patient agrees to Care Plan and Follow-up.  Plan: Telephone follow up appointment with care management team member scheduled for:  ~5 weeks  Catie  Darnelle Maffucci, PharmD, [place], [place] Clinical Pharmacist [employer] at [place]

## 2020-09-13 NOTE — Patient Instructions (Signed)
Visit Information  PATIENT GOALS: Goals Addressed              This Visit's Progress     Patient Stated   .  Medication Management (pt-stated)        Patient Goals/Self-Care Activities . Over the next 90 days, patient will:  - focus on medication adherence by collaborating with team on medication access solutions - check blood glucose BID, document, and provide at future appointments         Patient verbalizes understanding of instructions provided today and agrees to view in [place].   Plan: Telephone follow up appointment with care management team member scheduled for:  ~5 weeks  [name], PharmD, [place], [place] Clinical Pharmacist [employer] at [employer] [phone]

## 2020-09-21 IMAGING — CT CT HEAD W/O CM
3 of 4 series · 14 of 47 positions shown, 16 images · non-contrast
Comparison: 01/01/2014

CLINICAL DATA: Central vertigo, dizziness, loss of balance, left
foot drop, history of gastric cancer status post surgical resection

EXAM:
CT HEAD WITHOUT CONTRAST
TECHNIQUE: Contiguous axial images were obtained from the base of the skull
through the vertex without intravenous contrast.

[Series 2: axial st head 5.00 ax · axial · 0.39mm/px · z∈[-592,-477]mm · 8 of 27 slices shown, 10 images]
[im 2/27  brain]
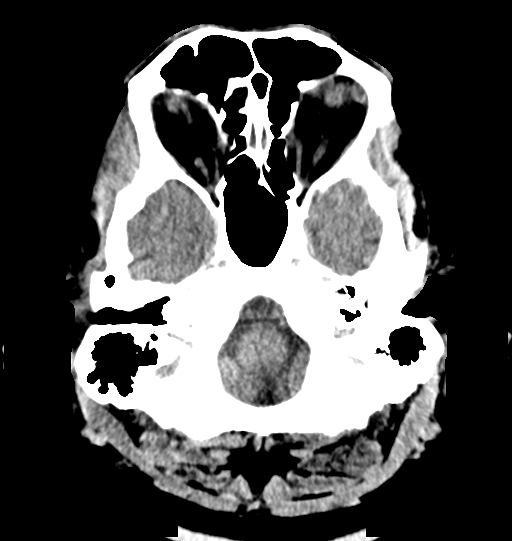
[im 2/27  bone]
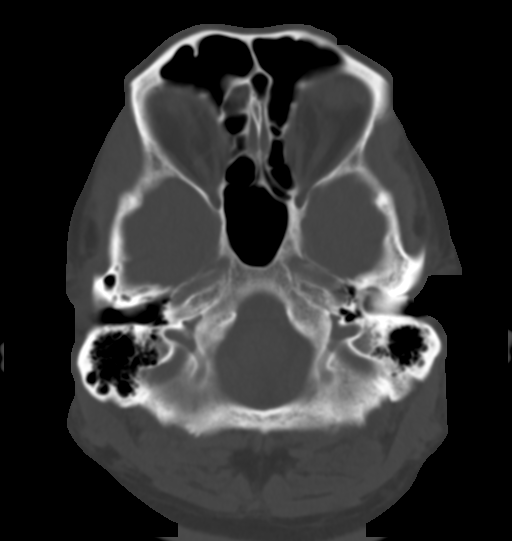
[im 6/27  brain]
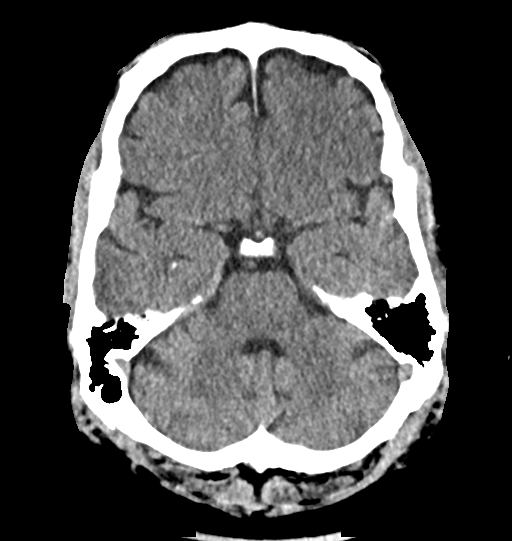
[im 10/27  brain]
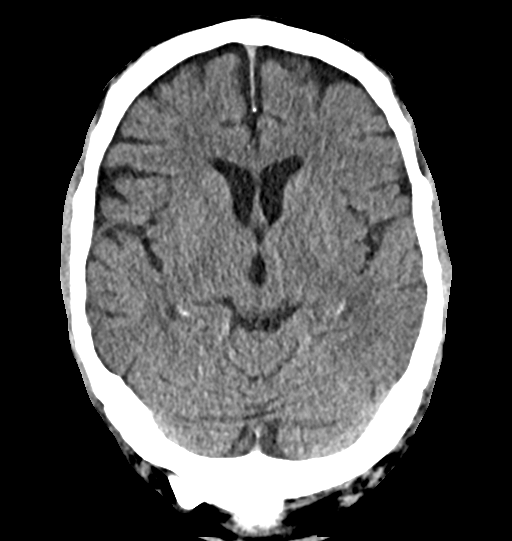
[im 12/27  brain]
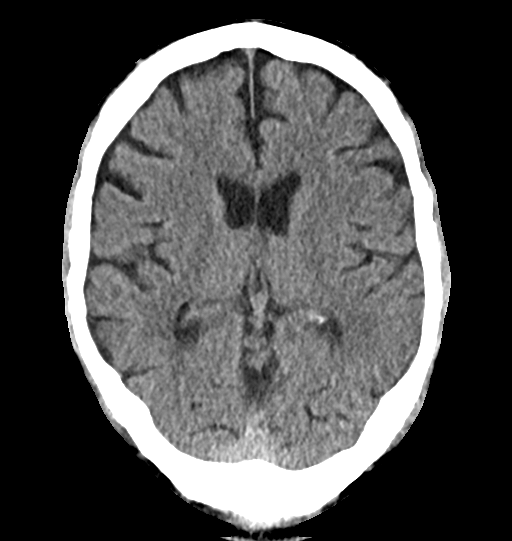
[im 15/27  brain]
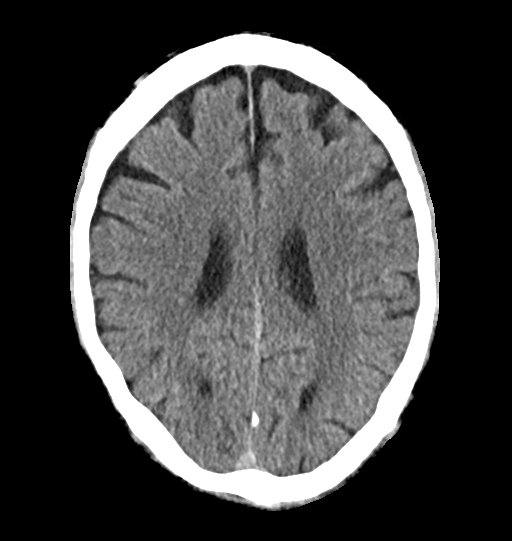
[im 15/27  bone]
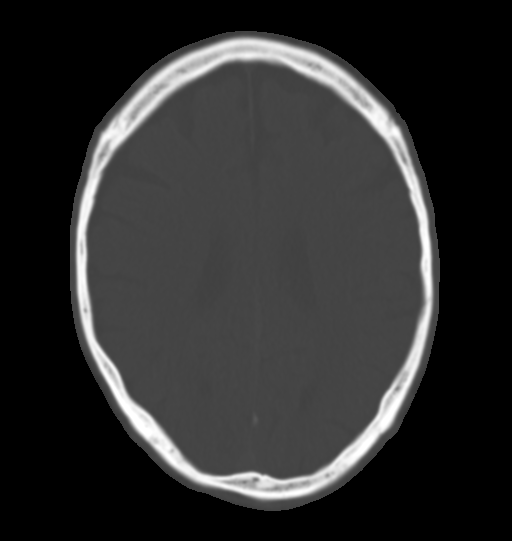
[im 17/27  brain]
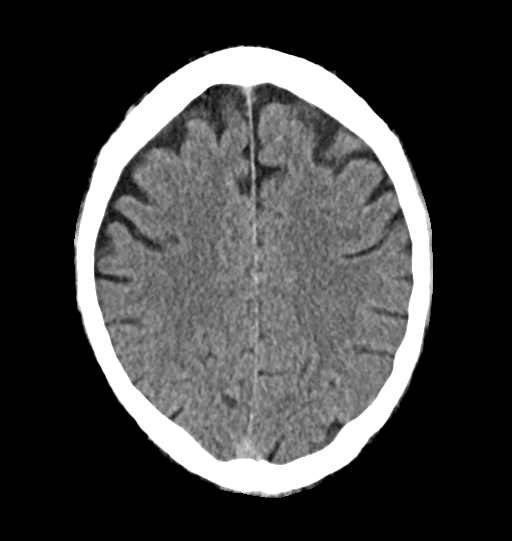
[im 21/27  brain]
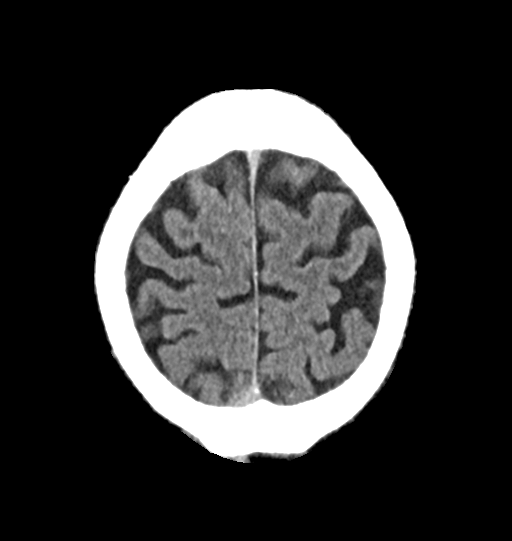
[im 25/27  brain]
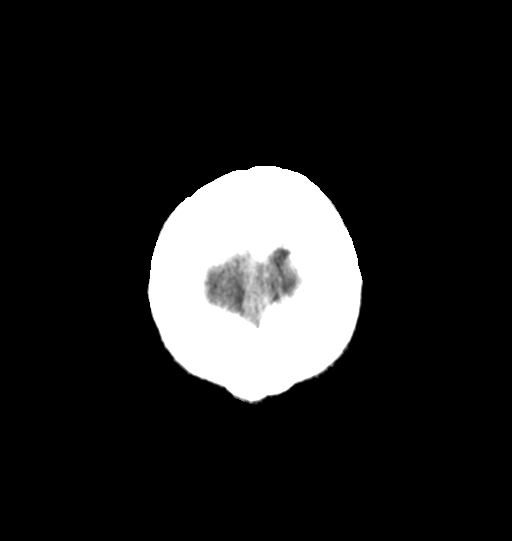

[Series 6: coronals head 3.00 cor · coronal · 0.26mm/px · 3 of 70 slices shown]
[im 24/70  brain]
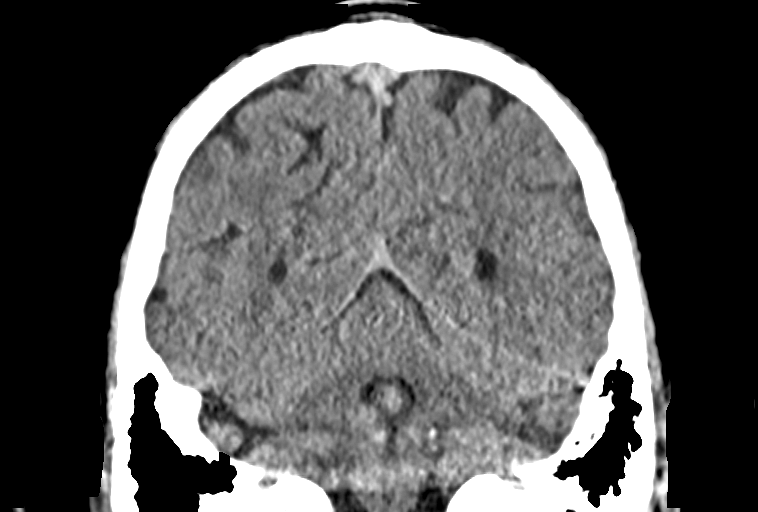
[im 31/70  brain]
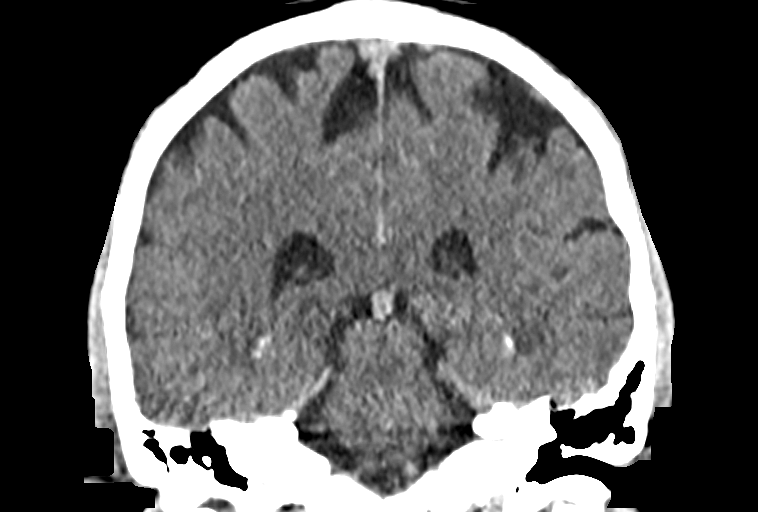
[im 39/70  brain]
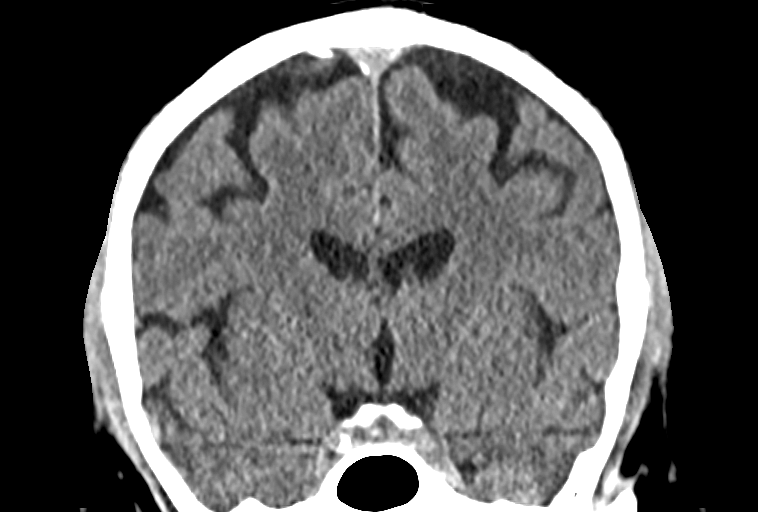

[Series 8: sagittals head 3.00 sag · sagittal · 0.26mm/px · 3 of 66 slices shown]
[im 22/66  brain]
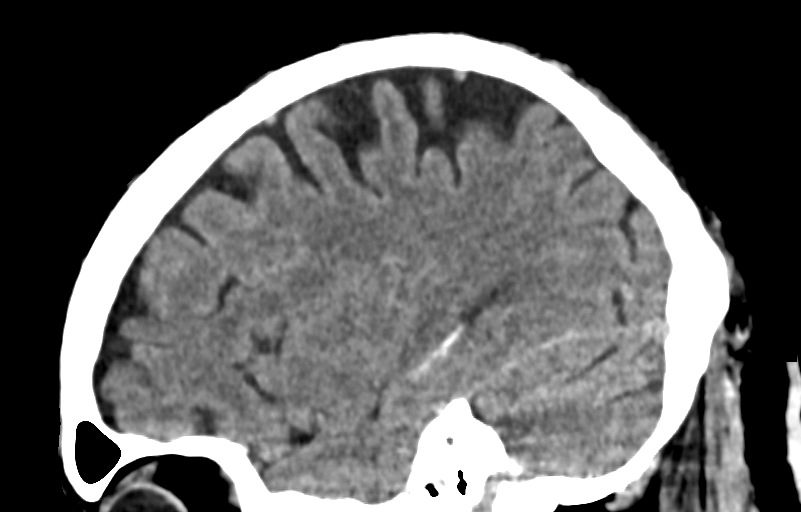
[im 33/66  brain]
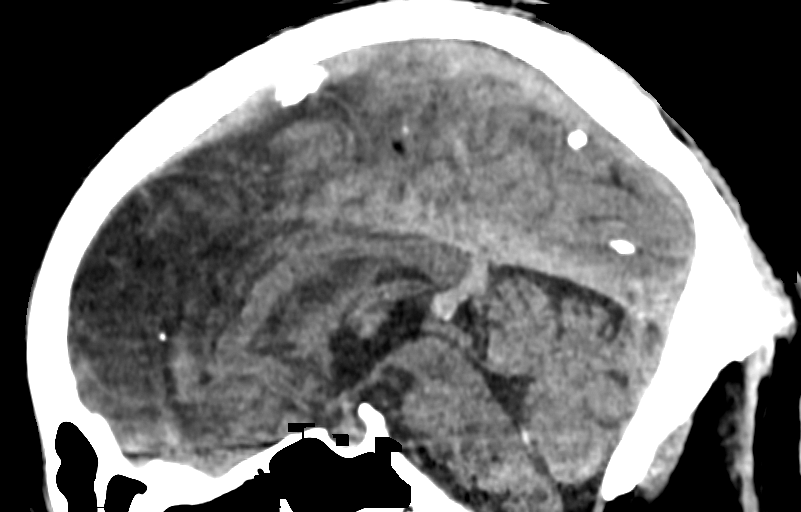
[im 44/66  brain]
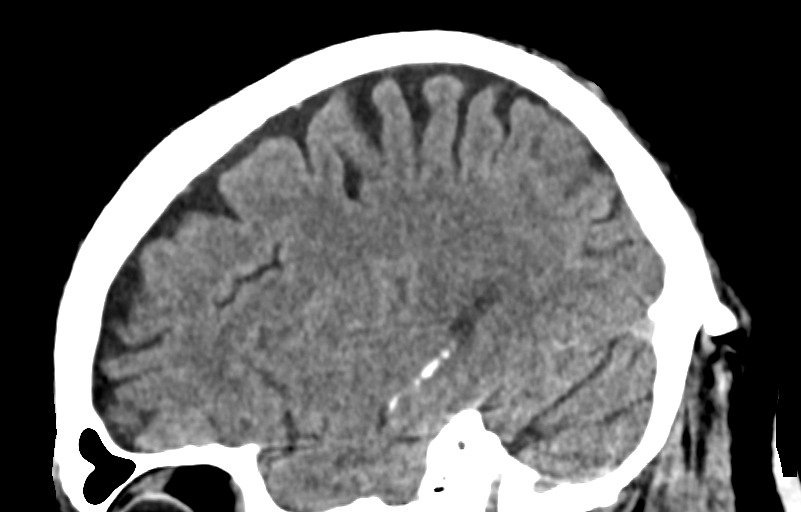

[14 of 47 positions shown; findings below may reference images not displayed]

FINDINGS: Brain: No acute infarct or hemorrhage. Lateral ventricles and
midline structures are unremarkable. No acute extra-axial fluid
collections. No mass effect.

Vascular: No hyperdense vessel or unexpected calcification.

Skull: Normal. Negative for fracture or focal lesion.

Sinuses/Orbits: No acute finding.

Other: None
IMPRESSION: 1. No acute intracranial process.

## 2020-09-27 ENCOUNTER — Telehealth: Payer: Self-pay | Admitting: Internal Medicine

## 2020-09-27 NOTE — Telephone Encounter (Signed)
Please advise on referral

## 2020-09-27 NOTE — Telephone Encounter (Signed)
Patient called and said both of his hips are hurting. He saw Dr. Olivia Mackie back in January about this issue. He is still in severe pain and needs to be seen. Patient feels he doesn't need to seen by Dr. Olivia Mackie, he needs a referral to Dr. Gaynelle Arabian, [place], [phone]. Patient is self medicating the pain at home with over the counter medication and some of his wife's old pain medication.

## 2020-09-28 ENCOUNTER — Other Ambulatory Visit: Payer: Self-pay | Admitting: Internal Medicine

## 2020-09-28 DIAGNOSIS — I10 Essential (primary) hypertension: Secondary | ICD-10-CM

## 2020-09-28 DIAGNOSIS — E119 Type 2 diabetes mellitus without complications: Secondary | ICD-10-CM

## 2020-09-29 NOTE — Telephone Encounter (Signed)
Referral placed.

## 2020-09-29 NOTE — Addendum Note (Signed)
Addended by: [name] on: 09/29/2020 12:35 PM   Modules accepted: Orders

## 2020-10-01 ENCOUNTER — Telehealth: Payer: Self-pay | Admitting: Pharmacist

## 2020-10-01 ENCOUNTER — Other Ambulatory Visit: Payer: Self-pay | Admitting: Internal Medicine

## 2020-10-01 DIAGNOSIS — M25559 Pain in unspecified hip: Secondary | ICD-10-CM

## 2020-10-01 MED ORDER — MELOXICAM 7.5 MG PO TABS: 7.5000 mg | ORAL_TABLET | Freq: Two times a day (BID) | ORAL | 0 refills | Status: DC | PRN

## 2020-10-01 NOTE — Telephone Encounter (Signed)
  Chronic Care Management   Note  10/01/2020 Name: Kevin Ryan MRN: [id number] DOB: 27-Apr-1951   Attempted to contact patient in response to voicemail he left me. Reason for call was not stated. Left HIPAA compliant message for patient to return my call at their convenience.    Plan: - If I do not hear back from the patient by end of business today, will attempt call back on Monday   [name], PharmD, [place], [place] Clinical Pharmacist [employer] at [employer] [phone]

## 2020-10-01 NOTE — Telephone Encounter (Signed)
Patient requesting Meloxicam be sent in for him as well. States that this and tylenol have been keeping him going as far as pain.   Please advise

## 2020-10-01 NOTE — Telephone Encounter (Signed)
Patient informed and verbalized understanding

## 2020-10-01 NOTE — Telephone Encounter (Signed)
Patient was calling to find out what prescription was sent for him. Reviewed that PCP sent script for meloxicam and that the referral was placed for ortho. He verbalized understanding.

## 2020-10-01 NOTE — Telephone Encounter (Signed)
Received and sent. 

## 2020-10-06 ENCOUNTER — Telehealth: Payer: Self-pay

## 2020-10-08 ENCOUNTER — Telehealth: Payer: Self-pay | Admitting: Licensed Clinical Social Worker

## 2020-10-08 NOTE — Telephone Encounter (Signed)
Spoke to patient and he was not ready to schedule his lung screening do to personal issues in his life. Patient said he would call when he is ready.

## 2020-10-11 ENCOUNTER — Other Ambulatory Visit: Payer: Self-pay | Admitting: Internal Medicine

## 2020-10-11 DIAGNOSIS — E785 Hyperlipidemia, unspecified: Secondary | ICD-10-CM

## 2020-10-15 ENCOUNTER — Ambulatory Visit: Payer: Medicare Other | Admitting: Pharmacist

## 2020-10-15 DIAGNOSIS — I2510 Atherosclerotic heart disease of native coronary artery without angina pectoris: Secondary | ICD-10-CM

## 2020-10-15 DIAGNOSIS — E1165 Type 2 diabetes mellitus with hyperglycemia: Secondary | ICD-10-CM

## 2020-10-15 DIAGNOSIS — E782 Mixed hyperlipidemia: Secondary | ICD-10-CM

## 2020-10-15 DIAGNOSIS — I152 Hypertension secondary to endocrine disorders: Secondary | ICD-10-CM

## 2020-10-15 DIAGNOSIS — E1159 Type 2 diabetes mellitus with other circulatory complications: Secondary | ICD-10-CM

## 2020-10-15 NOTE — Patient Instructions (Signed)
Visit Information  PATIENT GOALS: Goals Addressed              This Visit's Progress     Patient Stated   .  Medication Management (pt-stated)        Patient Goals/Self-Care Activities . Over the next 90 days, patient will:  - focus on medication adherence by collaborating with team on medication access solutions - check blood glucose twice daily, document, and provide at future appointments          Patient verbalizes understanding of instructions provided today and agrees to view in [place].   Plan: Telephone follow up appointment with care management team member scheduled for:  ~ 2 weeks as previously scheduled  [name], PharmD, [place], [place] Clinical Pharmacist [employer] at [facility] [phone]

## 2020-10-15 NOTE — Chronic Care Management (AMB) (Signed)
Chronic Care Management Pharmacy Note  10/15/2020 Name:  Kevin [place] MRN:  [id number] DOB:  [birth year]  Subjective: DAIVIK OVERLEY is an 70 y.o. year old male who is a primary patient of McLean-Scocuzza, Nino Glow, MD.  The CCM team was consulted for assistance with disease management and care coordination needs.    Care coordination for medication access in response to provider referral for pharmacy case management and/or care coordination services.   Consent to Services:  The patient was given information about Chronic Care Management services, agreed to services, and gave verbal consent prior to initiation of services.  Please see initial visit note for detailed documentation.   Patient Care Team: McLean-Scocuzza, Nino Glow, MD as PCP - General (Internal Medicine) De Hollingshead, RPH-CPP as Pharmacist (Pharmacist)  Recent office visits: None since our last appointment  Recent consult visits: None since our last appointment  Hospital visits: None in previous 6 months  Objective:  Lab Results  Component Value Date   CREATININE 0.91 05/14/2020   CREATININE 0.96 01/30/2020   CREATININE 0.88 10/14/2019    Lab Results  Component Value Date   HGBA1C 7.7 (H) 05/14/2020   Last diabetic Eye exam:  Lab Results  Component Value Date/Time   HMDIABEYEEXA No Retinopathy 02/16/2020 12:00 AM    Last diabetic Foot exam: No results found for: HMDIABFOOTEX      Component Value Date/Time   CHOL 93 05/14/2020 1117   TRIG 181.0 (H) 05/14/2020 1117   HDL 29.90 (L) 05/14/2020 1117   CHOLHDL 3 05/14/2020 1117   VLDL 36.2 05/14/2020 1117   LDLCALC 27 05/14/2020 1117   LDLCALC 87 05/16/2018 0800   LDLDIRECT 52.0 01/30/2020 0918    Hepatic Function Latest Ref Rng & Units 05/14/2020 01/30/2020 10/14/2019  Total Protein 6.0 - 8.3 g/dL 7.4 7.5 7.5  Albumin 3.5 - 5.2 g/dL 4.5 4.6 4.3  AST 0 - 37 U/L '17 30 27  ' ALT 0 - 53 U/L 18 30 33  Alk Phosphatase 39 - 117 U/L 77 84 76  Total  Bilirubin 0.2 - 1.2 mg/dL 0.7 0.5 0.5    Lab Results  Component Value Date/Time   TSH 1.64 05/14/2020 11:17 AM   TSH 2.47 06/17/2019 09:30 AM    CBC Latest Ref Rng & Units 05/14/2020 01/30/2020 10/14/2019  WBC 4.0 - 10.5 K/uL 10.1 8.6 8.4  Hemoglobin 13.0 - 17.0 g/dL 15.7 15.9 16.5  Hematocrit 39.0 - 52.0 % 47.2 47.1 48.8  Platelets 150.0 - 400.0 K/uL 146.0(L) 143.0(L) 133.0(L)    Lab Results  Component Value Date/Time   VD25OH 51.10 06/17/2019 09:30 AM    Clinical ASCVD: Yes    Social History   Tobacco Use  Smoking Status Former Smoker  . Packs/day: 0.50  . Years: 50.00  . Pack years: 25.00  . Types: Cigarettes  Smokeless Tobacco Never Used  Tobacco Comment   1 ppd or a little less (since age 66)   BP Readings from Last 3 Encounters:  07/27/20 110/60  05/14/20 124/68  01/27/20 126/68   Pulse Readings from Last 3 Encounters:  07/27/20 (!) 111  05/14/20 87  01/27/20 90   Wt Readings from Last 3 Encounters:  08/10/20 228 lb (103.4 kg)  07/27/20 228 lb 9.6 oz (103.7 kg)  05/14/20 237 lb 12.8 oz (107.9 kg)    Assessment: Review of patient past medical history, allergies, medications, health status, including review of consultants reports, laboratory and other test data, was performed as part  of comprehensive evaluation and provision of chronic care management services.   SDOH:  (Social Determinants of Health) assessments and interventions performed:  SDOH Interventions   Flowsheet Row Most Recent Value  SDOH Interventions   Financial Strain Interventions Other (Comment)  [manufacturer assistance]      CCM Care Plan  Allergies  Allergen Reactions  . Ibuprofen Itching and Rash    Medications Reviewed Today    Reviewed by De Hollingshead, RPH-CPP (Pharmacist) on 09/13/20 at 1533  Med List Status: <None>  Medication Order Taking? Sig Documenting Provider Last Dose Status Informant  aspirin 81 MG tablet [id number] Yes Take 81 mg by mouth daily. [provider] Taking Active Self  cholecalciferol (VITAMIN D) 25 MCG (1000 UNIT) tablet [id number] Yes Take 1,000 Units by mouth daily.  [provider] Taking Active   clopidogrel (PLAVIX) 75 MG tablet [id number] Yes Take 75 mg by mouth daily. [provider] Taking Active Self  empagliflozin (JARDIANCE) 25 MG TABS tablet [id number] Yes Take 1 tablet (25 mg total) by mouth daily before breakfast. McLean-Scocuzza, Nino Glow, MD Taking Active   ezetimibe (ZETIA) 10 MG tablet [id number] Yes Take 1 tablet (10 mg total) by mouth daily. McLean-Scocuzza, Nino Glow, MD Taking Active   icosapent Ethyl (VASCEPA) 1 g capsule [id number] Yes Take 2 capsules (2 g total) by mouth 2 (two) times daily. McLean-Scocuzza, Nino Glow, MD Taking Active   lisinopril (ZESTRIL) 2.5 MG tablet [id number] Yes Take 1 tablet (2.5 mg total) by mouth daily. McLean-Scocuzza, Nino Glow, MD Taking Active   MAGNESIUM PO [id number] Yes Take by mouth daily. [provider] Taking Active   meclizine (ANTIVERT) 25 MG tablet [id number] No Take 25 mg by mouth 3 (three) times daily as needed for dizziness.  Patient not taking: Reported on 09/13/2020   [provider] Not Taking Active   metFORMIN (GLUCOPHAGE XR) 500 MG 24 hr tablet [id number] Yes Take 2 tablets (1,000 mg total) by mouth in the morning and at bedtime. McLean-Scocuzza, Nino Glow, MD Taking Active   metoprolol tartrate (LOPRESSOR) 50 MG tablet [id number] Yes Take 1 tablet (50 mg total) by mouth 2 (two) times daily. McLean-Scocuzza, Nino Glow, MD Taking Active   neomycin-polymyxin-hydrocortisone (CORTISPORIN) OTIC solution [id number] No Place 3 drops into the left ear 4 (four) times daily. X up to 1 week prn left ear  Patient not taking: Reported on 09/13/2020   McLean-Scocuzza, Nino Glow, MD Not Taking Active   Probiotic Product (PROBIOTIC DAILY PO) [id number] Yes Take by mouth. [provider] Taking Active   rosuvastatin (CRESTOR) 40 MG tablet [id number] Yes  Take 1 tablet (40 mg total) by mouth at bedtime. McLean-Scocuzza, Nino Glow, MD Taking Active   Semaglutide [facility]) 14 MG TABS [id number] Yes Take 7 mg by mouth daily at 12 noon. Cut tablet in half. McLean-Scocuzza, Nino Glow, MD Taking Active   vitamin B-12 (CYANOCOBALAMIN) 500 MCG tablet [id number] Yes Take 5,000 mcg by mouth daily.  [provider] Taking Active Self          Patient Active Problem List   Diagnosis Date Noted  . History of gastrointestinal stromal tumor (GIST) 07/29/2020  . History of lumbosacral spine surgery 07/29/2020  . Low testosterone in male 05/14/2020  . Obesity (BMI 30.0-34.9) 02/26/2020  . Hypertension associated with diabetes ([place]) 01/29/2020  . Shoulder pain, right 01/27/2020  . Aortic atherosclerosis ([place]) 01/27/2020  . Emphysema of lung ([place]) 01/27/2020  . Hypogonadism in male 12/11/2019  . OSA on  CPAP 09/19/2019  . Vertigo 09/17/2018  . 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[place]) 09/17/2018  . Hyperlipidemia 06/18/2018  . Excessive cerumen in both ear canals 05/16/2018  . 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 Elevated TSH 03/28/2018  . Lung nodule 03/28/2018  . CTS (carpal tunnel syndrome) 03/28/2018  . DM2 (diabetes mellitus, type 2) ([place]) 03/25/2018  . CAD (coronary artery disease) 03/25/2018  . Arthritis 03/25/2018  . Fatty liver 03/25/2018  . Vitamin D deficiency 03/25/2018  . Long term use of drug 01/23/2018  . Cervical spondylosis with myelopathy 11/21/2017  . Spinal stenosis of lumbar region at multiple levels 11/21/2017  . Personal history of tobacco use, presenting hazards to health 07/13/2015  . 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 Influenza-Unspecified 05/01/2019  . Pneumococcal Conjugate-13 05/23/2016  . Pneumococcal Polysaccharide-23 06/17/2019  .  Zoster 02/13/2017    Conditions to be addressed/monitored: CAD, HTN, HLD and DMII  Care Plan : Medication Management  Updates made by De Hollingshead, RPH-CPP since 10/15/2020 12:00 AM    Problem: Diabetes, Hyperlipidemia     Long-Range Goal: Disease Progression Prevention   This Visit's Progress: On track  Recent Progress: On track  Priority: High  Note:   Current Barriers:  . Unable to independently afford treatment regimen . Unable to achieve control of diabetes   Pharmacist Clinical Goal(s):  Marland Kitchen Over the next 90 days, patient will verbalize ability to afford treatment regimen. . Over the next 90 days, patient will  achieve adherence to monitoring guidelines and medication adherence to achieve therapeutic efficacy. . Over the next 90 days, patient will maintain control of diabetes and hyperlipidemia as evidenced by labwork through collaboration with PharmD and provider.   Interventions: . 1:1 collaboration with McLean-Scocuzza, Nino Glow, MD regarding development and update of comprehensive plan of care as evidenced by provider attestation and co-signature . Inter-disciplinary care team collaboration (see longitudinal plan of care) . Comprehensive medication review performed; medication list updated in electronic medical record    Diabetes: . Uncontrolled; current treatment: Rybelsus 7 mg daily (max tolerated dose d/t GI upset), metformin XR 1000 mg BID, Jardiance 25 mg daily . Faxed refill request for Rybelsus to [employer].   Hypertension: . Controlled per last clinic reading; current treatment: lisinopril 2.5 mg QAM, metoprolol tartrate 50 mg BID . Previously recommended to continue current regimen. Will discuss home BP monitoring moving forward   Hyperlipidemia: . Controlled; current treatment: ezetimibe 10 mg daily, rosuvastatin 20 mg daily, Vascepa 2 g BID  o [employer] funding for Vascepa, rosuvastatin, and ezetimibe through 12/2020.  Marland Kitchen Previously recommended to  continue current regimen.   ASCVD Secondary Prevention, Antiplatelet: . Appropriate treatment, aspirin 81 mg daily w/ clopidogrel 75 mg daily. Follows w/ Dr. Saralyn Pilar. 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 Previously recommended to continue current regimen  Chronic back/hip pain: . Uncontrolled; current regimen: acetaminophen 1000 mg BID, topical bengay cream . Hip pain - x-ray suggestive for arthritis. Patient asks today about tramadol, cyclobenaprine. Denies hx lidocaine.  . Previously discussed OTC lidocaine patches. Avoid OTC diclofenac given hx rash w/ ibuprofen. Discussed referral to orthopedics. Patient will contemplate. . Previously discussed risk of CNS sedation with tramadol, muscle relaxers. Discussed avoidance of long term opioids d/t risk of addiction. Encouraged to stay as active as possible.  Patient Goals/Self-Care Activities . Over the next 90 days, patient will:  - focus on medication adherence by collaborating with team on medication access solutions - check blood glucose BID, document, and provide at future appointments  Follow Up Plan: Telephone follow up appointment with care management team member scheduled for: ~ 2 weeks as previously scheduled     Medication Assistance: Rybelsus obtained through [employer] medication assistance program.  Enrollment ends 06/29/21. [employer] funding for cholesterol medications through 12/2020  Patient's preferred pharmacy is:  [facility] [place] [facility] [address] [place] [address] Phone: [phone] Fax: [phone]    Follow Up:  Patient agrees to Care Plan and Follow-up.  Plan: Telephone follow up appointment with care management team member scheduled for:  ~ 2 weeks as previously scheduled  [name], PharmD, [place], [place] Clinical Pharmacist [employer] at [facility] [phone]

## 2020-10-25 ENCOUNTER — Telehealth: Payer: Self-pay | Admitting: Pharmacy Technician

## 2020-10-25 DIAGNOSIS — Z596 Low income: Secondary | ICD-10-CM

## 2020-10-25 NOTE — Progress Notes (Signed)
[facility] [place] [facility])                                            [place] Team    10/25/2020  HELDER CRISAFULLI May 15, 1951 [id number]  Care coordination call placed to [place] in regards to delivery status of patient's Rybelsus 7mg  order.  Spoke to [place] who informed the refill order was fulfilled on 10/19/20 and should arrive to the provider's office in the next 10-15 business days.  Will route message to embedded PharmD as Juluis Rainier.  Lafonda Patron P. Renelda Kilian, [place]  [phone]

## 2020-10-27 ENCOUNTER — Ambulatory Visit (INDEPENDENT_AMBULATORY_CARE_PROVIDER_SITE_OTHER): Payer: Medicare Other | Admitting: Pharmacist

## 2020-10-27 ENCOUNTER — Telehealth: Payer: Self-pay | Admitting: *Deleted

## 2020-10-27 DIAGNOSIS — E782 Mixed hyperlipidemia: Secondary | ICD-10-CM

## 2020-10-27 DIAGNOSIS — E119 Type 2 diabetes mellitus without complications: Secondary | ICD-10-CM | POA: Diagnosis not present

## 2020-10-27 DIAGNOSIS — E1159 Type 2 diabetes mellitus with other circulatory complications: Secondary | ICD-10-CM

## 2020-10-27 DIAGNOSIS — I2510 Atherosclerotic heart disease of native coronary artery without angina pectoris: Secondary | ICD-10-CM

## 2020-10-27 DIAGNOSIS — E785 Hyperlipidemia, unspecified: Secondary | ICD-10-CM | POA: Diagnosis not present

## 2020-10-27 DIAGNOSIS — I152 Hypertension secondary to endocrine disorders: Secondary | ICD-10-CM

## 2020-10-27 DIAGNOSIS — E1165 Type 2 diabetes mellitus with hyperglycemia: Secondary | ICD-10-CM

## 2020-10-27 MED ORDER — ROSUVASTATIN CALCIUM 40 MG PO TABS: 40.0000 mg | ORAL_TABLET | Freq: Every day | ORAL | 1 refills | Status: DC

## 2020-10-27 NOTE — Telephone Encounter (Signed)
Patient states he is not ready to schedule yet call back in a month.

## 2020-10-27 NOTE — Patient Instructions (Signed)
Visit Information  PATIENT GOALS: Goals Addressed              This Visit's Progress     Patient Stated   .  Medication Management (pt-stated)         Patient Goals/Self-Care Activities . Over the next 90 days, patient will:  - focus on medication adherence by collaborating with team on medication access solutions - check blood glucose twice daily, document, and provide at future appointments          Patient verbalizes understanding of instructions provided today and agrees to view in [place].  Plan: Telephone follow up appointment with care management team member scheduled for:  ~8 weeks  [name], PharmD, [place], [place] Clinical Pharmacist [employer] at [employer] [phone]

## 2020-10-27 NOTE — Chronic Care Management (AMB) (Signed)
Chronic Care Management Pharmacy Note  10/27/2020 Name:  VIYAAN CHAMPINE MRN:  [id number] DOB:  1951/02/20  Subjective: Kevin Ryan is an 70 y.o. year old male who is a primary patient of McLean-Scocuzza, Nino Glow, MD.  The CCM team was consulted for assistance with disease management and care coordination needs.    Engaged with patient by telephone for follow up visit in response to provider referral for pharmacy case management and/or care coordination services.   Consent to Services:  The patient was given information about Chronic Care Management services, agreed to services, and gave verbal consent prior to initiation of services.  Please see initial visit note for detailed documentation.   Patient Care Team: McLean-Scocuzza, Nino Glow, MD as PCP - General (Internal Medicine) De Hollingshead, RPH-CPP as Pharmacist (Pharmacist)  Recent office visits: None since our last call  Recent consult visits: 3/23 - [facility] Dr. Radford Pax, plan for facet/SI injections  Hospital visits: None in previous 6 months  Objective:  Lab Results  Component Value Date   CREATININE 0.91 05/14/2020   CREATININE 0.96 01/30/2020   CREATININE 0.88 10/14/2019    Lab Results  Component Value Date   HGBA1C 7.7 (H) 05/14/2020   Last diabetic Eye exam:  Lab Results  Component Value Date/Time   HMDIABEYEEXA No Retinopathy 02/16/2020 12:00 AM    Last diabetic Foot exam: No results found for: HMDIABFOOTEX      Component Value Date/Time   CHOL 93 05/14/2020 1117   TRIG 181.0 (H) 05/14/2020 1117   HDL 29.90 (L) 05/14/2020 1117   CHOLHDL 3 05/14/2020 1117   VLDL 36.2 05/14/2020 1117   LDLCALC 27 05/14/2020 1117   LDLCALC 87 05/16/2018 0800   LDLDIRECT 52.0 01/30/2020 0918    Hepatic Function Latest Ref Rng & Units 05/14/2020 01/30/2020 10/14/2019  Total Protein 6.0 - 8.3 g/dL 7.4 7.5 7.5  Albumin 3.5 - 5.2 g/dL 4.5 4.6 4.3  AST 0 - 37 U/L '17 30 27  ' ALT 0 - 53 U/L 18 30 33  Alk  Phosphatase 39 - 117 U/L 77 84 76  Total Bilirubin 0.2 - 1.2 mg/dL 0.7 0.5 0.5    Lab Results  Component Value Date/Time   TSH 1.64 05/14/2020 11:17 AM   TSH 2.47 06/17/2019 09:30 AM    CBC Latest Ref Rng & Units 05/14/2020 01/30/2020 10/14/2019  WBC 4.0 - 10.5 K/uL 10.1 8.6 8.4  Hemoglobin 13.0 - 17.0 g/dL 15.7 15.9 16.5  Hematocrit 39.0 - 52.0 % 47.2 47.1 48.8  Platelets 150.0 - 400.0 K/uL 146.0(L) 143.0(L) 133.0(L)    Lab Results  Component Value Date/Time   VD25OH 51.10 06/17/2019 09:30 AM    Clinical ASCVD: Yes  The ASCVD Risk score Mikey Bussing DC Jr., et al., 2013) failed to calculate for the following reasons:   The patient has a prior MI or stroke diagnosis     Social History   Tobacco Use  Smoking Status Former Smoker  . Packs/day: 0.50  . Years: 50.00  . Pack years: 25.00  . Types: Cigarettes  Smokeless Tobacco Never Used  Tobacco Comment   1 ppd or a little less (since age 8)   BP Readings from Last 3 Encounters:  07/27/20 110/60  05/14/20 124/68  01/27/20 126/68   Pulse Readings from Last 3 Encounters:  07/27/20 (!) 111  05/14/20 87  01/27/20 90   Wt Readings from Last 3 Encounters:  08/10/20 228 lb (103.4 kg)  07/27/20 228 lb 9.6  oz (103.7 kg)  05/14/20 237 lb 12.8 oz (107.9 kg)    Assessment: Review of patient past medical history, allergies, medications, health status, including review of consultants reports, laboratory and other test data, was performed as part of comprehensive evaluation and provision of chronic care management services.   SDOH:  (Social Determinants of Health) assessments and interventions performed:    CCM Care Plan  Allergies  Allergen Reactions  . Ibuprofen Itching and Rash    Medications Reviewed Today    Reviewed by De Hollingshead, RPH-CPP (Pharmacist) on 10/27/20 at 1509  Med List Status: <None>  Medication Order Taking? Sig Documenting Provider Last Dose Status Informant  acetaminophen (TYLENOL) 500 MG tablet  [id number] Yes Take 1,000 mg by mouth in the morning and at bedtime. [provider] Taking Active   aspirin 81 MG tablet [id number] Yes Take 81 mg by mouth daily. [provider] Taking Active Self  cholecalciferol (VITAMIN D) 25 MCG (1000 UNIT) tablet [id number] Yes Take 1,000 Units by mouth daily.  [provider] Taking Active   clopidogrel (PLAVIX) 75 MG tablet [id number] Yes Take 75 mg by mouth daily. [provider] Taking Active Self  empagliflozin (JARDIANCE) 25 MG TABS tablet [id number] Yes Take 1 tablet (25 mg total) by mouth daily before breakfast. McLean-Scocuzza, Nino Glow, MD Taking Active   ezetimibe (ZETIA) 10 MG tablet [id number] Yes TAKE ONE TABLET EVERY DAY McLean-Scocuzza, Nino Glow, MD Taking Active   icosapent Ethyl (VASCEPA) 1 g capsule [id number] Yes Take 2 capsules (2 g total) by mouth 2 (two) times daily. McLean-Scocuzza, Nino Glow, MD Taking Active   lisinopril (ZESTRIL) 2.5 MG tablet [id number] Yes TAKE 1 TABLET BY MOUTH DAILY McLean-Scocuzza, Nino Glow, MD Taking Active   MAGNESIUM PO [id number] Yes Take by mouth daily. [provider] Taking Active   meclizine (ANTIVERT) 25 MG tablet [id number]  Take 25 mg by mouth 3 (three) times daily as needed for dizziness.  Patient not taking: Reported on 09/13/2020   [provider]  Active   meloxicam (MOBIC) 7.5 MG tablet [id number] Yes Take 1 tablet (7.5 mg total) by mouth 2 (two) times daily as needed for pain. McLean-Scocuzza, Nino Glow, MD Taking Active   metFORMIN (GLUCOPHAGE XR) 500 MG 24 hr tablet [id number] Yes Take 2 tablets (1,000 mg total) by mouth in the morning and at bedtime. McLean-Scocuzza, Nino Glow, MD Taking Active   metoprolol tartrate (LOPRESSOR) 50 MG tablet [id number] Yes Take 1 tablet (50 mg total) by mouth 2 (two) times daily. McLean-Scocuzza, Nino Glow, MD Taking Active   neomycin-polymyxin-hydrocortisone (CORTISPORIN) OTIC solution [id number]  Place 3 drops into the left ear 4  (four) times daily. X up to 1 week prn left ear  Patient not taking: Reported on 09/13/2020   McLean-Scocuzza, Nino Glow, MD  Active   Probiotic Product (PROBIOTIC DAILY PO) [id number] Yes Take by mouth. [provider] Taking Active   rosuvastatin (CRESTOR) 40 MG tablet [id number] Yes Take 1 tablet (40 mg total) by mouth at bedtime. McLean-Scocuzza, Nino Glow, MD Taking Active   Semaglutide [facility]) 7 MG TABS [id number] Yes Take 7 mg by mouth daily. [provider] Taking Active   vitamin B-12 (CYANOCOBALAMIN) 500 MCG tablet [id number] Yes Take 5,000 mcg by mouth daily.  [provider] Taking Active Self          Patient Active Problem List   Diagnosis Date Noted  . History of gastrointestinal stromal tumor (GIST) 07/29/2020  . History of lumbosacral  spine surgery 07/29/2020  . Low testosterone in male 05/14/2020  . Obesity (BMI 30.0-34.9) 02/26/2020  . Hypertension associated with diabetes ([place]) 01/29/2020  . Shoulder pain, right 01/27/2020  . Aortic atherosclerosis ([place]) 01/27/2020  . Emphysema of lung ([place]) 01/27/2020  . Hypogonadism in male 12/11/2019  . OSA on CPAP 09/19/2019  . Vertigo 09/17/2018  . 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 Hyperlipidemia 06/18/2018  . Excessive cerumen in both ear canals 05/16/2018  . 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 Elevated TSH 03/28/2018  . Lung nodule 03/28/2018  . CTS (carpal tunnel syndrome) 03/28/2018  . DM2 (diabetes mellitus, type 2) ([place]) 03/25/2018  . CAD (coronary artery disease) 03/25/2018  . Arthritis 03/25/2018  . Fatty liver 03/25/2018  . Vitamin D deficiency 03/25/2018  . Long term use of drug 01/23/2018  . Cervical spondylosis with myelopathy 11/21/2017  . Spinal stenosis of lumbar region at multiple levels 11/21/2017  . Personal history of tobacco use, presenting hazards to health 07/13/2015  . 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 Influenza-Unspecified 05/01/2019  . Pneumococcal Conjugate-13 05/23/2016  . Pneumococcal Polysaccharide-23 06/17/2019  . Zoster 02/13/2017    Conditions to be addressed/monitored: CAD, HTN, HLD and DMII  Care Plan : Medication Management  Updates made by De Hollingshead, RPH-CPP since 10/27/2020 12:00 AM    Problem: Diabetes, Hyperlipidemia     Long-Range Goal: Disease Progression Prevention   This Visit's Progress: On track  Recent Progress: On track  Priority: High  Note:   Current Barriers:  . Unable to independently afford treatment regimen . Unable to achieve control of diabetes   Pharmacist Clinical Goal(s):  Marland Kitchen Over the next 90 days, patient will verbalize ability to afford treatment regimen. . Over the next 90 days, patient will  achieve adherence to monitoring guidelines and medication adherence to achieve therapeutic efficacy. . Over the next 90 days, patient will maintain control of diabetes and hyperlipidemia as evidenced by labwork through collaboration with PharmD and provider.   Interventions: . 1:1 collaboration with McLean-Scocuzza, Nino Glow, MD regarding development and update of comprehensive plan of care as evidenced by provider attestation and co-signature . Inter-disciplinary care team collaboration (see longitudinal plan of care) . Comprehensive medication review performed; medication list updated in electronic medical record  Diabetes: . Uncontrolled; current treatment: Rybelsus 7 mg daily (max tolerated dose d/t GI upset), metformin XR 1000 mg BID, Jardiance 25 mg daily . Approved for patient assistance for Rybelsus, Jardiance through 06/2021 . Current glucose readings: fastings: 130-140s; 2 hour post prandial: 150-160 . Current meals: breakfast: today had a slice of pizza, yesterday had pancakes; lunch; sandwich,  sometimes eating out; supper: last night spaghetti, sweets afterwards. Ice cream for dessert.  . 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 Continue current regimen at this time. Due for A1c with next PCP visit. Marland Kitchen Extensive dietary education. Reports he has lost a total of ~33 lbs since starting Rybelsus. Discussed minimizing carbohydrate portion sizes. Patient is in agreement with this.  Hypertension: . Controlled per last clinic reading; current treatment: lisinopril 2.5 mg QAM, metoprolol tartrate 50 mg BID . Recommended to continue current regimen. Will discuss home BP monitoring moving forward   Hyperlipidemia, ASCVD Secondary Prevention: . Controlled; current treatment: ezetimibe 10 mg daily, rosuvastatin 20 mg daily- was increased to  40 mg, but does not appear this dose has been filled, Vascepa 2 g BID; follows with Dr. Saralyn Pilar o [employer] funding for Vascepa, rosuvastatin, and ezetimibe through 12/2020.  Marland Kitchen Antiplatelet therapy: aspirin 81 mg daily w/ clopidogrel 75 mg daily . Resent rosuvastatin 40 mg dose. Continue ezetimibe 10 mg daily, Vascepa 2 g BID.  . Will assist in re-enrollment in [place] funding in May.   Chronic back/hip pain: . Uncontrolled; current regimen: acetaminophen 1000 mg BID, topical bengay cream, meloxicam 7.5 mg BID; met with [facility]. Discussed steroid injections. Patient wanted to contemplate.  . Hip pain - x-ray suggestive for arthritis. . Discussed long term concerns with scheduled NSAIDs, including renal effects. Discussed that acetaminophen dose can be increased to 1000 mg TID. He notes he will focus on minimizing meloxicam use.   Patient Goals/Self-Care Activities . Over the next 90 days, patient will:  - focus on medication adherence by collaborating with team on medication access solutions - check blood glucose twice daily, document, and provide at future appointments  Follow Up  Plan: Telephone follow up appointment with care management team member scheduled for: ~ 8 weeks     Medication Assistance: Rybelsus, Jardiance obtained through [place] medication assistance program.  Enrollment ends 07/30/21  Patient's preferred pharmacy is:  [place], [place] [facility] [address] [place] [address] Phone: [phone] Fax: [phone]   Follow Up:  Patient agrees to Care Plan and Follow-up.  Plan: Telephone follow up appointment with care management team member scheduled for:  ~8 weeks  [name], PharmD, [place], [place] Clinical Pharmacist [employer] [facility] [employer] [phone]

## 2020-11-03 ENCOUNTER — Telehealth: Payer: Self-pay | Admitting: Internal Medicine

## 2020-11-03 NOTE — Telephone Encounter (Signed)
Called and informed the Patient that his Rybelsus has been delivered to the office. Patient verbalized understanding. States he will come to pick this up.   Placed upfront labeled with Patient's name.

## 2020-11-10 ENCOUNTER — Ambulatory Visit: Payer: Medicare Other | Admitting: Internal Medicine

## 2020-11-11 ENCOUNTER — Other Ambulatory Visit (INDEPENDENT_AMBULATORY_CARE_PROVIDER_SITE_OTHER): Payer: Medicare Other

## 2020-11-11 ENCOUNTER — Other Ambulatory Visit: Payer: Medicare Other

## 2020-11-11 ENCOUNTER — Other Ambulatory Visit: Payer: Self-pay

## 2020-11-11 DIAGNOSIS — Z125 Encounter for screening for malignant neoplasm of prostate: Secondary | ICD-10-CM | POA: Diagnosis not present

## 2020-11-11 DIAGNOSIS — M25551 Pain in right hip: Secondary | ICD-10-CM | POA: Diagnosis not present

## 2020-11-11 DIAGNOSIS — I2510 Atherosclerotic heart disease of native coronary artery without angina pectoris: Secondary | ICD-10-CM

## 2020-11-11 DIAGNOSIS — E1165 Type 2 diabetes mellitus with hyperglycemia: Secondary | ICD-10-CM | POA: Diagnosis not present

## 2020-11-11 DIAGNOSIS — Z1389 Encounter for screening for other disorder: Secondary | ICD-10-CM

## 2020-11-11 LAB — CBC WITH DIFFERENTIAL/PLATELET
Basophils Absolute: 0.1 10*3/uL (ref 0.0–0.1)
Basophils Relative: 0.9 % (ref 0.0–3.0)
Eosinophils Absolute: 0.5 10*3/uL (ref 0.0–0.7)
Eosinophils Relative: 4.6 % (ref 0.0–5.0)
HCT: 45.7 % (ref 39.0–52.0)
Hemoglobin: 15.3 g/dL (ref 13.0–17.0)
Lymphocytes Relative: 24.8 % (ref 12.0–46.0)
Lymphs Abs: 2.7 10*3/uL (ref 0.7–4.0)
MCHC: 33.5 g/dL (ref 30.0–36.0)
MCV: 95.3 fl (ref 78.0–100.0)
Monocytes Absolute: 0.6 10*3/uL (ref 0.1–1.0)
Monocytes Relative: 5.7 % (ref 3.0–12.0)
Neutro Abs: 6.9 10*3/uL (ref 1.4–7.7)
Neutrophils Relative %: 64 % (ref 43.0–77.0)
Platelets: 176 10*3/uL (ref 150.0–400.0)
RBC: 4.8 Mil/uL (ref 4.22–5.81)
RDW: 16.2 % — ABNORMAL HIGH (ref 11.5–15.5)
WBC: 10.8 10*3/uL — ABNORMAL HIGH (ref 4.0–10.5)

## 2020-11-11 LAB — LDL CHOLESTEROL, DIRECT: Direct LDL: 50 mg/dL

## 2020-11-11 LAB — COMPREHENSIVE METABOLIC PANEL
ALT: 14 U/L (ref 0–53)
AST: 16 U/L (ref 0–37)
Albumin: 4.6 g/dL (ref 3.5–5.2)
Alkaline Phosphatase: 75 U/L (ref 39–117)
BUN: 30 mg/dL — ABNORMAL HIGH (ref 6–23)
CO2: 27 mEq/L (ref 19–32)
Calcium: 10.1 mg/dL (ref 8.4–10.5)
Chloride: 99 mEq/L (ref 96–112)
Creatinine, Ser: 1.05 mg/dL (ref 0.40–1.50)
GFR: 72.04 mL/min (ref >60.00)
Glucose, Bld: 145 mg/dL — ABNORMAL HIGH (ref 70–99)
Potassium: 5.1 mEq/L (ref 3.5–5.1)
Sodium: 137 mEq/L (ref 135–145)
Total Bilirubin: 0.6 mg/dL (ref 0.2–1.2)
Total Protein: 8 g/dL (ref 6.0–8.3)

## 2020-11-11 LAB — HEMOGLOBIN A1C: Hgb A1c MFr Bld: 7.2 % — ABNORMAL HIGH (ref 4.6–6.5)

## 2020-11-11 LAB — PSA, MEDICARE: PSA: 0.41 ng/ml (ref 0.10–4.00)

## 2020-11-11 LAB — LIPID PANEL
Cholesterol: 106 mg/dL (ref 0–200)
HDL: 33.1 mg/dL — ABNORMAL LOW (ref >39.00)
NonHDL: 72.58
Total CHOL/HDL Ratio: 3
Triglycerides: 294 mg/dL — ABNORMAL HIGH (ref 0.0–149.0)
VLDL: 58.8 mg/dL — ABNORMAL HIGH (ref 0.0–40.0)

## 2020-11-12 LAB — URINALYSIS, ROUTINE W REFLEX MICROSCOPIC
Bilirubin Urine: NEGATIVE
Hgb urine dipstick: NEGATIVE
Ketones, ur: NEGATIVE
Leukocytes,Ua: NEGATIVE
Nitrite: NEGATIVE
Protein, ur: NEGATIVE
Specific Gravity, Urine: 1.039 — ABNORMAL HIGH (ref 1.001–1.03)
pH: <=5 (ref 5.0–8.0)

## 2020-11-15 ENCOUNTER — Telehealth: Payer: Self-pay

## 2020-11-16 NOTE — Telephone Encounter (Signed)
See result notes for 11/11/2020

## 2020-11-26 ENCOUNTER — Ambulatory Visit: Payer: Medicare Other | Admitting: Internal Medicine

## 2020-12-07 ENCOUNTER — Ambulatory Visit (INDEPENDENT_AMBULATORY_CARE_PROVIDER_SITE_OTHER): Payer: Medicare Other | Admitting: Internal Medicine

## 2020-12-07 ENCOUNTER — Encounter: Payer: Self-pay | Admitting: Internal Medicine

## 2020-12-07 ENCOUNTER — Other Ambulatory Visit: Payer: Self-pay

## 2020-12-07 VITALS — BP 124/68 | HR 85 | Temp 98.9°F | Ht 72.1 in | Wt 233.0 lb

## 2020-12-07 DIAGNOSIS — Z8509 Personal history of malignant neoplasm of other digestive organs: Secondary | ICD-10-CM

## 2020-12-07 DIAGNOSIS — N3000 Acute cystitis without hematuria: Secondary | ICD-10-CM | POA: Diagnosis not present

## 2020-12-07 DIAGNOSIS — E119 Type 2 diabetes mellitus without complications: Secondary | ICD-10-CM

## 2020-12-07 DIAGNOSIS — Z1211 Encounter for screening for malignant neoplasm of colon: Secondary | ICD-10-CM

## 2020-12-07 DIAGNOSIS — E785 Hyperlipidemia, unspecified: Secondary | ICD-10-CM

## 2020-12-07 DIAGNOSIS — M67911 Unspecified disorder of synovium and tendon, right shoulder: Secondary | ICD-10-CM | POA: Diagnosis not present

## 2020-12-07 DIAGNOSIS — D72829 Elevated white blood cell count, unspecified: Secondary | ICD-10-CM

## 2020-12-07 DIAGNOSIS — Z720 Tobacco use: Secondary | ICD-10-CM

## 2020-12-07 DIAGNOSIS — E1159 Type 2 diabetes mellitus with other circulatory complications: Secondary | ICD-10-CM

## 2020-12-07 DIAGNOSIS — J449 Chronic obstructive pulmonary disease, unspecified: Secondary | ICD-10-CM

## 2020-12-07 DIAGNOSIS — I10 Essential (primary) hypertension: Secondary | ICD-10-CM

## 2020-12-07 DIAGNOSIS — I152 Hypertension secondary to endocrine disorders: Secondary | ICD-10-CM

## 2020-12-07 DIAGNOSIS — T148XXA Other injury of unspecified body region, initial encounter: Secondary | ICD-10-CM

## 2020-12-07 DIAGNOSIS — E782 Mixed hyperlipidemia: Secondary | ICD-10-CM

## 2020-12-07 DIAGNOSIS — D369 Benign neoplasm, unspecified site: Secondary | ICD-10-CM

## 2020-12-07 DIAGNOSIS — M67912 Unspecified disorder of synovium and tendon, left shoulder: Secondary | ICD-10-CM

## 2020-12-07 DIAGNOSIS — E1165 Type 2 diabetes mellitus with hyperglycemia: Secondary | ICD-10-CM

## 2020-12-07 MED ORDER — MUPIROCIN 2 % EX OINT: 1.0000 "application " | TOPICAL_OINTMENT | Freq: Two times a day (BID) | CUTANEOUS | 0 refills | Status: DC

## 2020-12-07 MED ORDER — TRAMADOL HCL 50 MG PO TABS: 50.0000 mg | ORAL_TABLET | Freq: Three times a day (TID) | ORAL | 2 refills | Status: DC | PRN

## 2020-12-07 NOTE — Progress Notes (Signed)
Chief Complaint  Patient presents with  . Follow-up   F/u  1. DM 2 A1C improved can not tolerate higher than rybelsus 7 mg qd due to diarrhea, on metformin xr 1000 mg bid, jardiance 25 mg qd  BP controlled on zetia 10 mg qd, vascepa 2 g bid, lis 2.5 mg qd, lopressor 50 mg bid, crestor 40 mg qd  2. Chronic shoulder pain and back pain with right rotator cuff >left needs surgery declines steroid shots with ortho and did not do PT was taking mobic but will stop due to long term side effects risks and taking 4 tylenol.  Will sign pain contract for tramadol today 3. H/o COPD noted CT scan some day smoker  4. Right foot with b/l braces had since 2019 rubbed skin breakdown right foot consider podiatry getting new braces [employer] and apply pressure dressing  Abrasions to b/l forearms   Review of Systems  Constitutional: Positive for weight loss.  HENT: Negative for hearing loss.   Eyes: Negative for blurred vision.  Respiratory: Negative for shortness of breath.   Gastrointestinal: Negative for abdominal pain.  Musculoskeletal: Positive for back pain and joint pain. Negative for falls.  Skin: Negative for rash.       Wounds to b/l arms   Neurological: Negative for headaches.  Psychiatric/Behavioral: Negative for depression.   Past Medical History:  Diagnosis Date  . Abnormal CT of the abdomen   . Anemia    decreased platelets in last blood work  . 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 CAD (coronary artery disease)    s/p MI  . CAD (coronary artery disease)    s/p stent x 1 2004   . Cancer (HCC)    spindle cell stomach   . 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 Gastric mass 04/12/2015   cancer; resected  . History of kidney stones   . Hypercholesterolemia   . Hypertension   . Myocardial infarction ([place]) 2004  . Personal history of tobacco use, presenting hazards to health 07/13/2015  . Pneumonia   . Sleep apnea    CPAP  . 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 Vertigo    none in approx 1 yr  . Wears dentures    partial upper  . Wears hearing aid in both ears    Past Surgical History:  Procedure Laterality Date  . BACK SURGERY     01/29/18 [facility] lower  back   . CARDIAC CATHETERIZATION  2004   1 stent  . CARPAL TUNNEL RELEASE Bilateral   . CATARACT EXTRACTION W/PHACO Left 06/04/2019   Procedure: CATARACT EXTRACTION PHACO AND INTRAOCULAR LENS PLACEMENT ([facility]) LEFT DIABETIC 01:08.3  18.6%  12.76;  Surgeon: Leandrew Koyanagi, MD;  Location: [place];  Service: Ophthalmology;  Laterality: Left;  diabetic - oral meds sleep apnea  . CATARACT EXTRACTION W/PHACO Right 07/02/2019   Procedure: CATARACT EXTRACTION PHACO AND INTRAOCULAR LENS PLACEMENT ([place]) Right DIABETIC;  Surgeon: Leandrew Koyanagi, MD;  Location: [place];  Service: Ophthalmology;  Laterality: Right;  diabetic-oral med sleep apnea (CPAP)  . COLONOSCOPY WITH PROPOFOL N/A 03/26/2015   Procedure: COLONOSCOPY WITH PROPOFOL;  Surgeon: Josefine Class, MD;  Location: [facility] ENDOSCOPY;  Service: Endoscopy;  Laterality: N/A;  . KNEE SURGERY     TKR  . KNEE SURGERY     b/l partial knee Dr. Leanna Sato   . PARTIAL GASTRECTOMY     2016  [facility]   Family History  Problem Relation Age of Onset  . Diabetes Mellitus II Maternal Grandfather   . Heart disease Maternal Grandfather   . CAD Paternal Grandfather   . Cancer Paternal Grandfather        ? type  . Arthritis Mother   . Depression Mother   . Hearing loss Mother   . Diabetes Father   . Cancer Brother        throat smoker   . Hearing loss Maternal Grandmother   . Arthritis Maternal Grandmother    Social History   Socioeconomic History  . Marital status: Married    Spouse name: Not on file  . Number of children: Not on file  . Years of education: Not on file  . Highest education level: Not on file  Occupational History  . Not on file  Tobacco Use  . Smoking status: Current Some Day Smoker     Packs/day: 0.50    Years: 50.00    Pack years: 25.00    Types: Cigarettes  . Smokeless tobacco: Never Used  . Tobacco comment: 1 ppd or a little less (since age 30)  Vaping Use  . Vaping Use: Former  Substance and Sexual Activity  . Alcohol use: No    Comment: may have drink 3-4x/yr  . Drug use: No  . Sexual activity: Yes    Birth control/protection: None  Other Topics Concern  . 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 Difficulty of Paying Living Expenses: Somewhat hard  Food Insecurity: No Food Insecurity  . Worried About Charity fundraiser in the Last Year: Never true  . Ran Out of Food in the Last Year: Never true  Transportation Needs: No Transportation Needs  . Lack of Transportation (Medical): No  . Lack of Transportation (Non-Medical): No  Physical Activity: Not on file  Stress: No Stress Concern Present  . Feeling of Stress : Not at all  Social Connections: Unknown  . Frequency of Communication with Friends and Family: Not on file  . Frequency of Social Gatherings with Friends and Family: Not on file  . Attends Religious Services: Not on file  . Active Member of Clubs or Organizations: Not on file  . Attends Archivist Meetings: Not on file  . Marital Status: Married  Human resources officer Violence: Not At Risk  . Fear of Current or Ex-Partner: No  . Emotionally Abused: No  . Physically Abused: No  . Sexually Abused: No   Current Meds  Medication Sig  . acetaminophen (TYLENOL) 500 MG tablet Take 1,000 mg by mouth in the morning and at bedtime.  Marland Kitchen aspirin 81 MG tablet Take 81 mg by mouth daily.  . cholecalciferol (VITAMIN D) 25 MCG (1000 UNIT) tablet Take 1,000 Units by mouth  daily.   . clopidogrel (PLAVIX) 75 MG tablet Take 75 mg by mouth daily.  Marland Kitchen ezetimibe (ZETIA) 10 MG tablet TAKE ONE TABLET EVERY DAY  . lisinopril (ZESTRIL) 2.5 MG tablet TAKE 1 TABLET BY MOUTH DAILY  . MAGNESIUM PO Take by mouth  daily.  . mupirocin ointment (BACTROBAN) 2 % Apply 1 application topically 2 (two) times daily.  . Probiotic Product (PROBIOTIC DAILY PO) Take by mouth.  . Semaglutide (RYBELSUS) 7 MG TABS Take 7 mg by mouth daily.  . traMADol (ULTRAM) 50 MG tablet Take 1 tablet (50 mg total) by mouth every 8 (eight) hours as needed for up to 5 days.  . vitamin B-12 (CYANOCOBALAMIN) 500 MCG tablet Take 5,000 mcg by mouth daily.   . [DISCONTINUED] empagliflozin (JARDIANCE) 25 MG TABS tablet Take 1 tablet (25 mg total) by mouth daily before breakfast.  . [DISCONTINUED] icosapent Ethyl (VASCEPA) 1 g capsule Take 2 capsules (2 g total) by mouth 2 (two) times daily.  . [DISCONTINUED] metFORMIN (GLUCOPHAGE XR) 500 MG 24 hr tablet Take 2 tablets (1,000 mg total) by mouth in the morning and at bedtime.  . [DISCONTINUED] metoprolol tartrate (LOPRESSOR) 50 MG tablet Take 1 tablet (50 mg total) by mouth 2 (two) times daily.  . [DISCONTINUED] rosuvastatin (CRESTOR) 40 MG tablet Take 1 tablet (40 mg total) by mouth at bedtime.   Allergies  Allergen Reactions  . Ibuprofen Itching and Rash   Recent Results (from the past 2160 hour(s))  PSA, Medicare ( [place] [facility] only)     Status: None   Collection Time: 11/11/20  9:44 AM  Result Value Ref Range   PSA 0.41 0.10 - 4.00 ng/ml    Comment: Test performed using Access Hybritech PSA Assay, a parmagnetic partical, chemiluminecent immunoassay.  Urinalysis, Routine w reflex microscopic     Status: Abnormal   Collection Time: 11/11/20  9:44 AM  Result Value Ref Range   Color, Urine YELLOW YELLOW   APPearance CLEAR CLEAR   Specific Gravity, Urine 1.039 (H) 1.001 - 1.03   pH < OR = 5.0 5.0 - 8.0   Glucose, UA 3+ (A) NEGATIVE   Bilirubin Urine  NEGATIVE NEGATIVE   Ketones, ur NEGATIVE NEGATIVE   Hgb urine dipstick NEGATIVE NEGATIVE   Protein, ur NEGATIVE NEGATIVE   Nitrite NEGATIVE NEGATIVE   Leukocytes,Ua NEGATIVE NEGATIVE  Hemoglobin A1c     Status: Abnormal   Collection Time: 11/11/20  9:44 AM  Result Value Ref Range   Hgb A1c MFr Bld 7.2 (H) 4.6 - 6.5 %    Comment: Glycemic Control Guidelines for People with Diabetes:Non Diabetic:  <6%Goal of Therapy: <7%Additional Action Suggested:  >8%   CBC w/Diff     Status: Abnormal   Collection Time: 11/11/20  9:44 AM  Result Value Ref Range   WBC 10.8 (H) 4.0 - 10.5 K/uL   RBC 4.80 4.22 - 5.81 Mil/uL   Hemoglobin 15.3 13.0 - 17.0 g/dL   HCT 45.7 39.0 - 52.0 %   MCV 95.3 78.0 - 100.0 fl   MCHC 33.5 30.0 - 36.0 g/dL   RDW 16.2 (H) 11.5 - 15.5 %   Platelets 176.0 150.0 - 400.0 K/uL   Neutrophils Relative % 64.0 43.0 - 77.0 %   Lymphocytes Relative 24.8 12.0 - 46.0 %   Monocytes Relative 5.7 3.0 - 12.0 %   Eosinophils Relative 4.6 0.0 - 5.0 %   Basophils Relative 0.9 0.0 - 3.0 %   Neutro Abs 6.9 1.4 - 7.7 K/uL   Lymphs Abs 2.7 0.7 - 4.0 K/uL   Monocytes Absolute 0.6 0.1 - 1.0 K/uL   Eosinophils Absolute 0.5 0.0 - 0.7 K/uL   Basophils Absolute 0.1 0.0 - 0.1 K/uL  Lipid panel     Status: Abnormal  Collection Time: 11/11/20  9:44 AM  Result Value Ref Range   Cholesterol 106 0 - 200 mg/dL    Comment: ATP III Classification       Desirable:  < 200 mg/dL               Borderline High:  200 - 239 mg/dL          High:  > = 240 mg/dL   Triglycerides 294.0 (H) 0.0 - 149.0 mg/dL    Comment: Normal:  <150 mg/dLBorderline High:  150 - 199 mg/dL   HDL 33.10 (L) >39.00 mg/dL   VLDL 58.8 (H) 0.0 - 40.0 mg/dL   Total CHOL/HDL Ratio 3     Comment:                Men          Women1/2 Average Risk     3.4          3.3Average Risk          5.0          4.42X Average Risk          9.6          7.13X Average Risk          15.0          11.0                       NonHDL 72.58     Comment: NOTE:   Non-HDL goal should be 30 mg/dL higher than patient's LDL goal (i.e. LDL goal of < 70 mg/dL, would have non-HDL goal of < 100 mg/dL)  Comprehensive metabolic panel     Status: Abnormal   Collection Time: 11/11/20  9:44 AM  Result Value Ref Range   Sodium 137 135 - 145 mEq/L   Potassium 5.1 3.5 - 5.1 mEq/L   Chloride 99 96 - 112 mEq/L   CO2 27 19 - 32 mEq/L   Glucose, Bld 145 (H) 70 - 99 mg/dL   BUN 30 (H) 6 - 23 mg/dL   Creatinine, Ser 1.05 0.40 - 1.50 mg/dL   Total Bilirubin 0.6 0.2 - 1.2 mg/dL   Alkaline Phosphatase 75 39 - 117 U/L   AST 16 0 - 37 U/L   ALT 14 0 - 53 U/L   Total Protein 8.0 6.0 - 8.3 g/dL   Albumin 4.6 3.5 - 5.2 g/dL   GFR 72.04 >60.00 mL/min    Comment: Calculated using the CKD-EPI Creatinine Equation (2021)   Calcium 10.1 8.4 - 10.5 mg/dL  LDL cholesterol, direct     Status: None   Collection Time: 11/11/20  9:44 AM  Result Value Ref Range   Direct LDL 50.0 mg/dL    Comment: Optimal:  <100 mg/dLNear or Above Optimal:  100-129 mg/dLBorderline High:  130-159 mg/dLHigh:  160-189 mg/dLVery High:  >190 mg/dL   Objective  Body mass index is 31.51 kg/m. Wt Readings from Last 3 Encounters:  12/07/20 233 lb (105.7 kg)  08/10/20 228 lb (103.4 kg)  07/27/20 228 lb 9.6 oz (103.7 kg)   Temp Readings from Last 3 Encounters:  12/07/20 98.9 F (37.2 C) (Oral)  07/27/20 98.9 F (37.2 C)  05/14/20 98.3 F (36.8 C) (Oral)   BP Readings from Last 3 Encounters:  12/07/20 124/68  07/27/20 110/60  05/14/20 124/68   Pulse Readings from Last 3 Encounters:  12/07/20 85  07/27/20 (!) 111  05/14/20  87    Physical Exam Vitals and nursing note reviewed.  Constitutional:      Appearance: Normal appearance. He is well-developed and well-groomed. He is obese.  HENT:     Head: Normocephalic and atraumatic.  Eyes:     Conjunctiva/sclera: Conjunctivae normal.     Pupils: Pupils are equal, round, and reactive to light.  Cardiovascular:     Rate and Rhythm: Normal rate  and regular rhythm.     Heart sounds: Normal heart sounds. No murmur heard.   Pulmonary:     Effort: Pulmonary effort is normal.     Breath sounds: Normal breath sounds.  Skin:    General: Skin is warm and dry.  Neurological:     General: No focal deficit present.     Mental Status: He is alert and oriented to person, place, and time. Mental status is at baseline.     Gait: Gait normal.     Comments: Braces b/l lower legs  Psychiatric:        Attention and Perception: Attention and perception normal.        Mood and Affect: Mood and affect normal.        Speech: Speech normal.        Behavior: Behavior normal. Behavior is cooperative.        Thought Content: Thought content normal.        Cognition and Memory: Cognition and memory normal.        Judgment: Judgment normal.     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 Radford Pax for brace orders   Tubular adenoma h/o stromal GIST tumor - Plan: Ambulatory referral to [facility]  Tobacco abuse rec smoking cessation Chronic obstructive pulmonary disease, unspecified COPD type ([place]) rec cessation Declines CT chest as of 12/07/20   Mixed hyperlipidemia - Plan: icosapent Ethyl (VASCEPA) 2 g bid, crestor 40 and zetia 10   HM Flu shot and covid declines 2021  Tdap and shingles check  [facility] -Tdap no recently recordper pt had in 2016 or 2017in EDno record  prevnar utd  pna 23 utd Consider shingrix  rec hepA/hep Bpt declinesh/o fatty liver MMR immune  H/o colon polyps 03/26/15 polys x 3-4 tubular and hyperplastic repeat in 3-5 years [facility] Due 03/25/20 [facility] -referred [facility] as of 12/07/20   PSA normal0.41 11/11/20 B12 01/23/18 474   rec healthy diet and exercise   H/o lung nodules consider ct chest in future  -smoker age 69 to present max 1.5 ppd now <1 ppd no FH lung cancer rec cessation -CT chest 10/24/19 IMPRESSION: 1. Lung-RADS 2, benign appearance or behavior. Continue annual screening with low-dose chest CT without contrast in 12 months. 2. Hepatic steatosis. 3. Emphysema (ICD10-J43.9) and Aortic Atherosclerosis (ICD10-170.0) REC SMOKING CESSATION CT  chest 10/24/19-declines as of 12/07/20   Consider dermatology in the futurebut currently no need as of 05/14/20, 12/07/20  H/o riedel [facility] appt 6/2021H/O SPINDLE CELL CA/GIST TUMOR STOMACHf/u 06/2020 and if normal then yearly12/8/21 Impression:  No evidence of recurrent or metastatic disease within the abdomen or  Pelvis.   Provider: Dr. [name]-Internal Medicine

## 2020-12-07 NOTE — Patient Instructions (Addendum)
Let me know when ready for referral to GI [facility] clinic  You are due  Consider podiatry and use pressure dressing to prevent brace from rubbing use bactroban  Dial or dove antibacterial body wash  Consider braces replacements with [employer]/Dr. Radford Pax    High Cholesterol  High cholesterol is a condition in which the blood has high levels of a white, waxy substance similar to fat (cholesterol). The liver makes all the cholesterol that the body needs. The human body needs small amounts of cholesterol to help build cells. A person gets extra or excess cholesterol from the food that he or she eats. The blood carries cholesterol from the liver to the rest of the body. If you have high cholesterol, deposits (plaques) may build up on the walls of your arteries. Arteries are the blood vessels that carry blood away from your heart. These plaques make the arteries narrow and stiff. Cholesterol plaques increase your risk for heart attack and stroke. Work with your health care provider to keep your cholesterol levels in a healthy range. What increases the risk? The following factors may make you more likely to develop this condition:  Eating foods that are high in animal fat (saturated fat) or cholesterol.  Being overweight.  Not getting enough exercise.  A family history of high cholesterol (familial hypercholesterolemia).  Use of tobacco products.  Having diabetes. What are the signs or symptoms? There are no symptoms of this condition. How is this diagnosed? This condition may be diagnosed based on the results of a blood test.  If you are older than 70 years of age, your health care provider may check your cholesterol levels every 4-6 years.  You may be checked more often if you have high cholesterol or other risk factors for heart disease. The blood test for cholesterol measures:  "Bad" cholesterol, or LDL cholesterol. This is the main type of cholesterol that causes heart disease. The  desired level is less than 100 mg/dL.  "Good" cholesterol, or HDL cholesterol. HDL helps protect against heart disease by cleaning the arteries and carrying the LDL to the liver for processing. The desired level for HDL is 60 mg/dL or higher.  Triglycerides. These are fats that your body can store or burn for energy. The desired level is less than 150 mg/dL.  Total cholesterol. This measures the total amount of cholesterol in your blood and includes LDL, HDL, and triglycerides. The desired level is less than 200 mg/dL. How is this treated? This condition may be treated with:  Diet changes. You may be asked to eat foods that have more fiber and less saturated fats or added sugar.  Lifestyle changes. These may include regular exercise, maintaining a healthy weight, and quitting use of tobacco products.  Medicines. These are given when diet and lifestyle changes have not worked. You may be prescribed a statin medicine to help lower your cholesterol levels. Follow these instructions at home: Eating and drinking  Eat a healthy, balanced diet. This diet includes: ? Daily servings of a variety of fresh, frozen, or canned fruits and vegetables. ? Daily servings of whole grain foods that are rich in fiber. ? Foods that are low in saturated fats and trans fats. These include poultry and fish without skin, lean cuts of meat, and low-fat dairy products. ? A variety of fish, especially oily fish that contain omega-3 fatty acids. Aim to eat fish at least 2 times a week.  Avoid foods and drinks that have added sugar.  Use  healthy cooking methods, such as roasting, grilling, broiling, baking, poaching, steaming, and stir-frying. Do not fry your food except for stir-frying.   Lifestyle  Get regular exercise. Aim to exercise for a total of 150 minutes a week. Increase your activity level by doing activities such as gardening, walking, and taking the stairs.  Do not use any products that contain nicotine  or tobacco, such as cigarettes, e-cigarettes, and chewing tobacco. If you need help quitting, ask your health care provider.   General instructions  Take over-the-counter and prescription medicines only as told by your health care provider.  Keep all follow-up visits as told by your health care provider. This is important. Where to find more information  American Heart Association: www.heart.org  National Heart, Lung, and Blood Institute: https://wilson-eaton.com/ Contact a health care provider if:  You have trouble achieving or maintaining a healthy diet or weight.  You are starting an exercise program.  You are unable to stop smoking. Get help right away if:  You have chest pain.  You have trouble breathing.  You have any symptoms of a stroke. "BE FAST" is an easy way to remember the main warning signs of a stroke: ? B - Balance. Signs are dizziness, sudden trouble walking, or loss of balance. ? E - Eyes. Signs are trouble seeing or a sudden change in vision. ? F - Face. Signs are sudden weakness or numbness of the face, or the face or eyelid drooping on one side. ? A - Arms. Signs are weakness or numbness in an arm. This happens suddenly and usually on one side of the body. ? S - Speech. Signs are sudden trouble speaking, slurred speech, or trouble understanding what people say. ? T - Time. Time to call emergency services. Write down what time symptoms started.  You have other signs of a stroke, such as: ? A sudden, severe headache with no known cause. ? Nausea or vomiting. ? Seizure. These symptoms may represent a serious problem that is an emergency. Do not wait to see if the symptoms will go away. Get medical help right away. Call your local emergency services (911 in the [place] Do not drive yourself to the hospital. Summary  Cholesterol plaques increase your risk for heart attack and stroke. Work with your health care provider to keep your cholesterol levels in a healthy  range.  Eat a healthy, balanced diet, get regular exercise, and maintain a healthy weight.  Do not use any products that contain nicotine or tobacco, such as cigarettes, e-cigarettes, and chewing tobacco.  Get help right away if you have any symptoms of a stroke. This information is not intended to replace advice given to you by your health care provider. Make sure you discuss any questions you have with your health care provider. Document Revised: 06/16/2019 Document Reviewed: 06/16/2019 Elsevier Patient Education  2021 [place].  Cholesterol Content in Foods Cholesterol is a waxy, fat-like substance that helps to carry fat in the blood. The body needs cholesterol in small amounts, but too much cholesterol can cause damage to the arteries and heart. Most people should eat less than 200 milligrams (mg) of cholesterol a day. Foods with cholesterol Cholesterol is found in animal-based foods, such as meat, seafood, and dairy. Generally, low-fat dairy and lean meats have less cholesterol than full-fat dairy and fatty meats. The milligrams of cholesterol per serving (mg per serving) of common cholesterol-containing foods are listed below. Meat and other proteins  Egg -- one large whole egg  has 186 mg.  Veal shank -- 4 oz has 141 mg.  Lean ground [place] (93% lean) -- 4 oz has 118 mg.  Fat-trimmed lamb loin -- 4 oz has 106 mg.  Lean ground beef (90% lean) -- 4 oz has 100 mg.  Lobster -- 3.5 oz has 90 mg.  Pork loin chops -- 4 oz has 86 mg.  Canned salmon -- 3.5 oz has 83 mg.  Fat-trimmed beef top loin -- 4 oz has 78 mg.  Frankfurter -- 1 frank (3.5 oz) has 77 mg.  Crab -- 3.5 oz has 71 mg.  Roasted chicken without skin, white meat -- 4 oz has 66 mg.  Light bologna -- 2 oz has 45 mg.  Deli-cut [place] -- 2 oz has 31 mg.  Canned tuna -- 3.5 oz has 31 mg.  Berniece Salines -- 1 oz has 29 mg.  Oysters and mussels (raw) -- 3.5 oz has 25 mg.  Mackerel -- 1 oz has 22 mg.  Trout -- 1  oz has 20 mg.  Pork sausage -- 1 link (1 oz) has 17 mg.  Salmon -- 1 oz has 16 mg.  Tilapia -- 1 oz has 14 mg. Dairy  Soft-serve ice cream --  cup (4 oz) has 103 mg.  Whole-milk yogurt -- 1 cup (8 oz) has 29 mg.  Cheddar cheese -- 1 oz has 28 mg.  American cheese -- 1 oz has 28 mg.  Whole milk -- 1 cup (8 oz) has 23 mg.  2% milk -- 1 cup (8 oz) has 18 mg.  Cream cheese -- 1 tablespoon (Tbsp) has 15 mg.  Cottage cheese --  cup (4 oz) has 14 mg.  Low-fat (1%) milk -- 1 cup (8 oz) has 10 mg.  Sour cream -- 1 Tbsp has 8.5 mg.  Low-fat yogurt -- 1 cup (8 oz) has 8 mg.  Nonfat Greek yogurt -- 1 cup (8 oz) has 7 mg.  Half-and-half cream -- 1 Tbsp has 5 mg. Fats and oils  Cod liver oil -- 1 tablespoon (Tbsp) has 82 mg.  Butter -- 1 Tbsp has 15 mg.  Lard -- 1 Tbsp has 14 mg.  Bacon grease -- 1 Tbsp has 14 mg.  Mayonnaise -- 1 Tbsp has 5-10 mg.  Margarine -- 1 Tbsp has 3-10 mg. Exact amounts of cholesterol in these foods may vary depending on specific ingredients and brands.   Foods without cholesterol Most plant-based foods do not have cholesterol unless you combine them with a food that has cholesterol. Foods without cholesterol include:  Grains and cereals.  Vegetables.  Fruits.  Vegetable oils, such as olive, canola, and sunflower oil.  Legumes, such as peas, beans, and lentils.  Nuts and seeds.  Egg whites.   Summary  The body needs cholesterol in small amounts, but too much cholesterol can cause damage to the arteries and heart.  Most people should eat less than 200 milligrams (mg) of cholesterol a day. This information is not intended to replace advice given to you by your health care provider. Make sure you discuss any questions you have with your health care provider. Document Revised: 12/08/2019 Document Reviewed: 12/08/2019 Elsevier Patient Education  [place].

## 2020-12-08 DIAGNOSIS — D369 Benign neoplasm, unspecified site: Secondary | ICD-10-CM | POA: Insufficient documentation

## 2020-12-08 DIAGNOSIS — Z720 Tobacco use: Secondary | ICD-10-CM | POA: Insufficient documentation

## 2020-12-08 LAB — MICROALBUMIN / CREATININE URINE RATIO
Creatinine, Urine: 57.8 mg/dL
Microalb/Creat Ratio: <5 mg/g creat (ref 0–29)
Microalbumin, Urine: <3 ug/mL

## 2020-12-08 LAB — CBC WITH DIFFERENTIAL/PLATELET
Basophils Absolute: 0.1 10*3/uL (ref 0.0–0.1)
Basophils Relative: 0.6 % (ref 0.0–3.0)
Eosinophils Absolute: 0.4 10*3/uL (ref 0.0–0.7)
Eosinophils Relative: 3.9 % (ref 0.0–5.0)
HCT: 43.4 % (ref 39.0–52.0)
Hemoglobin: 14.6 g/dL (ref 13.0–17.0)
Lymphocytes Relative: 28.3 % (ref 12.0–46.0)
Lymphs Abs: 3 10*3/uL (ref 0.7–4.0)
MCHC: 33.7 g/dL (ref 30.0–36.0)
MCV: 95.2 fl (ref 78.0–100.0)
Monocytes Absolute: 0.7 10*3/uL (ref 0.1–1.0)
Monocytes Relative: 6.3 % (ref 3.0–12.0)
Neutro Abs: 6.4 10*3/uL (ref 1.4–7.7)
Neutrophils Relative %: 60.9 % (ref 43.0–77.0)
Platelets: 168 10*3/uL (ref 150.0–400.0)
RBC: 4.56 Mil/uL (ref 4.22–5.81)
RDW: 15.3 % (ref 11.5–15.5)
WBC: 10.5 10*3/uL (ref 4.0–10.5)

## 2020-12-08 MED ORDER — ROSUVASTATIN CALCIUM 40 MG PO TABS: 40.0000 mg | ORAL_TABLET | Freq: Every day | ORAL | 3 refills | Status: DC

## 2020-12-08 MED ORDER — EMPAGLIFLOZIN 25 MG PO TABS: 25.0000 mg | ORAL_TABLET | Freq: Every day | ORAL | 3 refills | Status: DC

## 2020-12-08 MED ORDER — METFORMIN HCL ER 500 MG PO TB24: 1000.0000 mg | ORAL_TABLET | Freq: Two times a day (BID) | ORAL | 3 refills | Status: DC

## 2020-12-08 MED ORDER — ICOSAPENT ETHYL 1 G PO CAPS: 2.0000 g | ORAL_CAPSULE | Freq: Two times a day (BID) | ORAL | 3 refills | Status: DC

## 2020-12-08 MED ORDER — TRAMADOL HCL 50 MG PO TABS: 50.0000 mg | ORAL_TABLET | Freq: Two times a day (BID) | ORAL | 0 refills | Status: AC | PRN

## 2020-12-08 MED ORDER — METOPROLOL TARTRATE 50 MG PO TABS
50.0000 mg | ORAL_TABLET | Freq: Two times a day (BID) | ORAL | 3 refills | Status: DC
Start: 2020-12-08 — End: 2021-12-15

## 2020-12-10 LAB — URINE CULTURE

## 2020-12-15 ENCOUNTER — Telehealth: Payer: Self-pay

## 2020-12-15 NOTE — Telephone Encounter (Signed)
Left message for patient to call back and schedule his annual lung cancer screening CT scan at [phone]

## 2020-12-24 ENCOUNTER — Telehealth: Payer: Self-pay

## 2020-12-24 NOTE — Telephone Encounter (Signed)
Faxed patient assistance program refill/reorder request back to [employer] at [phone] on 12/23/20 Rybelsus 7mg 

## 2021-01-06 ENCOUNTER — Ambulatory Visit (INDEPENDENT_AMBULATORY_CARE_PROVIDER_SITE_OTHER): Payer: Medicare Other | Admitting: Pharmacist

## 2021-01-06 DIAGNOSIS — E1159 Type 2 diabetes mellitus with other circulatory complications: Secondary | ICD-10-CM | POA: Diagnosis not present

## 2021-01-06 DIAGNOSIS — E785 Hyperlipidemia, unspecified: Secondary | ICD-10-CM

## 2021-01-06 DIAGNOSIS — I152 Hypertension secondary to endocrine disorders: Secondary | ICD-10-CM

## 2021-01-06 DIAGNOSIS — J449 Chronic obstructive pulmonary disease, unspecified: Secondary | ICD-10-CM

## 2021-01-06 DIAGNOSIS — E1165 Type 2 diabetes mellitus with hyperglycemia: Secondary | ICD-10-CM

## 2021-01-06 MED ORDER — FENOFIBRATE 134 MG PO CAPS: 134.0000 mg | ORAL_CAPSULE | Freq: Every day | ORAL | 1 refills | Status: DC

## 2021-01-06 MED ORDER — OMEGA-3-ACID ETHYL ESTERS 1 G PO CAPS: 2.0000 g | ORAL_CAPSULE | Freq: Two times a day (BID) | ORAL | 3 refills | Status: DC

## 2021-01-06 NOTE — Chronic Care Management (AMB) (Signed)
Chronic Care Management Pharmacy Note  01/06/2021 Name:  Kevin Ryan MRN:  [id number] DOB:  1951-03-13   Subjective: Kevin Ryan is an 70 y.o. year old male who is a primary patient of McLean-Scocuzza, Nino Glow, MD.  The CCM team was consulted for assistance with disease management and care coordination needs.    Engaged with patient by telephone for follow up visit in response to provider referral for pharmacy case management and/or care coordination services.   Consent to Services:  The patient was given information about Chronic Care Management services, agreed to services, and gave verbal consent prior to initiation of services.  Please see initial visit note for detailed documentation.   Patient Care Team: McLean-Scocuzza, Nino Glow, MD as PCP - General (Internal Medicine) De Hollingshead, RPH-CPP as Pharmacist (Pharmacist)  Recent office visits: 4/14 lab - A1c 7.2%, TG 294, LDL 50 5/10 - PCP - continue current regimen    Objective:  Lab Results  Component Value Date   CREATININE 1.05 11/11/2020   CREATININE 0.91 05/14/2020   CREATININE 0.96 01/30/2020    Lab Results  Component Value Date   HGBA1C 7.2 (H) 11/11/2020   Last diabetic Eye exam:  Lab Results  Component Value Date/Time   HMDIABEYEEXA No Retinopathy 02/16/2020 12:00 AM    Last diabetic Foot exam: No results found for: HMDIABFOOTEX      Component Value Date/Time   CHOL 106 11/11/2020 0944   TRIG 294.0 (H) 11/11/2020 0944   HDL 33.10 (L) 11/11/2020 0944   CHOLHDL 3 11/11/2020 0944   VLDL 58.8 (H) 11/11/2020 0944   LDLCALC 27 05/14/2020 1117   LDLCALC 87 05/16/2018 0800   LDLDIRECT 50.0 11/11/2020 0944    Hepatic Function Latest Ref Rng & Units 11/11/2020 05/14/2020 01/30/2020  Total Protein 6.0 - 8.3 g/dL 8.0 7.4 7.5  Albumin 3.5 - 5.2 g/dL 4.6 4.5 4.6  AST 0 - 37 U/L _0 ALT 0 - 53 U/L _1 Alk Phosphatase 39 - 117 U/L 75 77 84  Total Bilirubin 0.2 - 1.2 mg/dL 0.6 0.7 0.5     Lab Results  Component Value Date/Time   TSH 1.64 05/14/2020 11:17 AM   TSH 2.47 06/17/2019 09:30 AM    CBC Latest Ref Rng & Units 12/07/2020 11/11/2020 05/14/2020  WBC 4.0 - 10.5 K/uL 10.5 10.8(H) 10.1  Hemoglobin 13.0 - 17.0 g/dL 14.6 15.3 15.7  Hematocrit 39.0 - 52.0 % 43.4 45.7 47.2  Platelets 150.0 - 400.0 K/uL 168.0 176.0 146.0(L)    Lab Results  Component Value Date/Time   VD25OH 51.10 06/17/2019 09:30 AM    Clinical ASCVD: Yes  The ASCVD Risk score Mikey Bussing DC Jr., et al., 2013) failed to calculate for the following reasons:   The patient has a prior MI or stroke diagnosis     Social History   Tobacco Use  Smoking Status Some Days   Packs/day: 0.50   Years: 50.00   Pack years: 25.00   Types: Cigarettes  Smokeless Tobacco Never  Tobacco Comments   1 ppd or a little less (since age 27)   BP Readings from Last 3 Encounters:  12/07/20 124/68  07/27/20 110/60  05/14/20 124/68   Pulse Readings from Last 3 Encounters:  12/07/20 85  07/27/20 (!) 111  05/14/20 87   Wt Readings from Last 3 Encounters:  12/07/20 233 lb (105.7 kg)  08/10/20 228 lb (103.4 kg)  07/27/20 228 lb 9.6 oz (103.7  kg)    Assessment: Review of patient past medical history, allergies, medications, health status, including review of consultants reports, laboratory and other test data, was performed as part of comprehensive evaluation and provision of chronic care management services.   SDOH:  (Social Determinants of Health) assessments and interventions performed: navigated medication access    CCM Care Plan  Allergies  Allergen Reactions   Ibuprofen Itching and Rash    Medications Reviewed Today     Reviewed by De Hollingshead, RPH-CPP (Pharmacist) on 01/06/21 at 13  Med List Status: <None>   Medication Order Taking? Sig Documenting Provider Last Dose Status Informant  acetaminophen (TYLENOL) 500 MG tablet [id number]  Take 1,000 mg by mouth in the morning and at bedtime.  [provider]  Active   aspirin 81 MG tablet [id number] Yes Take 81 mg by mouth daily. [provider] Taking Active Self  cholecalciferol (VITAMIN D) 25 MCG (1000 UNIT) tablet [id number] Yes Take 1,000 Units by mouth daily.  [provider] Taking Active   clopidogrel (PLAVIX) 75 MG tablet [id number] Yes Take 75 mg by mouth daily. [provider] Taking Active Self  empagliflozin (JARDIANCE) 25 MG TABS tablet [id number] Yes Take 1 tablet (25 mg total) by mouth daily before breakfast. McLean-Scocuzza, Nino Glow, MD Taking Active   ezetimibe (ZETIA) 10 MG tablet [id number] Yes TAKE ONE TABLET EVERY DAY McLean-Scocuzza, Nino Glow, MD Taking Active   lisinopril (ZESTRIL) 2.5 MG tablet [id number] Yes TAKE 1 TABLET BY MOUTH DAILY McLean-Scocuzza, Nino Glow, MD Taking Active   MAGNESIUM PO [id number] Yes Take by mouth daily. [provider] Taking Active   metFORMIN (GLUCOPHAGE XR) 500 MG 24 hr tablet [id number] Yes Take 2 tablets (1,000 mg total) by mouth in the morning and at bedtime. McLean-Scocuzza, Nino Glow, MD Taking Active   metoprolol tartrate (LOPRESSOR) 50 MG tablet [id number] Yes Take 1 tablet (50 mg total) by mouth 2 (two) times daily. McLean-Scocuzza, Nino Glow, MD Taking Active   mupirocin ointment (BACTROBAN) 2 % [id number]  Apply 1 application topically 2 (two) times daily. McLean-Scocuzza, Nino Glow, MD  Active   Probiotic Product (PROBIOTIC DAILY PO) [id number] Yes Take by mouth. [provider] Taking Active   rosuvastatin (CRESTOR) 40 MG tablet [id number] Yes Take 1 tablet (40 mg total) by mouth at bedtime. McLean-Scocuzza, Nino Glow, MD Taking Active   Semaglutide [facility]) 7 MG TABS [id number] Yes Take 7 mg by mouth daily. [provider] Taking Active   traMADol (ULTRAM) 50 MG tablet [id number]  Take 1 tablet (50 mg total) by mouth every 12 (twelve) hours as needed. McLean-Scocuzza, Nino Glow, MD  Active   vitamin B-12 (CYANOCOBALAMIN) 500 MCG  tablet [id number] Yes Take 5,000 mcg by mouth daily.  [provider] Taking Active Self            Patient Active Problem List   Diagnosis Date Noted   Tubular adenoma 12/08/2020   Tobacco abuse 12/08/2020   Disorder of rotator cuff of both shoulders 12/07/2020   History of gastrointestinal stromal tumor (GIST) 07/29/2020   History of lumbosacral spine surgery 07/29/2020   Low testosterone in male 05/14/2020   Obesity (BMI 30.0-34.9) 02/26/2020   Hypertension associated with diabetes ([place]) 01/29/2020   Shoulder pain, right 01/27/2020   Aortic atherosclerosis ([place]) 01/27/2020   Emphysema of lung ([place]) 01/27/2020   Hypogonadism in male 12/11/2019   OSA on CPAP 09/19/2019   Vertigo 09/17/2018   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[place])  09/17/2018   Hyperlipidemia 06/18/2018   Excessive cerumen in both ear canals 05/16/2018   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   Elevated TSH 03/28/2018   Lung nodule 03/28/2018   CTS (carpal tunnel syndrome) 03/28/2018   DM2 (diabetes mellitus, type 2) ([place]) 03/25/2018   CAD (coronary artery disease) 03/25/2018   Arthritis 03/25/2018   Hepatic steatosis 03/25/2018   Vitamin D deficiency 03/25/2018   Long term use of drug 01/23/2018   Cervical spondylosis with myelopathy 11/21/2017   Spinal stenosis of lumbar region at multiple levels 11/21/2017   Personal history of tobacco use, presenting hazards to health 07/13/2015   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    Immunization History  Administered Date(s) Administered   Influenza-Unspecified 05/01/2019   Pneumococcal Conjugate-13 05/23/2016   Pneumococcal Polysaccharide-23 06/17/2019   Zoster, Live 02/13/2017    Conditions to be addressed/monitored: CAD, HTN, HLD, Hypertriglyceridemia, and DMII  Care Plan : Medication Management  Updates made by De Hollingshead,  RPH-CPP since 01/06/2021 12:00 AM     Problem: Diabetes, Hyperlipidemia      Long-Range Goal: Disease Progression Prevention   This Visit's Progress: On track  Recent Progress: On track  Priority: High  Note:   Current Barriers:  Unable to independently afford treatment regimen Unable to achieve control of diabetes   Pharmacist Clinical Goal(s):  Over the next 90 days, patient will verbalize ability to afford treatment regimen. Over the next 90 days, patient will  achieve adherence to monitoring guidelines and medication adherence to achieve therapeutic efficacy. Over the next 90 days, patient will maintain control of diabetes and hyperlipidemia as evidenced by labwork through collaboration with PharmD and provider.   Interventions: 1:1 collaboration with McLean-Scocuzza, Nino Glow, MD regarding development and update of comprehensive plan of care as evidenced by provider attestation and co-signature Inter-disciplinary care team collaboration (see longitudinal plan of care) Comprehensive medication review performed; medication list updated in electronic medical record  SDOH Reports that his son that lives in [place] has just been booked to go on tour. He is incredibly proud of him and is looking forward to seeing him play [place] in August.   Diabetes: Uncontrolled but continuously improving; current treatment: Rybelsus 7 mg daily (max tolerated dose d/t GI upset), metformin XR 1000 mg BID, Jardiance 25 mg daily Approved for patient assistance for Rybelsus, Jardiance through 06/2021 Current meals: reports he continues to get full easily. Discussed ensuring that he is consuming lean proteins, fresh fruits and vegetables, whole grains when available.  Current physical activity: very little Discussed benefit of continued focus on physical activity. Discussed that we could avoid addition of other medications with incorporation of more physical activity.   Praised for continued weight loss.    Hypertension: Controlled per last clinic reading; current treatment: lisinopril 2.5 mg QAM, metoprolol tartrate 50 mg BID Has not checked home readings lately.  Recommended to continue current regimen. Encourage benefit of home BP monitoring  Hyperlipidemia, ASCVD Secondary Prevention: Controlled; current treatment: ezetimibe 10 mg daily, rosuvastatin 40 mg (just increased), but does not appear this dose has been filled, Vascepa 2 g BID; follows with Dr. Saralyn Pilar [employer] funding enrollment period has ended and the grant is currently closed. Copay of $119/30 day of Vascepa is more than he is able to pay for this medication.   Antiplatelet therapy: aspirin 81 mg daily w/ clopidogrel 75 mg daily Discussed preference of prescription omega 3 fatty acids or  fenofibrate for TG lowering rather than OTC given lack of outcomes data, potential lack of consistency in benefit. Sent script for generic Lovaza to pharmacy to investigate copay. Copay was $90/30 day, which is too expensive for him at this time. Sent script for fenofibrate 134 mg daily. Copay is $90 on insurance, but cash price without insurance is $30/90 days. Stop Vascepa, start fenofibrate 143 mg daily. Monitor for muscle aches/pains. CMP scheduled in July  Chronic back/hip pain: Moderately well controlled; current regimen: acetaminophen 1000 mg BID, meloxicam 7.5 mg BID; Discussed steroid injections. Patient wanted to contemplate.  Hip pain - x-ray suggestive for arthritis. Previously discussed benefit of around the clock acetaminophen.   Patient Goals/Self-Care Activities Over the next 90 days, patient will:  - focus on medication adherence by collaborating with team on medication access solutions - check blood glucose twice daily, document, and provide at future appointments  Follow Up Plan: Telephone follow up appointment with care management team member scheduled for: ~ 12 weeks     Medication Assistance: Rybelsus, Jardiance  obtained through [place] medication assistance program.  Enrollment ends 06/29/21 for Rybelsus, 07/30/21 for Jardiance  Patient's preferred pharmacy is:  [place], [place] [address] [place] [address] Phone: [phone] Fax: [phone]   Follow Up:  Patient agrees to Care Plan and Follow-up.  Plan: Telephone follow up appointment with care management team member scheduled for:  12 weeks  [name], PharmD, [place], [place] Clinical Pharmacist [employer] at [employer] [phone]

## 2021-01-06 NOTE — Patient Instructions (Signed)
Visit Information  PATIENT GOALS:  Goals Addressed               This Visit's Progress     Patient Stated     Medication Management (pt-stated)         Patient Goals/Self-Care Activities Over the next 90 days, patient will:  - focus on medication adherence by collaborating with team on medication access solutions - check blood glucose twice daily, document, and provide at future appointments         Patient verbalizes understanding of instructions provided today and agrees to view in [place].    Plan: Telephone follow up appointment with care management team member scheduled for:  12 weeks  [name], PharmD, [place], [place] Clinical Pharmacist [employer] at [employer] [phone]

## 2021-01-20 ENCOUNTER — Telehealth: Payer: Self-pay

## 2021-01-20 NOTE — Telephone Encounter (Signed)
Patient was informed of his medication is ready to be picked up from patient assistance. Medication was placed up front for pick up.

## 2021-02-07 ENCOUNTER — Telehealth: Payer: Self-pay | Admitting: *Deleted

## 2021-02-07 NOTE — Telephone Encounter (Signed)
Called patient to notify him that it is time to schedule annual low dose lung cancer screening CT scan. Spoke to patient via telephone, and patient stated that he does not want to follow up or do any further scans.

## 2021-02-08 NOTE — Telephone Encounter (Signed)
Noted  

## 2021-02-14 ENCOUNTER — Other Ambulatory Visit: Payer: Medicare Other

## 2021-02-16 ENCOUNTER — Encounter: Payer: Self-pay | Admitting: Internal Medicine

## 2021-02-16 ENCOUNTER — Ambulatory Visit (INDEPENDENT_AMBULATORY_CARE_PROVIDER_SITE_OTHER): Payer: Medicare Other | Admitting: Internal Medicine

## 2021-02-16 ENCOUNTER — Other Ambulatory Visit (INDEPENDENT_AMBULATORY_CARE_PROVIDER_SITE_OTHER): Payer: Medicare Other

## 2021-02-16 ENCOUNTER — Other Ambulatory Visit: Payer: Self-pay

## 2021-02-16 VITALS — BP 169/66 | HR 94 | Temp 97.7°F | Ht 72.1 in | Wt 232.8 lb

## 2021-02-16 DIAGNOSIS — I10 Essential (primary) hypertension: Secondary | ICD-10-CM

## 2021-02-16 DIAGNOSIS — S91301D Unspecified open wound, right foot, subsequent encounter: Secondary | ICD-10-CM | POA: Diagnosis not present

## 2021-02-16 DIAGNOSIS — E1159 Type 2 diabetes mellitus with other circulatory complications: Secondary | ICD-10-CM

## 2021-02-16 DIAGNOSIS — I152 Hypertension secondary to endocrine disorders: Secondary | ICD-10-CM

## 2021-02-16 DIAGNOSIS — T148XXA Other injury of unspecified body region, initial encounter: Secondary | ICD-10-CM | POA: Diagnosis not present

## 2021-02-16 LAB — LDL CHOLESTEROL, DIRECT: Direct LDL: 43 mg/dL

## 2021-02-16 LAB — COMPREHENSIVE METABOLIC PANEL
ALT: 15 U/L (ref 0–53)
AST: 17 U/L (ref 0–37)
Albumin: 4.4 g/dL (ref 3.5–5.2)
Alkaline Phosphatase: 63 U/L (ref 39–117)
BUN: 17 mg/dL (ref 6–23)
CO2: 28 mEq/L (ref 19–32)
Calcium: 9.9 mg/dL (ref 8.4–10.5)
Chloride: 101 mEq/L (ref 96–112)
Creatinine, Ser: 1.04 mg/dL (ref 0.40–1.50)
GFR: 72.73 mL/min (ref >60.00)
Glucose, Bld: 122 mg/dL — ABNORMAL HIGH (ref 70–99)
Potassium: 4.4 mEq/L (ref 3.5–5.1)
Sodium: 138 mEq/L (ref 135–145)
Total Bilirubin: 0.5 mg/dL (ref 0.2–1.2)
Total Protein: 7.1 g/dL (ref 6.0–8.3)

## 2021-02-16 LAB — LIPID PANEL
Cholesterol: 103 mg/dL (ref 0–200)
HDL: 34.6 mg/dL — ABNORMAL LOW (ref >39.00)
NonHDL: 68.68
Total CHOL/HDL Ratio: 3
Triglycerides: 312 mg/dL — ABNORMAL HIGH (ref 0.0–149.0)
VLDL: 62.4 mg/dL — ABNORMAL HIGH (ref 0.0–40.0)

## 2021-02-16 LAB — HEMOGLOBIN A1C: Hgb A1c MFr Bld: 7.6 % — ABNORMAL HIGH (ref 4.6–6.5)

## 2021-02-16 MED ORDER — MUPIROCIN 2 % EX OINT
1.0000 | TOPICAL_OINTMENT | Freq: Two times a day (BID) | CUTANEOUS | 0 refills | Status: DC
Start: 2021-02-16 — End: 2021-06-09

## 2021-02-16 NOTE — Patient Instructions (Addendum)
Dr. Lara Mulch 2-3 x per day   Check blood pressure Thursday or Friday 2 hours after medications goal <130/<80   [email or web address]">  DASH Eating Plan DASH stands for Dietary Approaches to Stop Hypertension. The DASH eating plan is a healthy eating plan that has been shown to: Reduce high blood pressure (hypertension). Reduce your risk for type 2 diabetes, heart disease, and stroke. Help with weight loss. What are tips for following this plan? Reading food labels Check food labels for the amount of salt (sodium) per serving. Choose foods with less than 5 percent of the Daily Value of sodium. Generally, foods with less than 300 milligrams (mg) of sodium per serving fit into this eating plan. To find whole grains, look for the word "whole" as the first word in the ingredient list. Shopping Buy products labeled as "low-sodium" or "no salt added." Buy fresh foods. Avoid canned foods and pre-made or frozen meals. Cooking Avoid adding salt when cooking. Use salt-free seasonings or herbs instead of table salt or sea salt. Check with your health care provider or pharmacist before using salt substitutes. Do not fry foods. Cook foods using healthy methods such as baking, boiling, grilling, roasting, and broiling instead. Cook with heart-healthy oils, such as olive, canola, avocado, soybean, or sunflower oil. Meal planning  Eat a balanced diet that includes: 4 or more servings of fruits and 4 or more servings of vegetables each day. Try to fill one-half of your plate with fruits and vegetables. 6-8 servings of whole grains each day. Less than 6 oz (170 g) of lean meat, poultry, or fish each day. A 3-oz (85-g) serving of meat is about the same size as a deck of cards. One egg equals 1 oz (28 g). 2-3 servings of low-fat dairy each day. One serving is 1 cup (237 mL). 1 serving of nuts, seeds, or beans 5 times each week. 2-3 servings of  heart-healthy fats. Healthy fats called omega-3 fatty acids are found in foods such as walnuts, flaxseeds, fortified milks, and eggs. These fats are also found in cold-water fish, such as sardines, salmon, and mackerel. Limit how much you eat of: Canned or prepackaged foods. Food that is high in trans fat, such as some fried foods. Food that is high in saturated fat, such as fatty meat. Desserts and other sweets, sugary drinks, and other foods with added sugar. Full-fat dairy products. Do not salt foods before eating. Do not eat more than 4 egg yolks a week. Try to eat at least 2 vegetarian meals a week. Eat more home-cooked food and less restaurant, buffet, and fast food.  Lifestyle When eating at a restaurant, ask that your food be prepared with less salt or no salt, if possible. If you drink alcohol: Limit how much you use to: 0-1 drink a day for women who are not pregnant. 0-2 drinks a day for men. Be aware of how much alcohol is in your drink. In the [place] one drink equals one 12 oz bottle of beer (355 mL), one 5 oz glass of wine (148 mL), or one 1 oz glass of hard liquor (44 mL). General information Avoid eating more than 2,300 mg of salt a day. If you have hypertension, you may need to reduce your sodium intake to 1,500 mg a day. Work with your health care provider to maintain a healthy body weight or to lose weight. Ask what an ideal weight is for you. Get at least 30 minutes of exercise that causes  your heart to beat faster (aerobic exercise) most days of the week. Activities may include walking, swimming, or biking. Work with your health care provider or dietitian to adjust your eating plan to your individual calorie needs. What foods should I eat? Fruits All fresh, dried, or frozen fruit. Canned fruit in natural juice (without addedsugar). Vegetables Fresh or frozen vegetables (raw, steamed, roasted, or grilled). Low-sodium or reduced-sodium tomato and vegetable juice.  Low-sodium or reduced-sodium tomatosauce and tomato paste. Low-sodium or reduced-sodium canned vegetables. Grains Whole-grain or whole-wheat bread. Whole-grain or whole-wheat pasta. Brown rice. [place]. Bulgur. Whole-grain and low-sodium cereals. Pita bread.Low-fat, low-sodium crackers. Whole-wheat flour tortillas. Meats and other proteins Skinless chicken or [place]. Ground chicken or [place]. Pork with fat trimmed off. Fish and seafood. Egg whites. Dried beans, peas, or lentils. Unsalted nuts, nut butters, and seeds. Unsalted canned beans. Lean cuts of beef with fat trimmed off. Low-sodium, lean precooked or cured meat, such as sausages or meatloaves. Dairy Low-fat (1%) or fat-free (skim) milk. Reduced-fat, low-fat, or fat-free cheeses. Nonfat, low-sodium ricotta or cottage cheese. Low-fat or nonfatyogurt. Low-fat, low-sodium cheese. Fats and oils Soft margarine without trans fats. Vegetable oil. Reduced-fat, low-fat, or light mayonnaise and salad dressings (reduced-sodium). Canola, safflower, olive, avocado, soybean, andsunflower oils. Avocado. Seasonings and condiments Herbs. Spices. Seasoning mixes without salt. Other foods Unsalted popcorn and pretzels. Fat-free sweets. The items listed above may not be a complete list of foods and beverages you can eat. Contact a dietitian for more information. What foods should I avoid? Fruits Canned fruit in a light or heavy syrup. Fried fruit. Fruit in cream or buttersauce. Vegetables Creamed or fried vegetables. Vegetables in a cheese sauce. Regular canned vegetables (not low-sodium or reduced-sodium). Regular canned tomato sauce and paste (not low-sodium or reduced-sodium). Regular tomato and vegetable juice(not low-sodium or reduced-sodium). Angie Fava. Olives. Grains Baked goods made with fat, such as croissants, muffins, or some breads. Drypasta or rice meal packs. Meats and other proteins Fatty cuts of meat. Ribs. Fried meat. Berniece Salines. Bologna,  salami, and other precooked or cured meats, such as sausages or meat loaves. Fat from the back of a pig (fatback). Bratwurst. Salted nuts and seeds. Canned beans with added salt. Canned orsmoked fish. Whole eggs or egg yolks. Chicken or [place] with skin. Dairy Whole or 2% milk, cream, and half-and-half. Whole or full-fat cream cheese. Whole-fat or sweetened yogurt. Full-fat cheese. Nondairy creamers. Whippedtoppings. Processed cheese and cheese spreads. Fats and oils Butter. Stick margarine. Lard. Shortening. Ghee. Bacon fat. Tropical oils, suchas coconut, palm kernel, or palm oil. Seasonings and condiments Onion salt, garlic salt, seasoned salt, table salt, and sea salt. Worcestershire sauce. Tartar sauce. Barbecue sauce. Teriyaki sauce. Soy sauce, including reduced-sodium. Steak sauce. Canned and packaged gravies. Fish sauce. Oyster sauce. Cocktail sauce. Store-bought horseradish. Ketchup. Mustard. Meat flavorings and tenderizers. Bouillon cubes. Hot sauces. Pre-made or packaged marinades. Pre-made or packaged taco seasonings. Relishes. Regular saladdressings. Other foods Salted popcorn and pretzels. The items listed above may not be a complete list of foods and beverages you should avoid. Contact a dietitian for more information. Where to find more information National Heart, Lung, and Blood Institute: https://wilson-eaton.com/ American Heart Association: www.heart.org Academy of Nutrition and Dietetics: www.eatright.[place]: www.kidney.org Summary The DASH eating plan is a healthy eating plan that has been shown to reduce high blood pressure (hypertension). It may also reduce your risk for type 2 diabetes, heart disease, and stroke. When on the DASH eating plan, aim to eat more fresh  fruits and vegetables, whole grains, lean proteins, low-fat dairy, and heart-healthy fats. With the DASH eating plan, you should limit salt (sodium) intake to 2,300 mg a day. If you have  hypertension, you may need to reduce your sodium intake to 1,500 mg a day. Work with your health care provider or dietitian to adjust your eating plan to your individual calorie needs. This information is not intended to replace advice given to you by your health care provider. Make sure you discuss any questions you have with your healthcare provider. Document Revised: 06/20/2019 Document Reviewed: 06/20/2019 Elsevier Patient Education  2022 [employer].

## 2021-02-16 NOTE — Progress Notes (Signed)
Chief Complaint  Patient presents with   Wound Check   F/u  1. Right posterior heel/foot with wound since early 11/2020 played golf 5/5-12/05/20 and braces and shoes rubbed and area was painful and irritated now ttp and not healing tried bandaids, guaze/telfa/neosporin and not healing  Also needs to know if braces need to be revised refer [facility] podiatry dr. Cleda Mccreedy if wound not healing [facility] wound [facility]   2. Htn elevated today lis 2.5 mg qd, lopressor 50 mg bid had country ham at [employer]    Review of Systems  Constitutional:  Negative for weight loss.  HENT:  Negative for hearing loss.   Eyes:  Negative for blurred vision.  Respiratory:  Negative for shortness of breath.   Cardiovascular:  Negative for chest pain.  Skin:  Negative for rash.  Neurological:  Negative for dizziness and headaches.  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   cancer; resected   History of kidney stones    Hypercholesterolemia    Hypertension    Myocardial infarction [facility]) 2004   Personal history of tobacco use, presenting hazards to health 07/13/2015   Pneumonia    Sleep apnea    CPAP   Stenosis of hepatic artery (HCC)    Vertigo    none in approx 1 yr   Wears dentures    partial upper   Wears hearing aid in both ears    Past Surgical History:  Procedure Laterality Date   BACK SURGERY     01/29/18 [facility] lower  back    CARDIAC CATHETERIZATION  2004   1 stent   CARPAL TUNNEL RELEASE Bilateral    CATARACT EXTRACTION W/PHACO Left 06/04/2019   Procedure: CATARACT EXTRACTION PHACO AND INTRAOCULAR LENS PLACEMENT ([place]) LEFT DIABETIC 01:08.3  18.6%  12.76;   Surgeon: Leandrew Koyanagi, MD;  Location: [facility];  Service: Ophthalmology;  Laterality: Left;  diabetic - oral meds sleep apnea   CATARACT EXTRACTION W/PHACO Right 07/02/2019   Procedure: CATARACT EXTRACTION PHACO AND INTRAOCULAR LENS PLACEMENT ([place]) Right DIABETIC;  Surgeon: Leandrew Koyanagi, MD;  Location: [facility];  Service: Ophthalmology;  Laterality: Right;  diabetic-oral med sleep apnea (CPAP)   COLONOSCOPY WITH PROPOFOL N/A 03/26/2015   Procedure: COLONOSCOPY WITH PROPOFOL;  Surgeon: Josefine Class, MD;  Location: [facility] ENDOSCOPY;  Service: Endoscopy;  Laterality: N/A;   KNEE SURGERY     TKR   KNEE SURGERY     b/l partial knee Dr. Leanna Sato    PARTIAL GASTRECTOMY     2016 [facility]   Family History  Problem Relation Age of Onset   Diabetes Mellitus II Maternal Grandfather    Heart disease Maternal Grandfather    CAD Paternal Grandfather    Cancer Paternal Grandfather        ? type   Arthritis Mother    Depression Mother    Hearing loss Mother    Diabetes Father    Cancer Brother        throat smoker  Hearing loss Maternal Grandmother    Arthritis Maternal Grandmother    Social History   Socioeconomic History   Marital status: Married    Spouse name: Not on file   Number of children: Not on file   Years of education: Not on file   Highest education level: Not on file  Occupational History   Not on file  Tobacco Use   Smoking status: Some Days    Packs/day: 0.50    Years: 50.00    Pack years: 25.00    Types: Cigarettes   Smokeless tobacco: Never   Tobacco comments:    1 ppd or a little less (since age 28)  88 Use   Vaping Use: Former  Substance and Sexual Activity   Alcohol use: No    Comment: may have drink 3-4x/yr   Drug use: No   Sexual activity: Yes    Birth control/protection: None  Other Topics Concern   Not on file  Social History Narrative   Owns guns    Wears seat belt    Safe in relationship    Married  2 sons    -married 76 years as of 09/2019    On disability    Assoc degree and prev supervisor    Retired as of 09/2019 end of month       Social Determinants of Health   Financial Resource Strain: Medium Risk   Difficulty of Paying Living Expenses: Somewhat hard  Food Insecurity: No Food Insecurity   Worried About Charity fundraiser in the Last Year: Never true   Arboriculturist in the Last Year: Never true  Transportation Needs: No Transportation Needs   Lack of Transportation (Medical): No   Lack of Transportation (Non-Medical): No  Physical Activity: Not on file  Stress: No Stress Concern Present   Feeling of Stress : Not at all  Social Connections: Unknown   Frequency of Communication with Friends and Family: Not on file   Frequency of Social Gatherings with Friends and Family: Not on file   Attends Religious Services: Not on file   Active Member of Clubs or Organizations: Not on file   Attends Archivist Meetings: Not on file   Marital Status: Married  Human resources officer Violence: Not At Risk   Fear of Current or Ex-Partner: No   Emotionally Abused: No   Physically Abused: No   Sexually Abused: No   Current Meds  Medication Sig   acetaminophen (TYLENOL) 500 MG tablet Take 1,000 mg by mouth in the morning and at bedtime.   aspirin 81 MG tablet Take 81 mg by mouth daily.   cholecalciferol (VITAMIN D) 25 MCG (1000 UNIT) tablet Take 1,000 Units by mouth daily.    clopidogrel (PLAVIX) 75 MG tablet Take 75 mg by mouth daily.   empagliflozin (JARDIANCE) 25 MG TABS tablet Take 1 tablet (25 mg total) by mouth daily before breakfast.   ezetimibe (ZETIA) 10 MG tablet TAKE ONE TABLET EVERY DAY   fenofibrate micronized (LOFIBRA) 134 MG capsule Take 1 capsule (134 mg total) by mouth daily before breakfast.   lisinopril (ZESTRIL) 2.5 MG tablet TAKE 1 TABLET BY MOUTH DAILY   MAGNESIUM PO Take by mouth daily.   metFORMIN (GLUCOPHAGE XR) 500 MG 24 hr tablet Take 2 tablets  (1,000 mg total) by mouth in the morning and at bedtime.   metoprolol tartrate (LOPRESSOR) 50 MG tablet Take 1 tablet (50 mg total) by mouth 2 (two) times daily.  Probiotic Product (PROBIOTIC DAILY PO) Take by mouth.   rosuvastatin (CRESTOR) 40 MG tablet Take 1 tablet (40 mg total) by mouth at bedtime.   Semaglutide (RYBELSUS) 7 MG TABS Take 7 mg by mouth daily.   vitamin B-12 (CYANOCOBALAMIN) 500 MCG tablet Take 5,000 mcg by mouth daily.    Allergies  Allergen Reactions   Ibuprofen Itching and Rash   Recent Results (from the past 2160 hour(s))  CBC with Differential/Platelet     Status: None   Collection Time: 12/07/20  3:55 PM  Result Value Ref Range   WBC 10.5 4.0 - 10.5 K/uL   RBC 4.56 4.22 - 5.81 Mil/uL   Hemoglobin 14.6 13.0 - 17.0 g/dL   HCT 43.4 39.0 - 52.0 %   MCV 95.2 78.0 - 100.0 fl   MCHC 33.7 30.0 - 36.0 g/dL   RDW 15.3 11.5 - 15.5 %   Platelets 168.0 150.0 - 400.0 K/uL   Neutrophils Relative % 60.9 43.0 - 77.0 %   Lymphocytes Relative 28.3 12.0 - 46.0 %   Monocytes Relative 6.3 3.0 - 12.0 %   Eosinophils Relative 3.9 0.0 - 5.0 %   Basophils Relative 0.6 0.0 - 3.0 %   Neutro Abs 6.4 1.4 - 7.7 K/uL   Lymphs Abs 3.0 0.7 - 4.0 K/uL   Monocytes Absolute 0.7 0.1 - 1.0 K/uL   Eosinophils Absolute 0.4 0.0 - 0.7 K/uL   Basophils Absolute 0.1 0.0 - 0.1 K/uL  Urine Culture     Status: None   Collection Time: 12/07/20  3:55 PM   Specimen: Urine   Urine  Result Value Ref Range   Urine Culture, Routine Final report    Organism ID, Bacteria Comment     Comment: Culture shows less than 10,000 colony forming units of bacteria per milliliter of urine. This colony count is not generally considered to be clinically significant.   Microalbumin / creatinine urine ratio     Status: None   Collection Time: 12/07/20  3:55 PM  Result Value Ref Range   Creatinine, Urine 57.8 Not Estab. mg/dL   Microalbumin, Urine <3.0 Not Estab. ug/mL   Microalb/Creat Ratio <5 0 - 29 mg/g creat     Comment:                        Normal:                0 -  29                        Moderately increased: 30 - 300                        Severely increased:       >300    Objective  Body mass index is 31.49 kg/m. Wt Readings from Last 3 Encounters:  02/16/21 232 lb 12.8 oz (105.6 kg)  12/07/20 233 lb (105.7 kg)  08/10/20 228 lb (103.4 kg)   Temp Readings from Last 3 Encounters:  02/16/21 97.7 F (36.5 C) (Oral)  12/07/20 98.9 F (37.2 C) (Oral)  07/27/20 98.9 F (37.2 C)   BP Readings from Last 3 Encounters:  02/16/21 (!) 169/66  12/07/20 124/68  07/27/20 110/60   Pulse Readings from Last 3 Encounters:  02/16/21 94  12/07/20 85  07/27/20 (!) 111    Physical Exam Vitals and nursing note reviewed.  Constitutional:  Appearance: Normal appearance. He is well-developed and well-groomed. He is obese.  HENT:     Head: Normocephalic and atraumatic.     Comments: +hearing aids b/l ears Cardiovascular:     Rate and Rhythm: Normal rate and regular rhythm.     Heart sounds: Normal heart sounds. No murmur heard. Pulmonary:     Effort: Pulmonary effort is normal.     Breath sounds: Normal breath sounds.  Musculoskeletal:       Feet:  Skin:    General: Skin is warm and moist.  Neurological:     General: No focal deficit present.     Mental Status: He is alert and oriented to person, place, and time. Mental status is at baseline.     Gait: Gait normal.  Psychiatric:        Attention and Perception: Attention and perception normal.        Mood and Affect: Mood and affect normal.        Speech: Speech normal.        Behavior: Behavior normal. Behavior is cooperative.        Thought Content: Thought content normal.        Cognition and Memory: Cognition and memory normal.        Judgment: Judgment normal.    Assessment  Plan  Open wound of right foot, subsequent encounter - Plan: Ambulatory referral to Podiatry, mupirocin ointment (BACTROBAN) 2 % Open wound  - Plan: mupirocin ointment (BACTROBAN) 2 %  May need new braces or orthotics as well   Htn  Cont meds had salty country ham today  Call back tomorrow or Friday with BP results after repeat   Provider: Dr. [name]

## 2021-02-16 NOTE — Progress Notes (Signed)
Patient states that he was down at the beach playing golf and had on new golfing shoes. States this rubbed the heel of his right foot raw. States he has had this wound since 12/05/20 and has been using Band-Aids with Neosporin.

## 2021-02-17 ENCOUNTER — Encounter: Payer: Self-pay | Admitting: Internal Medicine

## 2021-02-17 LAB — HM DIABETES EYE EXAM

## 2021-02-27 ENCOUNTER — Encounter: Payer: Self-pay | Admitting: Internal Medicine

## 2021-02-28 ENCOUNTER — Telehealth: Payer: Self-pay | Admitting: Internal Medicine

## 2021-02-28 NOTE — Telephone Encounter (Signed)
Patient is returning your call from earlier. 

## 2021-02-28 NOTE — Progress Notes (Signed)
Left message to return call 

## 2021-03-02 NOTE — Progress Notes (Signed)
Left message to return call 

## 2021-03-02 NOTE — Telephone Encounter (Signed)
Left message to return call. See result note

## 2021-03-03 NOTE — Telephone Encounter (Signed)
Pt called returning your call 

## 2021-03-07 NOTE — Telephone Encounter (Signed)
See result note message 

## 2021-05-23 ENCOUNTER — Ambulatory Visit: Payer: Medicare Other | Admitting: Pharmacist

## 2021-05-23 DIAGNOSIS — E1165 Type 2 diabetes mellitus with hyperglycemia: Secondary | ICD-10-CM

## 2021-05-23 DIAGNOSIS — I10 Essential (primary) hypertension: Secondary | ICD-10-CM

## 2021-05-23 DIAGNOSIS — E785 Hyperlipidemia, unspecified: Secondary | ICD-10-CM

## 2021-05-23 NOTE — Chronic Care Management (AMB) (Signed)
Chronic Care Management Pharmacy Note  05/23/2021 Name:  Kevin Ryan MRN:  [id number] DOB:  06-Jun-1951  Subjective: Kevin Ryan is an 70 y.o. year old male who is a primary patient of McLean-Scocuzza, Nino Glow, MD.  The CCM team was consulted for assistance with disease management and care coordination needs.    Engaged with patient by telephone for  medication access  for pharmacy case management and/or care coordination services.   Objective:  Medications Reviewed Today     Reviewed by Thressa Sheller, CMA (Certified Medical Assistant) on 02/16/21 at 1032  Med List Status: <None>   Medication Order Taking? Sig Documenting Provider Last Dose Status Informant  acetaminophen (TYLENOL) 500 MG tablet [id number] Yes Take 1,000 mg by mouth in the morning and at bedtime. [provider] Taking Active   aspirin 81 MG tablet [id number] Yes Take 81 mg by mouth daily. [provider] Taking Active Self  cholecalciferol (VITAMIN D) 25 MCG (1000 UNIT) tablet [id number] Yes Take 1,000 Units by mouth daily.  [provider] Taking Active   clopidogrel (PLAVIX) 75 MG tablet [id number] Yes Take 75 mg by mouth daily. [provider] Taking Active Self  empagliflozin (JARDIANCE) 25 MG TABS tablet [id number] Yes Take 1 tablet (25 mg total) by mouth daily before breakfast. McLean-Scocuzza, Nino Glow, MD Taking Active   ezetimibe (ZETIA) 10 MG tablet [id number] Yes TAKE ONE TABLET EVERY DAY McLean-Scocuzza, Nino Glow, MD Taking Active   fenofibrate micronized (LOFIBRA) 134 MG capsule [id number] Yes Take 1 capsule (134 mg total) by mouth daily before breakfast. McLean-Scocuzza, Nino Glow, MD Taking Active   lisinopril (ZESTRIL) 2.5 MG tablet [id number] Yes TAKE 1 TABLET BY MOUTH DAILY McLean-Scocuzza, Nino Glow, MD Taking Active   MAGNESIUM PO [id number] Yes Take by mouth daily. [provider] Taking Active   metFORMIN (GLUCOPHAGE XR) 500 MG 24 hr tablet [id number] Yes Take 2  tablets (1,000 mg total) by mouth in the morning and at bedtime. McLean-Scocuzza, Nino Glow, MD Taking Active   metoprolol tartrate (LOPRESSOR) 50 MG tablet [id number] Yes Take 1 tablet (50 mg total) by mouth 2 (two) times daily. McLean-Scocuzza, Nino Glow, MD Taking Active   mupirocin ointment (BACTROBAN) 2 % [id number] No Apply 1 application topically 2 (two) times daily.  Patient not taking: Reported on 02/16/2021   McLean-Scocuzza, Nino Glow, MD Not Taking Consider Medication Status and Discontinue   Probiotic Product (PROBIOTIC DAILY PO) [id number] Yes Take by mouth. [provider] Taking Active   rosuvastatin (CRESTOR) 40 MG tablet [id number] Yes Take 1 tablet (40 mg total) by mouth at bedtime. McLean-Scocuzza, Nino Glow, MD Taking Active   Semaglutide [facility]) 7 MG TABS [id number] Yes Take 7 mg by mouth daily. [provider] Taking Active   vitamin B-12 (CYANOCOBALAMIN) 500 MCG tablet [id number] Yes Take 5,000 mcg by mouth daily.  [provider] Taking Active Self            Lab Results  Component Value Date   HGBA1C 7.6 (H) 02/16/2021     Assessment/Interventions: Review of patient past medical history, allergies, medications, health status, including review of consultants reports, laboratory and other test data, was performed as part of comprehensive evaluation and provision of chronic care management services.   SDOH:  (Social Determinants of Health) assessments and interventions performed: Yes SDOH Interventions    Flowsheet Row Most Recent Value  SDOH Interventions   Financial Strain Interventions Other (Comment)  [manufacturer assistance]  CCM Care Plan  Review of patient past medical history, allergies, medications, health status, including review of consultants reports, laboratory and other test data, was performed as part of comprehensive evaluation and provision of chronic care management services.   Conditions to be addressed/monitored:   Hyperlipidemia and Diabetes  Care Plan : Medication Management  Updates made by De Hollingshead, RPH-CPP since 05/23/2021 12:00 AM     Problem: Diabetes, Hyperlipidemia      Long-Range Goal: Disease Progression Prevention   Recent Progress: On track  Priority: High  Note:   Current Barriers:  Unable to independently afford treatment regimen Unable to achieve control of diabetes   Pharmacist Clinical Goal(s):  Over the next 90 days, patient will verbalize ability to afford treatment regimen. Over the next 90 days, patient will  achieve adherence to monitoring guidelines and medication adherence to achieve therapeutic efficacy. Over the next 90 days, patient will maintain control of diabetes and hyperlipidemia as evidenced by labwork through collaboration with PharmD and provider.   Interventions: 1:1 collaboration with McLean-Scocuzza, Nino Glow, MD regarding development and update of comprehensive plan of care as evidenced by provider attestation and co-signature Inter-disciplinary care team collaboration (see longitudinal plan of care) Comprehensive medication review performed; medication list updated in electronic medical record   Diabetes: Uncontrolled but continuously improving; current treatment: Rybelsus 7 mg daily (max tolerated dose d/t GI upset), metformin XR 1000 mg BID, Jardiance 25 mg daily Approved for patient assistance for Rybelsus, Jardiance through 06/2021 [employer] cares reapplication for Jardiance assistance for 2023. He missed a few spots for him to sign. Will also prepare patient portion of Rybelsus application from [employer] and he will bring copy of 2021 tax return as well. Once this is received from him and PCP portions received, will pass along to [name] to submit to [employer]  Hypertension: Controlled per last clinic reading; current treatment: lisinopril 2.5 mg QAM, metoprolol tartrate 50 mg BID Previously recommended to continue current regimen.  Encourage benefit of home BP monitoring  Hyperlipidemia, ASCVD Secondary Prevention: Controlled; current treatment: ezetimibe 10 mg daily, rosuvastatin 40 mg (just increased), but does not appear this dose has been filled, fenofibrate 134 mg daily; follows with Dr. Saralyn Pilar [employer] funding enrollment period has ended and the grant is currently closed. Copay of $119/30 day of Vascepa is more than he is able to pay for this medication.   Antiplatelet therapy: aspirin 81 mg daily w/ clopidogrel 75 mg daily Previously recommended to continue current regimen at this time  Chronic back/hip pain: Moderately well controlled; current regimen: acetaminophen 1000 mg BID, meloxicam 7.5 mg BID;  Previously discussed benefit of around the clock acetaminophen.   Patient Goals/Self-Care Activities Over the next 90 days, patient will:  - focus on medication adherence by collaborating with team on medication access solutions - check blood glucose twice daily, document, and provide at future appointments  Follow Up Plan: Telephone follow up appointment with care management team member scheduled for: ~ 8 weeks as previously schduled      Plan: Telephone follow up appointment with care management team member scheduled for:  4 weeks as previously scheduled  [name], PharmD, [place], [place] Pharmacist [employer] at [place]

## 2021-05-23 NOTE — Patient Instructions (Signed)
Visit Information  PATIENT GOALS:  Goals Addressed               This Visit's Progress     Patient Stated     Medication Management (pt-stated)        Patient Goals/Self-Care Activities Over the next 90 days, patient will:  - focus on medication adherence by collaborating with team on medication access solutions - check blood glucose twice daily, document, and provide at future appointments        Patient verbalizes understanding of instructions provided today and agrees to view in [place].    Plan: Telephone follow up appointment with care management team member scheduled for:  4 weeks as previously scheduled  [name], PharmD, [place], [place] Clinical Pharmacist [employer] at [employer] [phone]

## 2021-05-26 ENCOUNTER — Telehealth: Payer: Self-pay | Admitting: Pharmacy Technician

## 2021-05-26 DIAGNOSIS — Z596 Low income: Secondary | ICD-10-CM

## 2021-05-26 NOTE — Progress Notes (Addendum)
[place] [facility]                                            [place] Team    05/26/2021  Kevin Ryan Dec 08, 1950 [id number]  FOR 2023 RE ENROLMENT                                      Medication Assistance Referral  Referral From: [facility] Embedded RPh [name]   Medication/Company: [name] Patient application portion:  N/A patient signed in clinic with PharmD Provider application portion:  N/A signed in clinic  to Dr. [name] Provider address/fax verified via: Office website  Medication/Company:Rybelsus/ [employer] Patient application portion:  N/A patient signed in clinic with PharmD Provider application portion:  N/A signed in clinic  to Dr. [name] Provider address/fax verified via: Office website   [name] [name], [name]  [phone]

## 2021-05-31 ENCOUNTER — Telehealth: Payer: Self-pay | Admitting: Pharmacy Technician

## 2021-05-31 DIAGNOSIS — Z596 Low income: Secondary | ICD-10-CM

## 2021-05-31 NOTE — Progress Notes (Signed)
[place] [facility])                                            [place] Team    05/31/2021  Kevin Ryan Jul 16, 1951 [id number]  Received both patient and provider portion(s) of patient assistance application(s) for Jardiance and Rybelsus. Faxed completed application and required documents into [employer] and [employer] for 2023.    Kevin Ryan P. Davarion Cuffee, [name]  [phone]

## 2021-06-09 ENCOUNTER — Encounter: Payer: Self-pay | Admitting: Internal Medicine

## 2021-06-09 ENCOUNTER — Ambulatory Visit (INDEPENDENT_AMBULATORY_CARE_PROVIDER_SITE_OTHER): Payer: Medicare Other | Admitting: Internal Medicine

## 2021-06-09 ENCOUNTER — Other Ambulatory Visit: Payer: Self-pay

## 2021-06-09 VITALS — BP 114/66 | HR 76 | Temp 97.0°F | Ht 72.1 in | Wt 234.2 lb

## 2021-06-09 DIAGNOSIS — I152 Hypertension secondary to endocrine disorders: Secondary | ICD-10-CM

## 2021-06-09 DIAGNOSIS — Z0000 Encounter for general adult medical examination without abnormal findings: Secondary | ICD-10-CM | POA: Diagnosis not present

## 2021-06-09 DIAGNOSIS — Z23 Encounter for immunization: Secondary | ICD-10-CM | POA: Diagnosis not present

## 2021-06-09 DIAGNOSIS — E785 Hyperlipidemia, unspecified: Secondary | ICD-10-CM | POA: Diagnosis not present

## 2021-06-09 DIAGNOSIS — E1159 Type 2 diabetes mellitus with other circulatory complications: Secondary | ICD-10-CM | POA: Diagnosis not present

## 2021-06-09 DIAGNOSIS — I700 Atherosclerosis of aorta: Secondary | ICD-10-CM | POA: Diagnosis not present

## 2021-06-09 DIAGNOSIS — E119 Type 2 diabetes mellitus without complications: Secondary | ICD-10-CM

## 2021-06-09 DIAGNOSIS — I10 Essential (primary) hypertension: Secondary | ICD-10-CM

## 2021-06-09 DIAGNOSIS — E1165 Type 2 diabetes mellitus with hyperglycemia: Secondary | ICD-10-CM

## 2021-06-09 MED ORDER — LISINOPRIL 2.5 MG PO TABS: 2.5000 mg | ORAL_TABLET | Freq: Every day | ORAL | 3 refills | Status: DC

## 2021-06-09 MED ORDER — EZETIMIBE 10 MG PO TABS: 10.0000 mg | ORAL_TABLET | Freq: Every day | ORAL | 3 refills | Status: DC

## 2021-06-09 MED ORDER — FENOFIBRATE 134 MG PO CAPS: 134.0000 mg | ORAL_CAPSULE | Freq: Every day | ORAL | 3 refills | Status: DC

## 2021-06-09 MED ORDER — ZOSTER VAC RECOMB ADJUVANTED 50 MCG/0.5ML IM SUSR: 0.5000 mL | Freq: Once | INTRAMUSCULAR | 0 refills | Status: AC

## 2021-06-09 NOTE — Patient Instructions (Addendum)
EPA/Fish oil best quality 2000 mg 2x with food  Nature made  Cetaphil or cervacream  Alpha lipoic acid 600 mg 2x per day for nerve burning pain nature made   Zoster Vaccine, Recombinant injection What is this medication? ZOSTER VACCINE (ZOS ter vak SEEN) is a vaccine used to reduce the risk of getting shingles. This vaccine is not used to treat shingles or nerve pain from shingles. This medicine may be used for other purposes; ask your health care provider or pharmacist if you have questions. COMMON BRAND NAME(S): [facility] What should I tell my care team before I take this medication? They need to know if you have any of these conditions: cancer immune system problems an unusual or allergic reaction to Zoster vaccine, other medications, foods, dyes, or preservatives pregnant or trying to get pregnant breast-feeding How should I use this medication? This vaccine is injected into a muscle. It is given by a health care provider. A copy of Vaccine Information Statements will be given before each vaccination. Be sure to read this information carefully each time. This sheet may change often. Talk to your health care provider about the use of this vaccine in children. This vaccine is not approved for use in children. Overdosage: If you think you have taken too much of this medicine contact a poison control center or emergency room at once. NOTE: This medicine is only for you. Do not share this medicine with others. What if I miss a dose? Keep appointments for follow-up (booster) doses. It is important not to miss your dose. Call your health care provider if you are unable to keep an appointment. What may interact with this medication? medicines that suppress your immune system medicines to treat cancer steroid medicines like prednisone or cortisone This list may not describe all possible interactions. Give your health care provider a list of all the medicines, herbs, non-prescription drugs, or  dietary supplements you use. Also tell them if you smoke, drink alcohol, or use illegal drugs. Some items may interact with your medicine. What should I watch for while using this medication? Visit your health care provider regularly. This vaccine, like all vaccines, may not fully protect everyone. What side effects may I notice from receiving this medication? Side effects that you should report to your doctor or health care professional as soon as possible: allergic reactions (skin rash, itching or hives; swelling of the face, lips, or tongue) trouble breathing Side effects that usually do not require medical attention (report these to your doctor or health care professional if they continue or are bothersome): chills headache fever nausea pain, redness, or irritation at site where injected tiredness vomiting This list may not describe all possible side effects. Call your doctor for medical advice about side effects. You may report side effects to FDA at 1-800-FDA-1088. Where should I keep my medication? This vaccine is only given by a health care provider. It will not be stored at home. NOTE: This sheet is a summary. It may not cover all possible information. If you have questions about this medicine, talk to your doctor, pharmacist, or health care provider.  2022 Elsevier/Gold Standard (2021-04-05 00:00:00)

## 2021-06-09 NOTE — Progress Notes (Signed)
Chief Complaint  Patient presents with   Follow-up   Annual doing well  1. Dm 2 and htn a1c 7.6 02/16/21 on jardiance 3m qd zetia 10, lis 2.5 mg metformin xr 500 mg, crestor 40 mg lopressor 50 mg bid rybelsus 7 mg  2. Hld trigs >300 rec goal <150 on zetia 10, crestor 40 mg qd vasepa was too $, fenofibrate he has fish oill will try this      Review of Systems  Constitutional:  Negative for weight loss.  HENT:  Negative for hearing loss.   Eyes:  Negative for blurred vision.  Respiratory:  Negative for shortness of breath.   Cardiovascular:  Negative for chest pain.  Gastrointestinal:  Negative for abdominal pain and blood in stool.  Musculoskeletal:  Negative for back pain.  Skin:  Negative for rash.  Neurological:  Negative for headaches.  Psychiatric/Behavioral:  Negative for depression.   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; resected   History of kidney stones    Hypercholesterolemia    Hypertension    Myocardial infarction ([facility] 2004   Personal history of tobacco use, presenting hazards to health 07/13/2015   Pneumonia    Sleep apnea    CPAP   Stenosis of hepatic artery (HCC)    Vertigo    none in approx 1 yr   Wears dentures    partial upper   Wears hearing aid in both ears    Past Surgical History:  Procedure Laterality Date   BACK SURGERY     01/29/18 [facility] lower  back    CARDIAC CATHETERIZATION  2004   1 stent   CARPAL TUNNEL RELEASE Bilateral    CATARACT EXTRACTION W/PHACO Left 06/04/2019   Procedure: CATARACT EXTRACTION PHACO AND INTRAOCULAR LENS PLACEMENT ([place] LEFT DIABETIC 01:08.3  18.6%  12.76;   Surgeon: BLeandrew Koyanagi MD;  [facility]  Service: Ophthalmology;  Laterality: Left;  diabetic - oral meds sleep apnea   CATARACT EXTRACTION W/PHACO Right 07/02/2019   Procedure: CATARACT EXTRACTION PHACO AND INTRAOCULAR LENS PLACEMENT ([place] Right DIABETIC;  Surgeon: BLeandrew Koyanagi MD;  [facility]  Service: Ophthalmology;  Laterality: Right;  diabetic-oral med sleep apnea (CPAP)   COLONOSCOPY WITH PROPOFOL N/A 03/26/2015   Procedure: COLONOSCOPY WITH PROPOFOL;  Surgeon: MJosefine Class MD;  [facility];  Service: Endoscopy;  Laterality: N/A;   KNEE SURGERY     TKR   KNEE SURGERY     b/l partial knee Dr. ELeanna Sato   PARTIAL GASTRECTOMY     2016 [facility]   Family History  Problem Relation Age of Onset   Diabetes Mellitus II Maternal Grandfather    Heart disease Maternal Grandfather    CAD Paternal Grandfather    Cancer Paternal Grandfather        ? type   Arthritis Mother    Depression Mother    Hearing loss Mother    Diabetes Father    Cancer Brother  throat smoker    Hearing loss Maternal Grandmother    Arthritis Maternal Grandmother    Social History   Socioeconomic History   Marital status: Married    Spouse name: Not on file   Number of children: Not on file   Years of education: Not on file   Highest education level: Not on file  Occupational History   Not on file  Tobacco Use   Smoking status: Some Days    Packs/day: 0.50    Years: 50.00    Pack years: 25.00    Types: Cigarettes   Smokeless tobacco: Never   Tobacco comments:    1 ppd or a little less (since age 35)  50 Use   Vaping Use: Former  Substance and Sexual Activity   Alcohol use: No    Comment: may have drink 3-4x/yr   Drug use: No   Sexual activity: Yes    Birth control/protection: None  Other Topics Concern   Not on file  Social History Narrative   Owns guns    Wears seat belt    Safe in relationship    Married  2 sons    -married 37 years as of 09/2019    On disability    Assoc degree and prev supervisor    Retired as of 09/2019 end of month       Social Determinants of Health   Financial Resource Strain: Medium Risk   Difficulty of Paying Living Expenses: Somewhat hard  Food Insecurity: No Food Insecurity   Worried About Charity fundraiser in the Last Year: Never true   Arboriculturist in the Last Year: Never true  Transportation Needs: No Transportation Needs   Lack of Transportation (Medical): No   Lack of Transportation (Non-Medical): No  Physical Activity: Not on file  Stress: No Stress Concern Present   Feeling of Stress : Not at all  Social Connections: Unknown   Frequency of Communication with Friends and Family: Not on file   Frequency of Social Gatherings with Friends and Family: Not on file   Attends Religious Services: Not on file   Active Member of Clubs or Organizations: Not on file   Attends Archivist Meetings: Not on file   Marital Status: Married  Human resources officer Violence: Not At Risk   Fear of Current or Ex-Partner: No   Emotionally Abused: No   Physically Abused: No   Sexually Abused: No   Current Meds  Medication Sig   acetaminophen (TYLENOL) 500 MG tablet Take 1,000 mg by mouth in the morning and at bedtime.   aspirin 81 MG tablet Take 81 mg by mouth daily.   cholecalciferol (VITAMIN D) 25 MCG (1000 UNIT) tablet Take 1,000 Units by mouth daily.    clopidogrel (PLAVIX) 75 MG tablet Take 75 mg by mouth daily.   empagliflozin (JARDIANCE) 25 MG TABS tablet Take 1 tablet (25 mg total) by mouth daily before breakfast.   MAGNESIUM PO Take by mouth daily.   metFORMIN (GLUCOPHAGE XR) 500 MG 24 hr tablet Take 2 tablets (1,000 mg total) by mouth in the morning and at bedtime.   metoprolol tartrate (LOPRESSOR) 50 MG tablet Take 1 tablet (50 mg total) by mouth 2 (two) times daily.   Probiotic Product (PROBIOTIC DAILY PO) Take by mouth.   rosuvastatin (CRESTOR)  40 MG tablet Take 1 tablet (40 mg total) by mouth at bedtime.   Semaglutide (RYBELSUS) 7 MG TABS Take 7 mg by mouth  daily.   vitamin B-12 (CYANOCOBALAMIN) 500 MCG tablet Take 5,000 mcg by mouth daily.    Zoster Vaccine Adjuvanted [facility]) injection Inject 0.5 mLs into the muscle once for 1 dose. X 2 doses   [DISCONTINUED] ezetimibe (ZETIA) 10 MG tablet TAKE ONE TABLET EVERY DAY   [DISCONTINUED] lisinopril (ZESTRIL) 2.5 MG tablet TAKE 1 TABLET BY MOUTH DAILY   Allergies  Allergen Reactions   Ibuprofen Itching and Rash   No results found for this or any previous visit (from the past 2160 hour(s)). Objective  Body mass index is 31.68 kg/m. Wt Readings from Last 3 Encounters:  06/09/21 234 lb 3.2 oz (106.2 kg)  02/16/21 232 lb 12.8 oz (105.6 kg)  12/07/20 233 lb (105.7 kg)   Temp Readings from Last 3 Encounters:  06/09/21 (!) 97 F (36.1 C) (Temporal)  02/16/21 97.7 F (36.5 C) (Oral)  12/07/20 98.9 F (37.2 C) (Oral)   BP Readings from Last 3 Encounters:  06/09/21 114/66  02/16/21 (!) 169/66  12/07/20 124/68   Pulse Readings from Last 3 Encounters:  06/09/21 76  02/16/21 94  12/07/20 85    Physical Exam Vitals and nursing note reviewed.  Constitutional:      Appearance: Normal appearance. He is well-developed and well-groomed. He is obese.  HENT:     Head: Normocephalic and atraumatic.  Eyes:     Conjunctiva/sclera: Conjunctivae normal.     Pupils: Pupils are equal, round, and reactive to light.  Cardiovascular:     Rate and Rhythm: Normal rate and regular rhythm.     Heart sounds: Normal heart sounds.  Pulmonary:     Effort: Pulmonary effort is normal. No respiratory distress.     Breath sounds: Normal breath sounds.  Abdominal:     Tenderness: There is no abdominal tenderness.  Musculoskeletal:     Lumbar back: Tenderness present. Negative right straight leg raise test and negative left straight leg raise test.  Skin:    General: Skin is warm and moist.   Neurological:     General: No focal deficit present.     Mental Status: He is alert and oriented to person, place, and time. Mental status is at baseline.     Sensory: Sensation is intact.     Motor: Motor function is intact.     Coordination: Coordination is intact.     Gait: Gait is intact. Gait normal.  Psychiatric:        Attention and Perception: Attention and perception normal.        Mood and Affect: Mood and affect normal.        Speech: Speech normal.        Behavior: Behavior normal. Behavior is cooperative.        Thought Content: Thought content normal.        Cognition and Memory: Cognition and memory normal.        Judgment: Judgment normal.    Assessment  Plan  Annual  Flu shot given today  covid declines 2021  Tdap and shingles check [facility] need records  -Tdap no recently record per pt had in 2016 or 2017 in ED no record  prevnar utd  pna 23 utd  Consider shingrix consider in the future given rx today   rec hepA/hep B pt declines h/o fatty liver MMR immune   H/o colon polyps 03/26/15 polys x 3-4 tubular and hyperplastic repeat in 3-5 years [facility] Dr. Rayann Heman  Due 03/25/20 [facility] -referred  [facility] as of 12/07/20  06/14/21 colonscopy [place], [place]   PSA normal 0.41 11/11/20 B12 01/23/18 474    rec healthy diet and exercise    H/o lung nodules consider ct chest in future  -smoker age 21 to present max 1.5 ppd now <1 ppd no FH lung cancer rec cessation  -CT chest 10/24/19   IMPRESSION: 1. Lung-RADS 2, benign appearance or behavior. Continue annual screening with low-dose chest CT without contrast in 12 months. 2. Hepatic steatosis. 3.  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   Hypertension associated with diabetes ([place]) - Plan: Comprehensive metabolic panel, Lipid panel, CBC w/Diff, Hemoglobin A1c, TSH  jardiance 80m qd zetia 10, lis 2.5 mg metformin xr 500 mg, crestor 40 mg lopressor 50 mg bid rybelsus 7 mg  2. Hld trigs >300 rec goal <150 on zetia 10, crestor 40 mg qd vasepa was too $, fenofibrate he has fish oill will try this    Hyperlipidemia, unspecified hyperlipidemia type - Plan: ezetimibe (ZETIA) 10 MG tablet, fenofibrate micronized (LOFIBRA) 134 MG capsule Crestor 40 mg qhs  Fish oil 2g bid rec take otc    Provider: Dr. TOlivia MackieMcLean-Scocuzza-Internal Medicine

## 2021-06-25 IMAGING — DX DG HIP (WITH OR WITHOUT PELVIS) 2-3V*R*
2 series · 2 of 2 positions shown · non-contrast
Comparison: None.

CLINICAL DATA: Right hip pain

EXAM:
DG HIP (WITH OR WITHOUT PELVIS) 2-3V RIGHT

[pelvis standing ap]
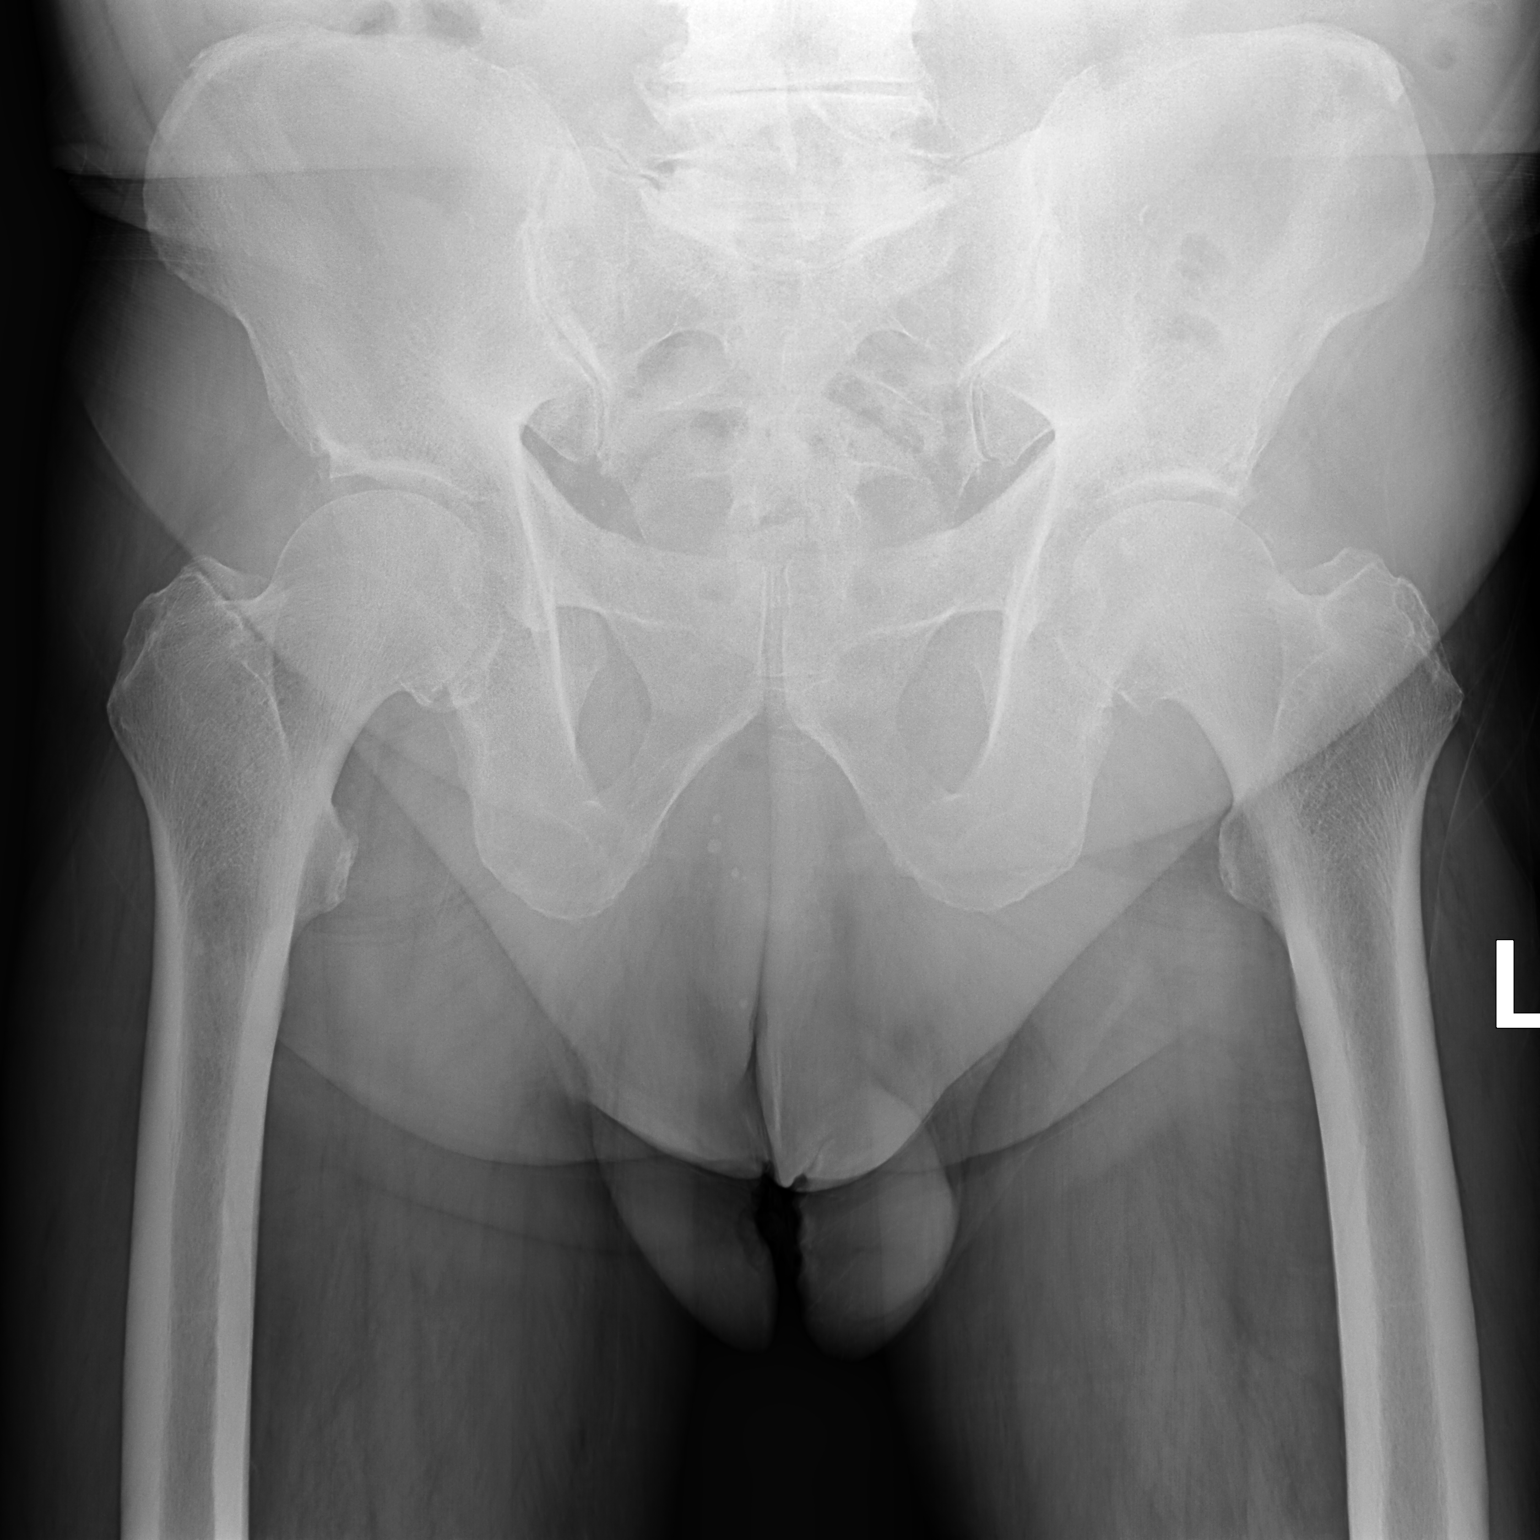

[ap frog obl (oblique)]
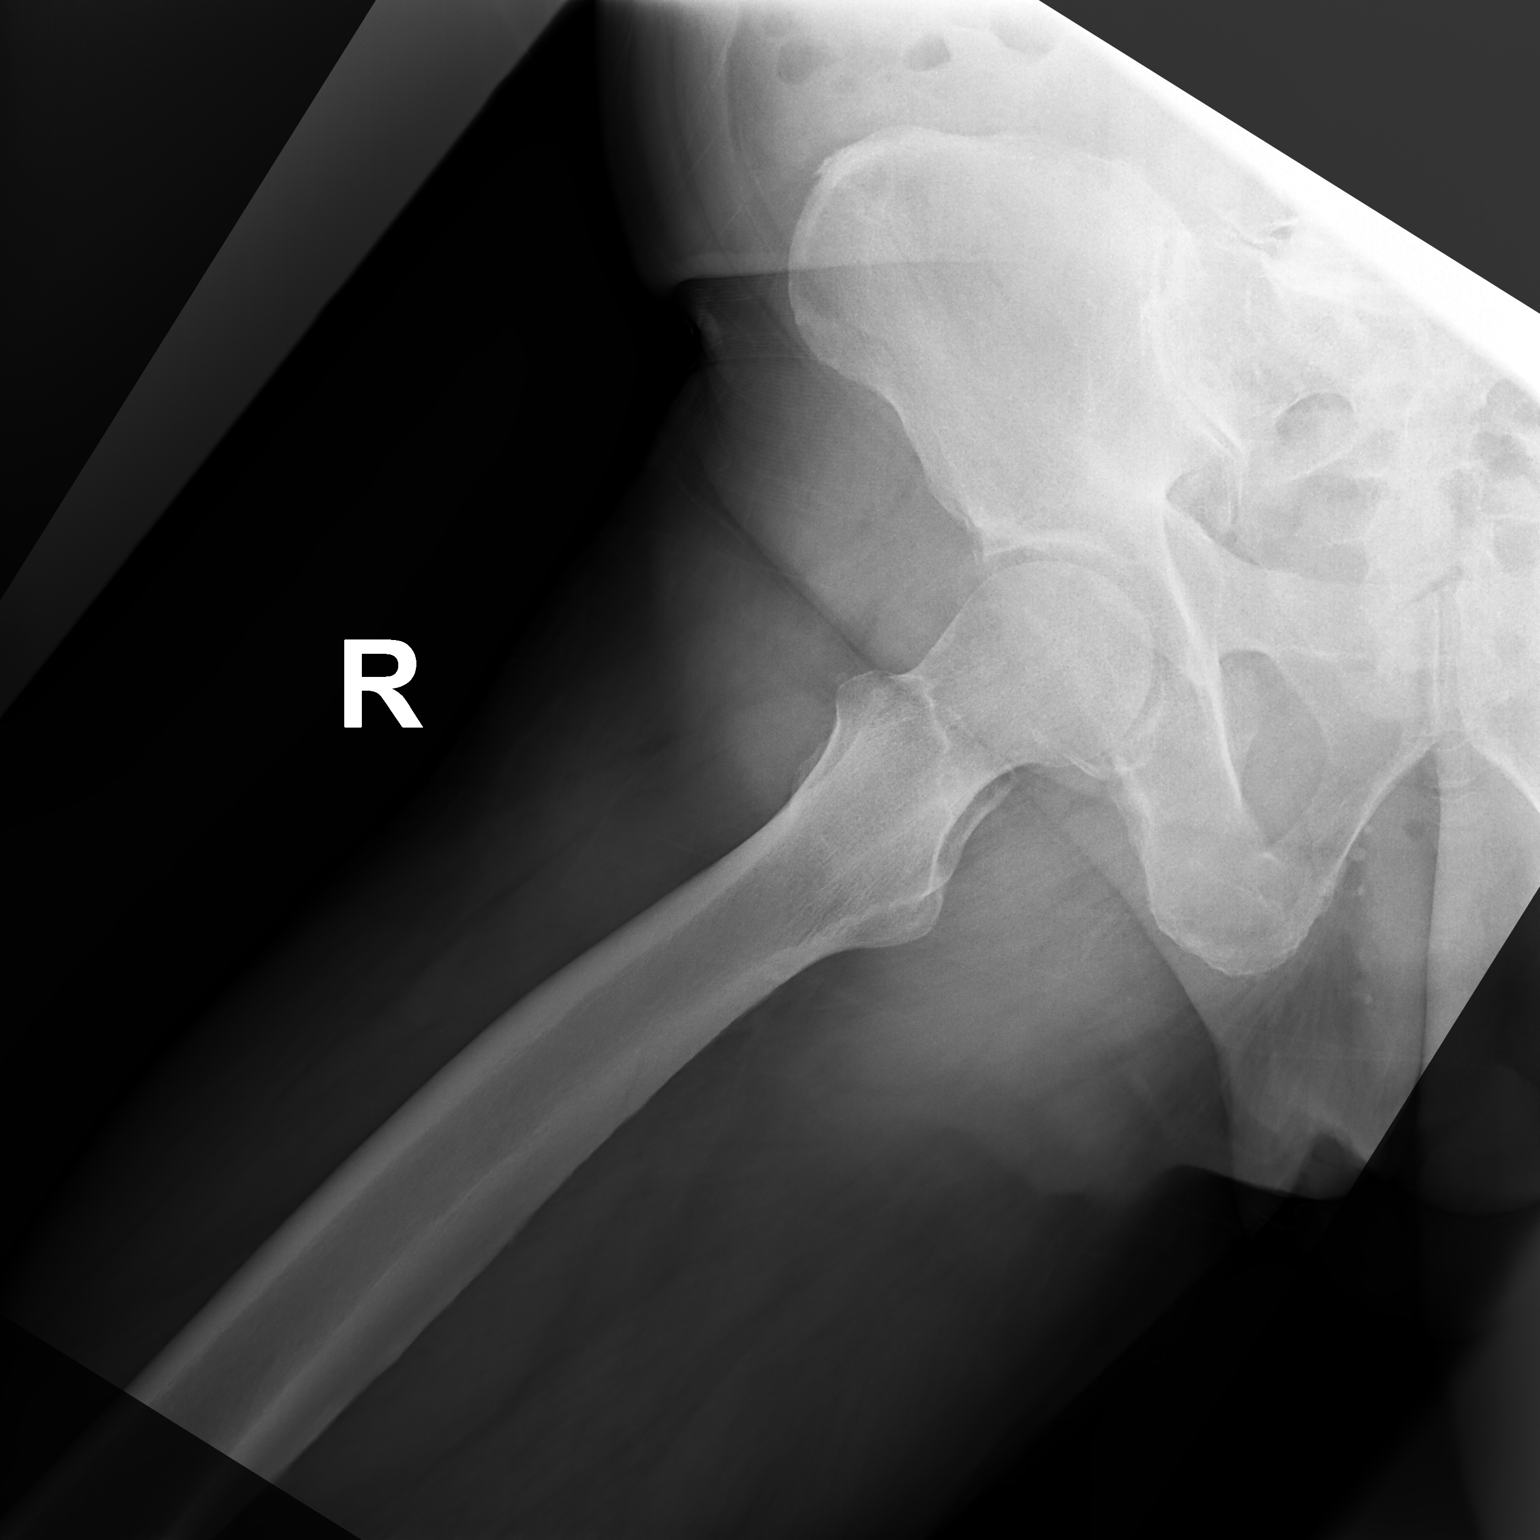

[2 of 2 positions shown; findings below may reference images not displayed]

FINDINGS: Early spurring in the hip joints bilaterally. SI joints symmetric
and unremarkable. No acute bony abnormality. Specifically, no
fracture, subluxation, or dislocation.
IMPRESSION: Early bilateral spurring.  No acute bony abnormality.

## 2021-06-30 ENCOUNTER — Telehealth: Payer: Self-pay | Admitting: Pharmacy Technician

## 2021-06-30 DIAGNOSIS — Z596 Low income: Secondary | ICD-10-CM

## 2021-06-30 NOTE — Progress Notes (Signed)
[name])                                            [place] Team    06/30/2021  Kevin Ryan 1951-01-08 [id number]  Care coordination call placed to [place] in regard to Rybelsus application.  Spoke to [place] who informed that as of 06/02/2021 patient has been APPROVED 07/31/21-07/30/22 for Rybelsus. She informed medication would begin to ship in January due to patient being enrolled in auto refills.  Will send in basket message to embedded PharmD [name] as Juluis Rainier.  [name] P. [name], [place]  [phone]

## 2021-07-01 ENCOUNTER — Telehealth: Payer: Self-pay | Admitting: Pharmacy Technician

## 2021-07-01 DIAGNOSIS — Z596 Low income: Secondary | ICD-10-CM

## 2021-07-01 NOTE — Progress Notes (Signed)
[place] [facility])                                            [place] Team    07/01/2021  Kevin Ryan [birth year] [id number]  Care coordination call placed to BI in regard to [place] determination for 2023.  Spoke to [place] who informs patient was APPROVED for 2023 effective 07/31/2021-12/31/202. She informs patient last 90 day shipment was sent out on 06/13/2021 and that the refill procedure would be the same for 2023 which will require the patient to call the company to re re order the medication.  Will send in basket message to embedded PharmD [name] as Juluis Rainier.  Kevin Ryan P. [name], [place]  [phone]

## 2021-07-18 ENCOUNTER — Ambulatory Visit (INDEPENDENT_AMBULATORY_CARE_PROVIDER_SITE_OTHER): Payer: Medicare Other | Admitting: Pharmacist

## 2021-07-18 ENCOUNTER — Telehealth: Payer: Self-pay | Admitting: Pharmacist

## 2021-07-18 ENCOUNTER — Other Ambulatory Visit (INDEPENDENT_AMBULATORY_CARE_PROVIDER_SITE_OTHER): Payer: Medicare Other

## 2021-07-18 DIAGNOSIS — R6883 Chills (without fever): Secondary | ICD-10-CM | POA: Diagnosis not present

## 2021-07-18 DIAGNOSIS — R52 Pain, unspecified: Secondary | ICD-10-CM

## 2021-07-18 DIAGNOSIS — J029 Acute pharyngitis, unspecified: Secondary | ICD-10-CM

## 2021-07-18 DIAGNOSIS — R059 Cough, unspecified: Secondary | ICD-10-CM

## 2021-07-18 DIAGNOSIS — E1165 Type 2 diabetes mellitus with hyperglycemia: Secondary | ICD-10-CM

## 2021-07-18 DIAGNOSIS — I2510 Atherosclerotic heart disease of native coronary artery without angina pectoris: Secondary | ICD-10-CM

## 2021-07-18 LAB — POC COVID19 BINAXNOW: SARS Coronavirus 2 Ag: NEGATIVE

## 2021-07-18 LAB — POCT INFLUENZA A/B
Influenza A, POC: NEGATIVE
Influenza B, POC: NEGATIVE

## 2021-07-18 LAB — POCT RAPID STREP A (OFFICE): Rapid Strep A Screen: NEGATIVE

## 2021-07-18 NOTE — Addendum Note (Signed)
Addended by: Thressa Sheller on: 07/18/2021 10:28 AM   Modules accepted: Orders

## 2021-07-18 NOTE — Progress Notes (Signed)
Patient presented for two nasal swabs and a throat swab for point of care testing of flu A/B, Strep, and COVID 19.   All testing came back negative. Patient tolerated swabbing, no complaints from Patient.

## 2021-07-18 NOTE — Telephone Encounter (Signed)
Spoke with patient for CCM visit, reports he is "sick" feeling symptoms of "being hit by a truck". Symptoms started Saturday - coughing, sneezing. Sunday - reports "full blown", sore throat, headache, achy. Has not checked his temperature but doesn't feel like he has a fever, but did feel like he was "cold" and he turned the heat up.   Scheduled for a phone visit with Sharyn Lull tomorrow morning (first available), but discussed testing today so that treatment can be started within 48 hours if positive for the flu.  Yves Dill, can you call to assist in plan for testing?  Ccing PCP, doctor of the day, Sharyn Lull for [employer]

## 2021-07-18 NOTE — Telephone Encounter (Signed)
Thank you Arianna,  POCT flu, covid, strep ok.

## 2021-07-18 NOTE — Patient Instructions (Signed)
Visit Information  Following are the goals we discussed today:  Patient Goals/Self-Care Activities Over the next 90 days, patient will:  - focus on medication adherence by collaborating with team on medication access solutions - check blood glucose twice daily, document, and provide at future appointments        Plan:  follow up telephone call in ~ 6 weeks   [name], PharmD, [place], [place] at [facility] [phone]       Please call the care guide team at [phone] if you need to cancel or reschedule your appointment.   Patient verbalizes understanding of instructions provided today and agrees to view in [place].

## 2021-07-18 NOTE — Chronic Care Management (AMB) (Signed)
Chronic Care Management CCM Pharmacy Note  07/18/2021 Name:  Kevin Ryan MRN:  [id number] DOB:  [birth year]  Summary: - Acute illness. Assisted in connecting to care today  Recommendations/Changes made from today's visit:  - Continue current regimen  Subjective: Kevin Ryan is an 70 y.o. year old male who is a primary patient of McLean-Scocuzza, Nino Glow, MD.  The CCM team was consulted for assistance with disease management and care coordination needs.    Engaged with patient by telephone for follow up visit for pharmacy case management and/or care coordination services.   Objective:  Medications Reviewed Today     Reviewed by McLean-Scocuzza, Nino Glow, MD (Physician) on 06/09/21 at 1524  Med List Status: <None>   Medication Order Taking? Sig Documenting Provider Last Dose Status Informant  acetaminophen (TYLENOL) 500 MG tablet [id number] Yes Take 1,000 mg by mouth in the morning and at bedtime. [provider] Taking Active   aspirin 81 MG tablet [id number] Yes Take 81 mg by mouth daily. [provider] Taking Active Self  cholecalciferol (VITAMIN D) 25 MCG (1000 UNIT) tablet [id number] Yes Take 1,000 Units by mouth daily.  [provider] Taking Active   clopidogrel (PLAVIX) 75 MG tablet [id number] Yes Take 75 mg by mouth daily. [provider] Taking Active Self  empagliflozin (JARDIANCE) 25 MG TABS tablet [id number] Yes Take 1 tablet (25 mg total) by mouth daily before breakfast. McLean-Scocuzza, Nino Glow, MD Taking Active   ezetimibe (ZETIA) 10 MG tablet [id number]  Take 1 tablet (10 mg total) by mouth daily. McLean-Scocuzza, Nino Glow, MD  Active   fenofibrate micronized (LOFIBRA) 134 MG capsule [id number]  Take 1 capsule (134 mg total) by mouth daily before breakfast. McLean-Scocuzza, Nino Glow, MD  Active   lisinopril (ZESTRIL) 2.5 MG tablet [id number]  Take 1 tablet (2.5 mg total) by mouth daily. McLean-Scocuzza, Nino Glow, MD  Active   MAGNESIUM PO  [id number] Yes Take by mouth daily. [provider] Taking Active   metFORMIN (GLUCOPHAGE XR) 500 MG 24 hr tablet [id number] Yes Take 2 tablets (1,000 mg total) by mouth in the morning and at bedtime. McLean-Scocuzza, Nino Glow, MD Taking Active   metoprolol tartrate (LOPRESSOR) 50 MG tablet [id number] Yes Take 1 tablet (50 mg total) by mouth 2 (two) times daily. McLean-Scocuzza, Nino Glow, MD Taking Active   mupirocin ointment (BACTROBAN) 2 % [id number] No Apply 1 application topically 2 (two) times daily.  Patient not taking: Reported on 06/09/2021   McLean-Scocuzza, Nino Glow, MD Not Taking Consider Medication Status and Discontinue   Probiotic Product (PROBIOTIC DAILY PO) [id number] Yes Take by mouth. [provider] Taking Active   rosuvastatin (CRESTOR) 40 MG tablet [id number] Yes Take 1 tablet (40 mg total) by mouth at bedtime. McLean-Scocuzza, Nino Glow, MD Taking Active   Semaglutide [facility]) 7 MG TABS [id number] Yes Take 7 mg by mouth daily. [provider] Taking Active   vitamin B-12 (CYANOCOBALAMIN) 500 MCG tablet [id number] Yes Take 5,000 mcg by mouth daily.  [provider] Taking Active Self  Zoster Vaccine Adjuvanted [facility]) injection [id number] Yes Inject 0.5 mLs into the muscle once for 1 dose. X 2 doses McLean-Scocuzza, Nino Glow, MD  Active             Pertinent Labs:   Lab Results  Component Value Date   HGBA1C 7.6 (H) 02/16/2021   Lab Results  Component Value Date   CHOL 103 02/16/2021   HDL 34.60 (L) 02/16/2021  LDLCALC 27 05/14/2020   LDLDIRECT 43.0 02/16/2021   TRIG 312.0 (H) 02/16/2021   CHOLHDL 3 02/16/2021   Lab Results  Component Value Date   CREATININE 1.04 02/16/2021   BUN 17 02/16/2021   NA 138 02/16/2021   K 4.4 02/16/2021   CL 101 02/16/2021   CO2 28 02/16/2021    SDOH:  (Social Determinants of Health) assessments and interventions performed:  SDOH Interventions    Flowsheet Row Most Recent Value  SDOH  Interventions   Financial Strain Interventions Other (Comment)  [patient assistance]       CCM Care Plan  Review of patient past medical history, allergies, medications, health status, including review of consultants reports, laboratory and other test data, was performed as part of comprehensive evaluation and provision of chronic care management services.   Care Plan : Medication Management  Updates made by De Hollingshead, RPH-CPP since 07/18/2021 12:00 AM     Problem: Diabetes, Hyperlipidemia      Long-Range Goal: Disease Progression Prevention   Recent Progress: On track  Priority: High  Note:   Current Barriers:  Unable to independently afford treatment regimen Unable to achieve control of diabetes   Pharmacist Clinical Goal(s):  Over the next 90 days, patient will verbalize ability to afford treatment regimen. Over the next 90 days, patient will  achieve adherence to monitoring guidelines and medication adherence to achieve therapeutic efficacy. Over the next 90 days, patient will maintain control of diabetes and hyperlipidemia as evidenced by labwork through collaboration with PharmD and provider.   Interventions: 1:1 collaboration with McLean-Scocuzza, Nino Glow, MD regarding development and update of comprehensive plan of care as evidenced by provider attestation and co-signature Inter-disciplinary care team collaboration (see longitudinal plan of care) Comprehensive medication review performed; medication list updated in electronic medical record   Acute Needs: Reports acute illness. See phone note. Assisted in scheduling appointment tomorrow, testing today.  Diabetes: Uncontrolled; current treatment: Rybelsus 7 mg daily (max tolerated dose d/t GI upset), metformin XR 1000 mg BID, Jardiance 25 mg daily Approved for patient assistance for Rybelsus, Jardiance through 06/2022 Given acute needs, defer changes. Continue current regimen at this  time  Hypertension: Controlled per last clinic reading; current treatment: lisinopril 2.5 mg QAM, metoprolol tartrate 50 mg BID Previously recommended to continue current regimen. Encourage benefit of home BP monitoring  Hyperlipidemia, ASCVD Secondary Prevention: Controlled; current treatment: ezetimibe 10 mg daily, rosuvastatin 40 mg (just increased), but does not appear this dose has been filled, fenofibrate 134 mg daily; follows with Dr. Saralyn Pilar Previously with [facility]. Will plan to pursue re-enrollment at our next call, given acute needs today Antiplatelet therapy: aspirin 81 mg daily w/ clopidogrel 75 mg daily Previously recommended to continue current regimen at this time  Chronic back/hip pain: Moderately well controlled; current regimen: acetaminophen 1000 mg BID, meloxicam 7.5 mg BID;  Previously discussed benefit of around the clock acetaminophen.   Patient Goals/Self-Care Activities Over the next 90 days, patient will:  - focus on medication adherence by collaborating with team on medication access solutions - check blood glucose twice daily, document, and provide at future appointments      Plan:  follow up telephone call in ~ 6 weeks  [name], PharmD, [place], [place] Pharmacist [employer] at [employer] [phone]

## 2021-07-18 NOTE — Telephone Encounter (Signed)
Orders placed Patient will be by at 2:30p 07/18/21 around back to be tested.

## 2021-07-18 NOTE — Progress Notes (Signed)
Negative strep, covid and influenza A and B.

## 2021-07-18 NOTE — Telephone Encounter (Signed)
Pt has appt with michelle 07/19/21

## 2021-07-19 ENCOUNTER — Encounter: Payer: Self-pay | Admitting: Adult Health

## 2021-07-19 ENCOUNTER — Ambulatory Visit (INDEPENDENT_AMBULATORY_CARE_PROVIDER_SITE_OTHER): Payer: Medicare Other | Admitting: Adult Health

## 2021-07-19 VITALS — Ht 72.09 in | Wt 235.0 lb

## 2021-07-19 DIAGNOSIS — J069 Acute upper respiratory infection, unspecified: Secondary | ICD-10-CM

## 2021-07-19 HISTORY — DX: Acute upper respiratory infection, unspecified: J06.9

## 2021-07-19 NOTE — Patient Instructions (Signed)

## 2021-07-19 NOTE — Progress Notes (Signed)
Virtual Visit via Telephone Note  I connected with Kevin Ryan on 07/19/21 at  8:30 AM EST by telephone and verified that I am speaking with the correct person using two identifiers.  Location: Patient: at home  Provider: Provider: Provider's office at  [facility], [place].      I discussed the limitations, risks, security and privacy concerns of performing an evaluation and management service by telephone and the availability of in person appointments. I also discussed with the patient that there may be a patient responsible charge related to this service. The patient expressed understanding and agreed to proceed.   History of Present Illness: Patient started with symptoms of sore throat , headache and cough. Onset of symptoms 07/17/21 and he was tested in office on 07/18/21 for covid, influenza and strep.  Likely tested too early. Mild body aches.  He feels he is a little better than he was yesterday and sore throat is improved.  Afebrile. Denies any distress breathing.  Denies any cough keeping him up at night.   Denies any known exposure.    Patient  denies any fever, chills, rash, chest pain, shortness of breath, nausea, vomiting, or diarrhea.  Denies dizziness, lightheadedness, pre syncopal or syncopal episodes.   Observations/Objective:    Patient is alert and oriented and responsive to questions Engages in conversation with provider. Speaks in full sentences without any pauses without any shortness of breath or distress.   Assessment and Plan:  Viral upper respiratory tract infection  Discussed Viral infection, likely, he was tested within 24 hours of symptoms onset.  He feels better today, politely declines send off respiratory panel swab in parking lot testing today.  Discussed symptomatic treatment with over the counter medications Mucinex , Delsym, per package instruction. Warm salt water gargles TID.  Discussed signs of  transitioning to bacterial infection and when to call office or be seen.  Follow Up Instructions:  Advised in person evaluation at anytime is advised if any symptoms do not improve, worsen or change at any given time.  Red Flags discussed. The patient was given clear instructions to go to ER or return to medical center if any red flags develop, symptoms do not improve, worsen or new problems develop. They verbalized understanding.    I discussed the assessment and treatment plan with the patient. The patient was provided an opportunity to ask questions and all were answered. The patient agreed with the plan and demonstrated an understanding of the instructions.   The patient was advised to call back or seek an in-person evaluation if the symptoms worsen or if the condition fails to improve as anticipated.  I provided 20 minutes of non-face-to-face time during this encounter.   Marcille Buffy, FNP

## 2021-07-30 DIAGNOSIS — I2510 Atherosclerotic heart disease of native coronary artery without angina pectoris: Secondary | ICD-10-CM | POA: Diagnosis not present

## 2021-07-30 DIAGNOSIS — E1165 Type 2 diabetes mellitus with hyperglycemia: Secondary | ICD-10-CM

## 2021-08-03 ENCOUNTER — Telehealth: Payer: Self-pay | Admitting: Internal Medicine

## 2021-08-03 NOTE — Telephone Encounter (Signed)
Pt need medication refill on rybelsus.He has been getting medication from [facility] and he only has 6 days left and does not what to do. Pt does not if he should come to the office because he has been picking it up here or let pharmacy fill it

## 2021-08-03 NOTE — Telephone Encounter (Signed)
Provided patient the phone number for [employer] to call and follow up on shipping [phone]). If patient has not received order by next week, can contact for samples.

## 2021-08-11 ENCOUNTER — Ambulatory Visit: Payer: Medicare Other

## 2021-08-19 ENCOUNTER — Other Ambulatory Visit: Payer: Medicare Other

## 2021-09-01 ENCOUNTER — Telehealth: Payer: Medicare Other

## 2021-09-06 ENCOUNTER — Encounter: Payer: Self-pay | Admitting: Internal Medicine

## 2021-09-07 ENCOUNTER — Encounter
Admission: RE | Disposition: A | Discharge status: Home / Self Care | Payer: Self-pay | Source: Home / Self Care | Attending: Internal Medicine

## 2021-09-07 ENCOUNTER — Encounter: Payer: Self-pay | Admitting: Internal Medicine

## 2021-09-07 ENCOUNTER — Other Ambulatory Visit: Payer: Self-pay

## 2021-09-07 ENCOUNTER — Ambulatory Visit: Payer: Medicare Other | Admitting: Anesthesiology

## 2021-09-07 ENCOUNTER — Ambulatory Visit
Admission: RE | Admit: 2021-09-07 | Discharge: 2021-09-07 | Disposition: A | Discharge status: Home / Self Care | Payer: Medicare Other | Attending: Internal Medicine | Admitting: Internal Medicine

## 2021-09-07 DIAGNOSIS — Z7984 Long term (current) use of oral hypoglycemic drugs: Secondary | ICD-10-CM | POA: Diagnosis not present

## 2021-09-07 DIAGNOSIS — Z79899 Other long term (current) drug therapy: Secondary | ICD-10-CM | POA: Diagnosis not present

## 2021-09-07 DIAGNOSIS — E119 Type 2 diabetes mellitus without complications: Secondary | ICD-10-CM | POA: Diagnosis not present

## 2021-09-07 DIAGNOSIS — Z86010 Personal history of colonic polyps: Secondary | ICD-10-CM | POA: Diagnosis not present

## 2021-09-07 DIAGNOSIS — Z7902 Long term (current) use of antithrombotics/antiplatelets: Secondary | ICD-10-CM | POA: Insufficient documentation

## 2021-09-07 DIAGNOSIS — J449 Chronic obstructive pulmonary disease, unspecified: Secondary | ICD-10-CM | POA: Diagnosis not present

## 2021-09-07 DIAGNOSIS — E7800 Pure hypercholesterolemia, unspecified: Secondary | ICD-10-CM | POA: Diagnosis not present

## 2021-09-07 DIAGNOSIS — Z955 Presence of coronary angioplasty implant and graft: Secondary | ICD-10-CM | POA: Insufficient documentation

## 2021-09-07 DIAGNOSIS — I252 Old myocardial infarction: Secondary | ICD-10-CM | POA: Diagnosis not present

## 2021-09-07 DIAGNOSIS — F17200 Nicotine dependence, unspecified, uncomplicated: Secondary | ICD-10-CM | POA: Diagnosis not present

## 2021-09-07 DIAGNOSIS — G4730 Sleep apnea, unspecified: Secondary | ICD-10-CM | POA: Diagnosis not present

## 2021-09-07 DIAGNOSIS — I2510 Atherosclerotic heart disease of native coronary artery without angina pectoris: Secondary | ICD-10-CM | POA: Insufficient documentation

## 2021-09-07 DIAGNOSIS — I10 Essential (primary) hypertension: Secondary | ICD-10-CM | POA: Insufficient documentation

## 2021-09-07 DIAGNOSIS — Z7982 Long term (current) use of aspirin: Secondary | ICD-10-CM | POA: Insufficient documentation

## 2021-09-07 DIAGNOSIS — K219 Gastro-esophageal reflux disease without esophagitis: Secondary | ICD-10-CM | POA: Diagnosis not present

## 2021-09-07 DIAGNOSIS — Z1211 Encounter for screening for malignant neoplasm of colon: Secondary | ICD-10-CM | POA: Insufficient documentation

## 2021-09-07 HISTORY — PX: COLONOSCOPY WITH PROPOFOL: SHX5780

## 2021-09-07 HISTORY — DX: Gastro-esophageal reflux disease without esophagitis: K21.9

## 2021-09-07 LAB — HM COLONOSCOPY

## 2021-09-07 LAB — GLUCOSE, CAPILLARY: Glucose-Capillary: 190 mg/dL — ABNORMAL HIGH (ref 70–99)

## 2021-09-07 SURGERY — COLONOSCOPY WITH PROPOFOL
Anesthesia: General

## 2021-09-07 MED ORDER — PROPOFOL 500 MG/50ML IV EMUL
INTRAVENOUS | Status: AC
  Filled 2021-09-07: qty 50

## 2021-09-07 MED ORDER — PROPOFOL 10 MG/ML IV BOLUS
INTRAVENOUS | Status: DC | PRN
Start: 2021-09-07 — End: 2021-09-07
  Administered 2021-09-07: 70 mg via INTRAVENOUS
  Administered 2021-09-07: 50 mg via INTRAVENOUS
  Administered 2021-09-07: 30 mg via INTRAVENOUS

## 2021-09-07 MED ORDER — SODIUM CHLORIDE 0.9 % IV SOLN: INTRAVENOUS | Status: DC

## 2021-09-07 MED ORDER — LIDOCAINE HCL (CARDIAC) PF 100 MG/5ML IV SOSY
PREFILLED_SYRINGE | INTRAVENOUS | Status: DC | PRN
  Administered 2021-09-07: 80 mg via INTRAVENOUS

## 2021-09-07 MED ORDER — PHENYLEPHRINE HCL (PRESSORS) 10 MG/ML IV SOLN
INTRAVENOUS | Status: DC | PRN
  Administered 2021-09-07: 150 ug via INTRAVENOUS

## 2021-09-07 MED ORDER — PROPOFOL 10 MG/ML IV BOLUS
INTRAVENOUS | Status: AC
  Filled 2021-09-07: qty 80

## 2021-09-07 MED ORDER — PROPOFOL 500 MG/50ML IV EMUL
INTRAVENOUS | Status: DC | PRN
  Administered 2021-09-07: 100 ug/kg/min via INTRAVENOUS

## 2021-09-07 NOTE — H&P (Signed)
Outpatient short stay form Pre-procedure 09/07/2021 9:31 AM Kevin Ryan, M.D.  Primary Physician: [name], M.D.  Reason for visit:  History of tubular adenoma of the colon (Aug 2016)  History of present illness:                            Patient presents for colonoscopy for a personal hx of colon polyps. The patient denies abdominal pain, abnormal weight loss or rectal bleeding.     No current facility-administered medications for this encounter.  Medications Prior to Admission  Medication Sig Dispense Refill Last Dose   Dapagliflozin-metFORMIN HCl ER (XIGDUO XR) 04-999 MG TB24 Take by mouth.      acetaminophen (TYLENOL) 500 MG tablet Take 1,000 mg by mouth in the morning and at bedtime.      aspirin 81 MG tablet Take 81 mg by mouth daily.      cholecalciferol (VITAMIN D) 25 MCG (1000 UNIT) tablet Take 1,000 Units by mouth daily.       clopidogrel (PLAVIX) 75 MG tablet Take 75 mg by mouth daily.      empagliflozin (JARDIANCE) 25 MG TABS tablet Take 1 tablet (25 mg total) by mouth daily before breakfast. 90 tablet 3    ezetimibe (ZETIA) 10 MG tablet Take 1 tablet (10 mg total) by mouth daily. 90 tablet 3    fenofibrate micronized (LOFIBRA) 134 MG capsule Take 1 capsule (134 mg total) by mouth daily before breakfast. 90 capsule 3    lisinopril (ZESTRIL) 2.5 MG tablet Take 1 tablet (2.5 mg total) by mouth daily. 90 tablet 3    MAGNESIUM PO Take by mouth daily.      metFORMIN (GLUCOPHAGE XR) 500 MG 24 hr tablet Take 2 tablets (1,000 mg total) by mouth in the morning and at bedtime. 360 tablet 3    metoprolol tartrate (LOPRESSOR) 50 MG tablet Take 1 tablet (50 mg total) by mouth 2 (two) times daily. 180 tablet 3    Probiotic Product (PROBIOTIC DAILY PO) Take by mouth.      rosuvastatin (CRESTOR) 40 MG tablet Take 1 tablet (40 mg total) by mouth at bedtime. 90 tablet 3    Semaglutide (RYBELSUS) 7 MG TABS Take 7 mg by mouth daily.      vitamin B-12 (CYANOCOBALAMIN) 500 MCG  tablet Take 5,000 mcg by mouth daily.         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    Physical Exam  Gen: Alert, oriented. Appears stated age.  HEENT: [place]/AT. PERRLA. Lungs: CTA, no wheezes. CV: RR nl S1, S2. Abd: soft, benign, no masses. BS+ Ext: No edema. Pulses 2+    Planned procedures: Proceed with colonoscopy. The patient understands the nature of the planned procedure, indications, risks, alternatives and potential complications including but not limited to bleeding, infection, perforation, damage to internal organs and possible oversedation/side effects from anesthesia. The patient agrees and gives consent to proceed.  Please refer to procedure notes for findings, recommendations and patient disposition/instructions.     Leiland Mihelich K. Alice Ryan, M.D. Gastroenterology 09/07/2021  9:31 AM

## 2021-09-07 NOTE — Transfer of Care (Signed)
Immediate Anesthesia Transfer of Care Note  Patient: Kevin Ryan  Procedure(s) Performed: COLONOSCOPY WITH PROPOFOL  Patient Location: PACU and Endoscopy Unit  Anesthesia Type:General  Level of Consciousness: awake, drowsy and patient cooperative  Airway & Oxygen Therapy: Patient Spontanous Breathing  Post-op Assessment: Report given to RN and Post -op Vital signs reviewed and stable  Post vital signs: Reviewed and stable  Last Vitals:  Vitals Value Taken Time  BP 115/51 09/07/21 1137  Temp    Pulse 92 09/07/21 1138  Resp 25 09/07/21 1138  SpO2 98 % 09/07/21 1138  Vitals shown include unvalidated device data.  Last Pain:  Vitals:   09/07/21 1045  TempSrc: Temporal  PainSc: 0-No pain         Complications: No notable events documented.

## 2021-09-07 NOTE — Interval H&P Note (Signed)
History and Physical Interval Note:  09/07/2021 9:32 AM  Kevin Ryan  has presented today for surgery, with the diagnosis of HX Adenomatous Polyps.  The various methods of treatment have been discussed with the patient and family. After consideration of risks, benefits and other options for treatment, the patient has consented to  Procedure(s) with comments: COLONOSCOPY WITH PROPOFOL (N/A) - DM as a surgical intervention.  The patient's history has been reviewed, patient examined, no change in status, stable for surgery.  I have reviewed the patient's chart and labs.  Questions were answered to the patient's satisfaction.     Olivia Lopez de Gutierrez, [place]

## 2021-09-07 NOTE — Anesthesia Preprocedure Evaluation (Signed)
Anesthesia Evaluation  Patient identified by MRN, date of birth, ID band Patient awake    Reviewed: Allergy & Precautions, NPO status , Patient's Chart, lab work & pertinent test results  History of Anesthesia Complications Negative for: history of anesthetic complications  Airway Mallampati: III  TM Distance: <3 FB Neck ROM: full    Dental  (+) Chipped, Poor Dentition, Missing, Upper Dentures   Pulmonary sleep apnea and Continuous Positive Airway Pressure Ventilation , COPD, Current Smoker and Patient abstained from smoking.,    Pulmonary exam normal        Cardiovascular Exercise Tolerance: Good hypertension, (-) angina+ CAD and + Past MI  Normal cardiovascular exam     Neuro/Psych  Neuromuscular disease negative psych ROS   GI/Hepatic Neg liver ROS, GERD  Controlled,  Endo/Other  diabetes, Type 2  Renal/GU negative Renal ROS  negative genitourinary   Musculoskeletal  (+) Arthritis ,   Abdominal   Peds  Hematology negative hematology ROS (+)   Anesthesia Other Findings Past Medical History: No date: Abnormal CT of the abdomen No date: Anemia     Comment:  decreased platelets in last blood work No date: Arthritis     Comment:  spine, L1-5 No date: CAD (coronary artery disease)     Comment:  s/p MI No date: CAD (coronary artery disease)     Comment:  s/p stent x 1 2004  No date: Cancer [facility])     Comment:  spindle cell stomach  No date: Diabetes mellitus without complication (HCC)     Comment:  oral meds, Type 2 04/12/2015: Gastric mass     Comment:  cancer; resected No date: GERD (gastroesophageal reflux disease) No date: History of kidney stones No date: Hypercholesterolemia No date: Hypertension 2004: Myocardial infarction ([place]) 07/13/2015: Personal history of tobacco use, presenting hazards to  health No date: Pneumonia No date: Sleep apnea     Comment:  CPAP No date: Stenosis of hepatic artery  (HCC) No date: Vertigo     Comment:  none in approx 1 yr No date: Wears dentures     Comment:  partial upper No date: Wears hearing aid in both ears  Past Surgical History: No date: BACK SURGERY     Comment:  01/29/18 [facility] lower  back  2004: CARDIAC CATHETERIZATION     Comment:  1 stent No date: CARPAL TUNNEL RELEASE; Bilateral 06/04/2019: CATARACT EXTRACTION W/PHACO; Left     Comment:  Procedure: CATARACT EXTRACTION PHACO AND INTRAOCULAR               LENS PLACEMENT (IOC) LEFT DIABETIC 01:08.3  18.6%  12.76;              Surgeon: Leandrew Koyanagi, MD;  Location: [place];  Service: Ophthalmology;  Laterality: Left;              diabetic - oral meds sleep apnea 07/02/2019: CATARACT EXTRACTION W/PHACO; Right     Comment:  Procedure: CATARACT EXTRACTION PHACO AND INTRAOCULAR               LENS PLACEMENT ([place]) Right DIABETIC;  Surgeon:               Leandrew Koyanagi, MD;  Location: [place];              Service: Ophthalmology;  Laterality: Right;  diabetic-oral med sleep apnea (CPAP) 03/26/2015: COLONOSCOPY WITH PROPOFOL; N/A     Comment:  Procedure: COLONOSCOPY WITH PROPOFOL;  Surgeon: Josefine Class, MD;  Location: [facility] ENDOSCOPY;  Service:               Endoscopy;  Laterality: N/A; No date: KNEE SURGERY     Comment:  TKR No date: KNEE SURGERY     Comment:  b/l partial knee Dr. Leanna Sato  No date: PARTIAL GASTRECTOMY     Comment:  2016 [facility]  BMI    Body Mass Index: 30.34 kg/m      Reproductive/Obstetrics negative OB ROS                             Anesthesia Physical Anesthesia Plan  ASA: 3  Anesthesia Plan: General   Post-op Pain Management:    Induction: Intravenous  PONV Risk Score and Plan: Propofol infusion and TIVA  Airway Management Planned: Natural Airway and Nasal Cannula  Additional Equipment:   Intra-op Plan:   Post-operative Plan:   Informed  Consent: I have reviewed the patients History and Physical, chart, labs and discussed the procedure including the risks, benefits and alternatives for the proposed anesthesia with the patient or authorized representative who has indicated his/her understanding and acceptance.     Dental Advisory Given  Plan Discussed with: Anesthesiologist, CRNA and Surgeon  Anesthesia Plan Comments: (Patient consented for risks of anesthesia including but not limited to:  - adverse reactions to medications - risk of airway placement if required - damage to eyes, teeth, lips or other oral mucosa - nerve damage due to positioning  - sore throat or hoarseness - Damage to heart, brain, nerves, lungs, other parts of body or loss of life  Patient voiced understanding.)        Anesthesia Quick Evaluation

## 2021-09-07 NOTE — Anesthesia Postprocedure Evaluation (Signed)
Anesthesia Post Note  Patient: Kevin Ryan  Procedure(s) Performed: COLONOSCOPY WITH PROPOFOL  Patient location during evaluation: Endoscopy Anesthesia Type: General Level of consciousness: awake and alert Pain management: pain level controlled Vital Signs Assessment: post-procedure vital signs reviewed and stable Respiratory status: spontaneous breathing, nonlabored ventilation, respiratory function stable and patient connected to nasal cannula oxygen Cardiovascular status: blood pressure returned to baseline and stable Postop Assessment: no apparent nausea or vomiting Anesthetic complications: no   No notable events documented.   Last Vitals:  Vitals:   09/07/21 1137 09/07/21 1157  BP: (!) 115/51 137/70  Pulse:    Resp:    Temp: (!) 36.1 C   SpO2:      Last Pain:  Vitals:   09/07/21 1206  TempSrc:   PainSc: 0-No pain                 Precious Haws Mulki Roesler

## 2021-09-07 NOTE — Op Note (Signed)
[facility] Gastroenterology Patient Name: Kevin Ryan Procedure Date: 09/07/2021 11:17 AM MRN: [id number] Account #: [id number] Date of Birth: 03-21-1951 Admit Type: Outpatient Age: 71 Room: [facility] ENDO ROOM 2 Gender: Male Note Status: Finalized Instrument Name: Park Meo [id number] Procedure:             Colonoscopy Indications:           Surveillance: Personal history of adenomatous polyps                         on last colonoscopy > 5 years ago Providers:             Lorie Apley K. Thierno Hun MD, MD Medicines:             Propofol per Anesthesia Complications:         No immediate complications. Procedure:             Pre-Anesthesia Assessment:                        - The risks and benefits of the procedure and the                         sedation options and risks were discussed with the                         patient. All questions were answered and informed                         consent was obtained.                        - Patient identification and proposed procedure were                         verified prior to the procedure by the nurse. The                         procedure was verified in the procedure room.                        - ASA Grade Assessment: III - A patient with severe                         systemic disease.                        - After reviewing the risks and benefits, the patient                         was deemed in satisfactory condition to undergo the                         procedure.                        After obtaining informed consent, the colonoscope was                         passed under direct vision. Throughout the procedure,  the patient's blood pressure, pulse, and oxygen                         saturations were monitored continuously. The                         Colonoscope was introduced through the anus and                         advanced to the the cecum, identified by appendiceal                          orifice and ileocecal valve. The colonoscopy was                         somewhat difficult due to significant looping.                         Successful completion of the procedure was aided by                         applying abdominal pressure. The patient tolerated the                         procedure well. The quality of the bowel preparation                         was adequate. The ileocecal valve, appendiceal                         orifice, and rectum were photographed. Findings:      The perianal and digital rectal examinations were normal. Pertinent       negatives include normal sphincter tone and no palpable rectal lesions.      The entire examined colon appeared normal on direct and retroflexion       views. Impression:            - The entire examined colon is normal on direct and                         retroflexion views.                        - No specimens collected. Recommendation:        - Patient has a contact number available for                         emergencies. The signs and symptoms of potential                         delayed complications were discussed with the patient.                         Return to normal activities tomorrow. Written                         discharge instructions were provided to the patient.                        -  Resume previous diet.                        - Continue present medications.                        - No repeat colonoscopy due to current age (26 years                         or older) and the absence of colonic polyps.                        - You do NOT require further colon cancer screening                         measures (Annual stool testing (i.e. hemoccult, FIT,                         cologuard), sigmoidoscopy, colonoscopy or CT                         colonography). You should share this recommendation                         with your Primary Care provider.                        - Return to GI  office PRN.                        - The findings and recommendations were discussed with                         the patient. Procedure Code(s):     --- Professional ---                        O1607, Colorectal cancer screening; colonoscopy on                         individual at high risk Diagnosis Code(s):     --- Professional ---                        Z86.010, Personal history of colonic polyps CPT copyright 2019 American Medical Association. All rights reserved. The codes documented in this report are preliminary and upon coder review may  be revised to meet current compliance requirements. Efrain Sella MD, MD 09/07/2021 11:36:34 AM This report has been signed electronically. Number of Addenda: 0 Note Initiated On: 09/07/2021 11:17 AM Scope Withdrawal Time: 0 hours 6 minutes 50 seconds  Total Procedure Duration: 0 hours 11 minutes 44 seconds  Estimated Blood Loss:  Estimated blood loss: none.      [facility]

## 2021-09-08 ENCOUNTER — Encounter: Payer: Self-pay | Admitting: Internal Medicine

## 2021-09-10 ENCOUNTER — Other Ambulatory Visit: Payer: Self-pay | Admitting: Internal Medicine

## 2021-09-10 DIAGNOSIS — E1165 Type 2 diabetes mellitus with hyperglycemia: Secondary | ICD-10-CM

## 2021-09-20 ENCOUNTER — Telehealth: Payer: Self-pay | Admitting: Internal Medicine

## 2021-09-20 NOTE — Telephone Encounter (Signed)
Patient assistance came in for Patient's Rybelsus. Labelled each box (4) and placed upfront for the Patient to pick up.   Patient informed and verbalized understanding.

## 2021-09-23 NOTE — Telephone Encounter (Signed)
Signing encounter see previous note on 10/06/20 °

## 2021-09-29 ENCOUNTER — Telehealth: Payer: Medicare Other

## 2021-10-03 ENCOUNTER — Ambulatory Visit: Payer: Self-pay | Admitting: Pharmacist

## 2021-10-03 ENCOUNTER — Telehealth: Payer: Self-pay | Admitting: Pharmacist

## 2021-10-03 NOTE — Telephone Encounter (Signed)
Inform pt can sch orders in since 05/2021 fasting labs if can sch before visit with me if it has been > 3 months and I think it has thanks ? ?

## 2021-10-03 NOTE — Telephone Encounter (Signed)
No am lab appointments available before 3/10. Would you like to push Patient appointment with you back and I call to schedule fasting labs at least 3 days away  ?

## 2021-10-03 NOTE — Chronic Care Management (AMB) (Signed)
?  Chronic Care Management  ? ?Note ? ?10/03/2021 ?Name: Kevin Ryan MRN: [id number] DOB: 09/28/1950 ? ? ? ?Closing pharmacy CCM case at this time. Closing pharmacy CCM case at this time. Will collaborate with Care Guide to outreach to schedule follow up with RN CM. Patient has clinic contact information for future questions or concerns.  ? ?[name], PharmD, [place], CPP ?Clinical Pharmacist ?Therapist, music at [employer] ?[phone] ? ?

## 2021-10-03 NOTE — Telephone Encounter (Signed)
Noted that patient has PCP appointment 3/10 - do you want him to come in for fasting labs before? ?

## 2021-10-04 NOTE — Telephone Encounter (Signed)
Yes ok to sch labs and move my appt back please  ? ?

## 2021-10-07 ENCOUNTER — Encounter: Payer: Self-pay | Admitting: Internal Medicine

## 2021-10-07 ENCOUNTER — Other Ambulatory Visit: Payer: Self-pay

## 2021-10-07 ENCOUNTER — Ambulatory Visit (INDEPENDENT_AMBULATORY_CARE_PROVIDER_SITE_OTHER): Payer: Medicare Other | Admitting: Internal Medicine

## 2021-10-07 VITALS — BP 124/70 | HR 83 | Temp 98.4°F | Resp 18 | Ht 72.0 in | Wt 238.4 lb

## 2021-10-07 DIAGNOSIS — E785 Hyperlipidemia, unspecified: Secondary | ICD-10-CM | POA: Diagnosis not present

## 2021-10-07 DIAGNOSIS — E119 Type 2 diabetes mellitus without complications: Secondary | ICD-10-CM

## 2021-10-07 DIAGNOSIS — Z23 Encounter for immunization: Secondary | ICD-10-CM

## 2021-10-07 DIAGNOSIS — E1165 Type 2 diabetes mellitus with hyperglycemia: Secondary | ICD-10-CM

## 2021-10-07 MED ORDER — TETANUS-DIPHTH-ACELL PERTUSSIS 5-2.5-18.5 LF-MCG/0.5 IM SUSP: 0.5000 mL | Freq: Once | INTRAMUSCULAR | 0 refills | Status: AC

## 2021-10-07 MED ORDER — SHINGRIX 50 MCG/0.5ML IM SUSR: 0.5000 mL | Freq: Once | INTRAMUSCULAR | 1 refills | Status: AC

## 2021-10-07 MED ORDER — FENOFIBRATE 134 MG PO CAPS: 134.0000 mg | ORAL_CAPSULE | Freq: Every day | ORAL | 3 refills | Status: DC

## 2021-10-07 MED ORDER — METFORMIN HCL ER 500 MG PO TB24: ORAL_TABLET | ORAL | 3 refills | Status: DC

## 2021-10-07 MED ORDER — ROSUVASTATIN CALCIUM 40 MG PO TABS: 40.0000 mg | ORAL_TABLET | Freq: Every day | ORAL | 3 refills | Status: DC

## 2021-10-07 NOTE — Patient Instructions (Addendum)
Rybelsus  [employer] Patient Assistance Program has an auto-refill program where you do not need to call and request a refill each time. You should receive your medication shipment before you run out of your medication. However, if you have any issues or need assistance, you can call them at [phone]. They are available Monday though Friday 8:00 AM - 8:00 PM.    Take (2)-3 mg rybelsus if you run out in the future    Cetaphil or cerave cream or utter cream Moisture after showers while skin is damp    Tdap (Tetanus, Diphtheria, Pertussis) Vaccine: What You Need to Know 1. Why get vaccinated? Tdap vaccine can prevent tetanus, diphtheria, and pertussis. Diphtheria and pertussis spread from person to person. Tetanus enters the body through cuts or wounds. TETANUS (T) causes painful stiffening of the muscles. Tetanus can lead to serious health problems, including being unable to open the mouth, having trouble swallowing and breathing, or death. DIPHTHERIA (D) can lead to difficulty breathing, heart failure, paralysis, or death. PERTUSSIS (aP), also known as "whooping cough," can cause uncontrollable, violent coughing that makes it hard to breathe, eat, or drink. Pertussis can be extremely serious especially in babies and young children, causing pneumonia, convulsions, brain damage, or death. In teens and adults, it can cause weight loss, loss of bladder control, passing out, and rib fractures from severe coughing. 2. Tdap vaccine Tdap is only for children 7 years and older, adolescents, and adults.  Adolescents should receive a single dose of Tdap, preferably at age 41 or 89 years. Pregnant people should get a dose of Tdap during every pregnancy, preferably during the early part of the third trimester, to help protect the newborn from pertussis. Infants are most at risk for severe, life-threatening complications from pertussis. Adults who have never received Tdap should get a dose of  Tdap. Also, adults should receive a booster dose of either Tdap or Td (a different vaccine that protects against tetanus and diphtheria but not pertussis) every 10 years, or after 5 years in the case of a severe or dirty wound or burn. Tdap may be given at the same time as other vaccines. 3. Talk with your health care provider Tell your vaccine provider if the person getting the vaccine: Has had an allergic reaction after a previous dose of any vaccine that protects against tetanus, diphtheria, or pertussis, or has any severe, life-threatening allergies Has had a coma, decreased level of consciousness, or prolonged seizures within 7 days after a previous dose of any pertussis vaccine (DTP, DTaP, or Tdap) Has seizures or another nervous system problem Has ever had Guillain-Barr Syndrome (also called "GBS") Has had severe pain or swelling after a previous dose of any vaccine that protects against tetanus or diphtheria In some cases, your health care provider may decide to postpone Tdap vaccination until a future visit. People with minor illnesses, such as a cold, may be vaccinated. People who are moderately or severely ill should usually wait until they recover before getting Tdap vaccine.  Your health care provider can give you more information. 4. Risks of a vaccine reaction Pain, redness, or swelling where the shot was given, mild fever, headache, feeling tired, and nausea, vomiting, diarrhea, or stomachache sometimes happen after Tdap vaccination. People sometimes faint after medical procedures, including vaccination. Tell your provider if you feel dizzy or have vision changes or ringing in the ears.  As with any medicine, there is a very remote chance of a vaccine causing a  severe allergic reaction, other serious injury, or death. 5. What if there is a serious problem? An allergic reaction could occur after the vaccinated person leaves the clinic. If you see signs of a severe allergic reaction  (hives, swelling of the face and throat, difficulty breathing, a fast heartbeat, dizziness, or weakness), call 9-1-1 and get the person to the nearest hospital. For other signs that concern you, call your health care provider.  Adverse reactions should be reported to the Vaccine Adverse Event Reporting System (VAERS). Your health care provider will usually file this report, or you can do it yourself. Visit the VAERS website at www.vaers.SamedayNews.es or call 325-071-9713. VAERS is only for reporting reactions, and VAERS staff members do not give medical advice. 6. The National Vaccine Injury Compensation Program The [employer] Vaccine Injury Compensation Program (VICP) is a federal program that was created to compensate people who may have been injured by certain vaccines. Claims regarding alleged injury or death due to vaccination have a time limit for filing, which may be as short as two years. Visit the VICP website at GoldCloset.com.ee or call 346-290-7600 to learn about the program and about filing a claim. 7. How can I learn more? Ask your health care provider. Call your local or state health department. Visit the website of the Food and Drug Administration (FDA) for vaccine package inserts and additional information at TraderRating.uy. Contact the Centers for Disease Control and Prevention (CDC): Call 3528855399 (1-800-CDC-INFO) or Visit CDC's website at http://hunter.com/. Vaccine Information Statement Tdap (Tetanus, Diphtheria, Pertussis) Vaccine (03/05/2020) This information is not intended to replace advice given to you by your health care provider. Make sure you discuss any questions you have with your health care provider. Document Revised: 03/31/2020 Document Reviewed: 03/31/2020 Elsevier Patient Education  [place].  Zoster Vaccine, Recombinant injection What is this medication? ZOSTER VACCINE (ZOS ter vak SEEN) is a vaccine  used to reduce the risk of getting shingles. This vaccine is not used to treat shingles or nerve pain from shingles. This medicine may be used for other purposes; ask your health care provider or pharmacist if you have questions. COMMON BRAND NAME(S): [facility] What should I tell my care team before I take this medication? They need to know if you have any of these conditions: cancer immune system problems an unusual or allergic reaction to Zoster vaccine, other medications, foods, dyes, or preservatives pregnant or trying to get pregnant breast-feeding How should I use this medication? This vaccine is injected into a muscle. It is given by a health care provider. A copy of Vaccine Information Statements will be given before each vaccination. Be sure to read this information carefully each time. This sheet may change often. Talk to your health care provider about the use of this vaccine in children. This vaccine is not approved for use in children. Overdosage: If you think you have taken too much of this medicine contact a poison control center or emergency room at once. NOTE: This medicine is only for you. Do not share this medicine with others. What if I miss a dose? Keep appointments for follow-up (booster) doses. It is important not to miss your dose. Call your health care provider if you are unable to keep an appointment. What may interact with this medication? medicines that suppress your immune system medicines to treat cancer steroid medicines like prednisone or cortisone This list may not describe all possible interactions. Give your health care provider a list of all the medicines, herbs, non-prescription  drugs, or dietary supplements you use. Also tell them if you smoke, drink alcohol, or use illegal drugs. Some items may interact with your medicine. What should I watch for while using this medication? Visit your health care provider regularly. This vaccine, like all vaccines, may  not fully protect everyone. What side effects may I notice from receiving this medication? Side effects that you should report to your doctor or health care professional as soon as possible: allergic reactions (skin rash, itching or hives; swelling of the face, lips, or tongue) trouble breathing Side effects that usually do not require medical attention (report these to your doctor or health care professional if they continue or are bothersome): chills headache fever nausea pain, redness, or irritation at site where injected tiredness vomiting This list may not describe all possible side effects. Call your doctor for medical advice about side effects. You may report side effects to FDA at 1-800-FDA-1088. Where should I keep my medication? This vaccine is only given by a health care provider. It will not be stored at home. NOTE: This sheet is a summary. It may not cover all possible information. If you have questions about this medicine, talk to your doctor, pharmacist, or health care provider.  2022 Elsevier/Gold Standard (2021-04-05 00:00:00)

## 2021-10-07 NOTE — Progress Notes (Signed)
No chief complaint on file.  F/u diabetes  1. Dm 2 has been out rybelsus 7 mg qd and metformin 500 mg xr qd or jardiance 25 mg qd or xigduo xr 04-999 m, zetia 10 mg qd lopressor 50 mg bid, crestor 40 mg qhs  Htn on lis 2.5 mg qd for kidney protection BP controlled    Review of Systems  Constitutional:  Negative for weight loss.  HENT:  Negative for hearing loss.   Eyes:  Negative for blurred vision.  Respiratory:  Negative for shortness of breath.   Cardiovascular:  Negative for chest pain.  Gastrointestinal:  Negative for abdominal pain and blood in stool.  Musculoskeletal:  Negative for back pain.  Skin:  Negative for rash.  Neurological:  Negative for headaches.  Psychiatric/Behavioral:  Negative for depression.   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    Diabetes mellitus without complication ([place])    oral meds, Type 2   Gastric mass 04/12/2015   cancer; resected   GERD (gastroesophageal reflux disease)    History of kidney stones    Hypercholesterolemia    Hypertension    Myocardial infarction [facility]) 2004   Personal history of tobacco use, presenting hazards to health 07/13/2015   Pneumonia    Sleep apnea    CPAP   Stenosis of hepatic artery (HCC)    Vertigo    none in approx 1 yr   Wears dentures    partial upper   Wears hearing aid in both ears    Past Surgical History:  Procedure Laterality Date   BACK SURGERY     01/29/18 [facility] lower  back    CARDIAC CATHETERIZATION  2004   1 stent   CARPAL TUNNEL RELEASE Bilateral    CATARACT EXTRACTION W/PHACO Left 06/04/2019   Procedure: CATARACT EXTRACTION PHACO AND INTRAOCULAR LENS PLACEMENT ([place]) LEFT DIABETIC 01:08.3  18.6%  12.76;  Surgeon: Leandrew Koyanagi, MD;   Location: [facility];  Service: Ophthalmology;  Laterality: Left;  diabetic - oral meds sleep apnea   CATARACT EXTRACTION W/PHACO Right 07/02/2019   Procedure: CATARACT EXTRACTION PHACO AND INTRAOCULAR LENS PLACEMENT ([place]) Right DIABETIC;  Surgeon: Leandrew Koyanagi, MD;  Location: [facility];  Service: Ophthalmology;  Laterality: Right;  diabetic-oral med sleep apnea (CPAP)   COLONOSCOPY WITH PROPOFOL N/A 03/26/2015   Procedure: COLONOSCOPY WITH PROPOFOL;  Surgeon: Josefine Class, MD;  Location: [facility] ENDOSCOPY;  Service: Endoscopy;  Laterality: N/A;   COLONOSCOPY WITH PROPOFOL N/A 09/07/2021   Procedure: COLONOSCOPY WITH PROPOFOL;  Surgeon: Toledo, Benay Pike, MD;  Location: [facility] ENDOSCOPY;  Service: Gastroenterology;  Laterality: N/A;  DM   KNEE SURGERY     TKR   KNEE SURGERY     b/l partial knee Dr. Leanna Sato    PARTIAL GASTRECTOMY     2016 [facility]   Family History  Problem Relation Age of Onset   Diabetes Mellitus II Maternal Grandfather    Heart disease Maternal Grandfather    CAD Paternal Grandfather    Cancer Paternal Grandfather        ? type   Arthritis Mother  Depression Mother    Hearing loss Mother    Diabetes Father    Cancer Brother        throat smoker    Hearing loss Maternal Grandmother    Arthritis Maternal Grandmother    Social History   Socioeconomic History   Marital status: Married    Spouse name: Not on file   Number of children: Not on file   Years of education: Not on file   Highest education level: Not on file  Occupational History   Not on file  Tobacco Use   Smoking status: Some Days    Packs/day: 0.50    Years: 50.00    Pack years: 25.00    Types: Cigarettes   Smokeless tobacco: Former    Quit date: 06/16/1969   Tobacco comments:    1 ppd or a little less (since age 60)  Vaping Use   Vaping Use: Former  Substance and Sexual Activity   Alcohol use: No    Comment: may have drink 3-4x/yr   Drug use: No   Sexual  activity: Yes    Birth control/protection: None  Other Topics Concern   Not on file  Social History Narrative   Owns guns    Wears seat belt    Safe in relationship    Married 2 sons    -married 68 years as of 09/2019    On disability    Assoc degree and prev supervisor    Retired as of 09/2019 end of month       Social Determinants of Health   Financial Resource Strain: Medium Risk   Difficulty of Paying Living Expenses: Somewhat hard  Food Insecurity: Not on file  Transportation Needs: Not on file  Physical Activity: Not on file  Stress: Not on file  Social Connections: Not on file  Intimate Partner Violence: Not on file   Current Meds  Medication Sig   acetaminophen (TYLENOL) 500 MG tablet Take 1,000 mg by mouth in the morning and at bedtime.   aspirin 81 MG tablet Take 81 mg by mouth daily.   cholecalciferol (VITAMIN D) 25 MCG (1000 UNIT) tablet Take 1,000 Units by mouth daily.    clopidogrel (PLAVIX) 75 MG tablet Take 75 mg by mouth daily.   Dapagliflozin-metFORMIN HCl ER (XIGDUO XR) 04-999 MG TB24 Take by mouth.   empagliflozin (JARDIANCE) 25 MG TABS tablet Take 1 tablet (25 mg total) by mouth daily before breakfast.   ezetimibe (ZETIA) 10 MG tablet Take 1 tablet (10 mg total) by mouth daily.   fenofibrate micronized (LOFIBRA) 134 MG capsule Take 1 capsule (134 mg total) by mouth daily before breakfast.   lisinopril (ZESTRIL) 2.5 MG tablet Take 1 tablet (2.5 mg total) by mouth daily.   MAGNESIUM PO Take by mouth daily.   metFORMIN (GLUCOPHAGE-XR) 500 MG 24 hr tablet TAKE TWO TABLETS BY MOUTH IN THE MORNINGAND AT BEDTIME   metoprolol tartrate (LOPRESSOR) 50 MG tablet Take 1 tablet (50 mg total) by mouth 2 (two) times daily.   Probiotic Product (PROBIOTIC DAILY PO) Take by mouth.   rosuvastatin (CRESTOR) 40 MG tablet Take 1 tablet (40 mg total) by mouth at bedtime.   Semaglutide (RYBELSUS) 7 MG TABS Take 7 mg by mouth daily.   Tdap (BOOSTRIX) 5-2.5-18.5 LF-MCG/0.5  injection Inject 0.5 mLs into the muscle once for 1 dose.   vitamin B-12 (CYANOCOBALAMIN) 500 MCG tablet Take 5,000 mcg by mouth daily.    Zoster Vaccine Adjuvanted [facility]) injection  Inject 0.5 mLs into the muscle once for 1 dose.   Allergies  Allergen Reactions   Ibuprofen Itching and Rash   Recent Results (from the past 2160 hour(s))  POCT Influenza A/B     Status: Normal   Collection Time: 07/18/21  3:01 PM  Result Value Ref Range   Influenza A, POC Negative Negative   Influenza B, POC Negative Negative  POCT rapid strep A     Status: Normal   Collection Time: 07/18/21  3:02 PM  Result Value Ref Range   Rapid Strep A Screen Negative Negative  POC COVID-19     Status: Normal   Collection Time: 07/18/21  3:02 PM  Result Value Ref Range   SARS Coronavirus 2 Ag Negative Negative  Glucose, capillary     Status: Abnormal   Collection Time: 09/07/21 11:11 AM  Result Value Ref Range   Glucose-Capillary 190 (H) 70 - 99 mg/dL    Comment: Glucose reference range applies only to samples taken after fasting for at least 8 hours.   Objective  Body mass index is 32.33 kg/m. Wt Readings from Last 3 Encounters:  10/07/21 238 lb 6 oz (108.1 kg)  09/07/21 230 lb (104.3 kg)  07/19/21 235 lb (106.6 kg)   Temp Readings from Last 3 Encounters:  10/07/21 98.4 F (36.9 C) (Oral)  09/07/21 (!) 97 F (36.1 C) (Temporal)  06/09/21 (!) 97 F (36.1 C) (Temporal)   BP Readings from Last 3 Encounters:  10/07/21 124/70  09/07/21 130/71  06/09/21 114/66   Pulse Readings from Last 3 Encounters:  10/07/21 83  09/07/21 81  06/09/21 76    Physical Exam Vitals and nursing note reviewed.  Constitutional:      Appearance: Normal appearance. He is well-developed and well-groomed.  HENT:     Head: Normocephalic and atraumatic.  Eyes:     Conjunctiva/sclera: Conjunctivae normal.     Pupils: Pupils are equal, round, and reactive to light.  Cardiovascular:     Rate and Rhythm: Normal rate  and regular rhythm.     Heart sounds: Normal heart sounds.  Pulmonary:     Effort: Pulmonary effort is normal. No respiratory distress.     Breath sounds: Normal breath sounds.  Abdominal:     Tenderness: There is no abdominal tenderness.  Skin:    General: Skin is warm and moist.  Neurological:     General: No focal deficit present.     Mental Status: He is alert and oriented to person, place, and time. Mental status is at baseline.     Sensory: Sensation is intact.     Motor: Motor function is intact.     Coordination: Coordination is intact.     Gait: Gait is intact. Gait normal.  Psychiatric:        Attention and Perception: Attention and perception normal.        Mood and Affect: Mood and affect normal.        Speech: Speech normal.        Behavior: Behavior normal. Behavior is cooperative.        Thought Content: Thought content normal.        Cognition and Memory: Cognition and memory normal.        Judgment: Judgment normal.    Assessment  Plan  Need for Tdap vaccination - Plan: Tdap (BOOSTRIX) 5-2.5-18.5 LF-MCG/0.5 injection  Need for shingles vaccine - Plan: Zoster Vaccine Adjuvanted [facility]) injection  jardiance 4m qd zetia 10, lis  2.5 mg metformin xr 500 mg, crestor 40 mg lopressor 50 mg bid rybelsus 7 mg  2. Hld trigs >300 rec goal <150 on zetia 10, crestor 40 mg qd vasepa was too $, fenofibrate he has fish oill will try this   HM Flu shot utd covid declines 2021  Tdap and shingles Rx given today prevnar utd  pna 23 utd   rec hepA/hep B pt declines h/o fatty liver MMR immune   H/o colon polyps 03/26/15 polys x 3-4 tubular and hyperplastic repeat in 3-5 years [facility] Dr. Rayann Heman  Due 03/25/20 [facility] -referred [facility] as of 12/07/20  06/14/21 colonscopy Crowly, Cornelia Copa  08/2021 [facility] Dr. Alice Reichert   PSA normal 0.41 11/11/20 B12 01/23/18 474    rec healthy diet and exercise    H/o lung nodules consider ct chest in future  -smoker age 21 to present max 1.5 ppd now  <1 ppd no FH lung cancer rec cessation  -CT chest 10/24/19   IMPRESSION: 1. Lung-RADS 2, benign appearance or behavior. Continue annual screening with low-dose chest CT without contrast in 12 months. 2. Hepatic steatosis. 3.  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     Provider: Dr. [name]-Internal Medicine

## 2021-10-10 NOTE — Telephone Encounter (Signed)
Patient has lab appointment scheduled for 10/14/21 ?

## 2021-10-14 ENCOUNTER — Other Ambulatory Visit (INDEPENDENT_AMBULATORY_CARE_PROVIDER_SITE_OTHER): Payer: Medicare Other

## 2021-10-14 ENCOUNTER — Other Ambulatory Visit: Payer: Self-pay

## 2021-10-14 DIAGNOSIS — E1159 Type 2 diabetes mellitus with other circulatory complications: Secondary | ICD-10-CM | POA: Diagnosis not present

## 2021-10-14 DIAGNOSIS — I152 Hypertension secondary to endocrine disorders: Secondary | ICD-10-CM

## 2021-10-14 LAB — COMPREHENSIVE METABOLIC PANEL
ALT: 19 U/L (ref 0–53)
AST: 20 U/L (ref 0–37)
Albumin: 4.5 g/dL (ref 3.5–5.2)
Alkaline Phosphatase: 64 U/L (ref 39–117)
BUN: 19 mg/dL (ref 6–23)
CO2: 27 mEq/L (ref 19–32)
Calcium: 9.7 mg/dL (ref 8.4–10.5)
Chloride: 99 mEq/L (ref 96–112)
Creatinine, Ser: 1.04 mg/dL (ref 0.40–1.50)
GFR: 72.4 mL/min (ref >60.00)
Glucose, Bld: 170 mg/dL — ABNORMAL HIGH (ref 70–99)
Potassium: 4.3 mEq/L (ref 3.5–5.1)
Sodium: 135 mEq/L (ref 135–145)
Total Bilirubin: 0.5 mg/dL (ref 0.2–1.2)
Total Protein: 7.5 g/dL (ref 6.0–8.3)

## 2021-10-14 LAB — CBC WITH DIFFERENTIAL/PLATELET
Basophils Absolute: 0.1 10*3/uL (ref 0.0–0.1)
Basophils Relative: 1.1 % (ref 0.0–3.0)
Eosinophils Absolute: 0.3 10*3/uL (ref 0.0–0.7)
Eosinophils Relative: 4 % (ref 0.0–5.0)
HCT: 46.6 % (ref 39.0–52.0)
Hemoglobin: 15.6 g/dL (ref 13.0–17.0)
Lymphocytes Relative: 30.3 % (ref 12.0–46.0)
Lymphs Abs: 2.3 10*3/uL (ref 0.7–4.0)
MCHC: 33.5 g/dL (ref 30.0–36.0)
MCV: 97.3 fl (ref 78.0–100.0)
Monocytes Absolute: 0.5 10*3/uL (ref 0.1–1.0)
Monocytes Relative: 6.8 % (ref 3.0–12.0)
Neutro Abs: 4.4 10*3/uL (ref 1.4–7.7)
Neutrophils Relative %: 57.8 % (ref 43.0–77.0)
Platelets: 132 10*3/uL — ABNORMAL LOW (ref 150.0–400.0)
RBC: 4.79 Mil/uL (ref 4.22–5.81)
RDW: 13.9 % (ref 11.5–15.5)
WBC: 7.6 10*3/uL (ref 4.0–10.5)

## 2021-10-14 LAB — LDL CHOLESTEROL, DIRECT: Direct LDL: 38 mg/dL

## 2021-10-14 LAB — TSH: TSH: 3.46 u[IU]/mL (ref 0.35–5.50)

## 2021-10-14 LAB — LIPID PANEL
Cholesterol: 102 mg/dL (ref 0–200)
HDL: 34.2 mg/dL — ABNORMAL LOW (ref >39.00)
Total CHOL/HDL Ratio: 3
Triglycerides: 453 mg/dL — ABNORMAL HIGH (ref 0.0–149.0)

## 2021-10-14 LAB — HEMOGLOBIN A1C: Hgb A1c MFr Bld: 8.3 % — ABNORMAL HIGH (ref 4.6–6.5)

## 2021-11-14 ENCOUNTER — Telehealth: Payer: Self-pay

## 2021-11-14 ENCOUNTER — Telehealth: Payer: Self-pay | Admitting: Internal Medicine

## 2021-11-14 NOTE — Telephone Encounter (Signed)
Pt called back and stated that he does not want the phone call appointment ?

## 2021-11-14 NOTE — Telephone Encounter (Signed)
Copied from [place] [phone]. Topic: Medicare AWV ?>> Nov 14, 2021 10:42 AM Harris-Coley, Hannah Beat wrote: ?Reason for CRM: Left message for patient to schedule Annual Wellness Visit.  Please schedule with Nurse Health Advisor Denisa O'Brien-Blaney, LPN at [facility].  Please call [phone] ask for Juliann Pulse ?

## 2021-11-14 NOTE — Telephone Encounter (Signed)
Informed pt that Patient Assistance med Rybelsus has been received here in office and is able to come and P/U M-F 8-5. Pt verbalized understanding.  ? ?Meds are at my desk. ?

## 2021-11-21 ENCOUNTER — Encounter: Payer: Self-pay | Admitting: Internal Medicine

## 2021-12-15 ENCOUNTER — Other Ambulatory Visit: Payer: Self-pay | Admitting: Internal Medicine

## 2021-12-15 DIAGNOSIS — I10 Essential (primary) hypertension: Secondary | ICD-10-CM

## 2022-01-09 ENCOUNTER — Telehealth: Payer: Self-pay

## 2022-01-09 NOTE — Telephone Encounter (Signed)
Lvm to inform pt that his patient assistance med Semaglutide (RYBELSUS) 7 MG TABS has been received at the ofc and he is able to come P/U med M-F 8-5 at his earliest convenience   Medicine placed upfront.

## 2022-03-21 ENCOUNTER — Ambulatory Visit (INDEPENDENT_AMBULATORY_CARE_PROVIDER_SITE_OTHER): Payer: Medicare Other | Admitting: Internal Medicine

## 2022-03-21 ENCOUNTER — Encounter: Payer: Self-pay | Admitting: Internal Medicine

## 2022-03-21 VITALS — BP 128/64 | HR 67 | Temp 97.4°F | Ht 72.0 in | Wt 234.4 lb

## 2022-03-21 DIAGNOSIS — E1159 Type 2 diabetes mellitus with other circulatory complications: Secondary | ICD-10-CM

## 2022-03-21 DIAGNOSIS — M25552 Pain in left hip: Secondary | ICD-10-CM

## 2022-03-21 DIAGNOSIS — Z125 Encounter for screening for malignant neoplasm of prostate: Secondary | ICD-10-CM | POA: Diagnosis not present

## 2022-03-21 DIAGNOSIS — N529 Male erectile dysfunction, unspecified: Secondary | ICD-10-CM

## 2022-03-21 DIAGNOSIS — M25551 Pain in right hip: Secondary | ICD-10-CM

## 2022-03-21 DIAGNOSIS — I152 Hypertension secondary to endocrine disorders: Secondary | ICD-10-CM | POA: Diagnosis not present

## 2022-03-21 DIAGNOSIS — E785 Hyperlipidemia, unspecified: Secondary | ICD-10-CM

## 2022-03-21 DIAGNOSIS — E119 Type 2 diabetes mellitus without complications: Secondary | ICD-10-CM

## 2022-03-21 DIAGNOSIS — E1165 Type 2 diabetes mellitus with hyperglycemia: Secondary | ICD-10-CM

## 2022-03-21 DIAGNOSIS — Z23 Encounter for immunization: Secondary | ICD-10-CM

## 2022-03-21 DIAGNOSIS — N400 Enlarged prostate without lower urinary tract symptoms: Secondary | ICD-10-CM

## 2022-03-21 DIAGNOSIS — I10 Essential (primary) hypertension: Secondary | ICD-10-CM

## 2022-03-21 DIAGNOSIS — G8929 Other chronic pain: Secondary | ICD-10-CM

## 2022-03-21 DIAGNOSIS — I2510 Atherosclerotic heart disease of native coronary artery without angina pectoris: Secondary | ICD-10-CM

## 2022-03-21 DIAGNOSIS — T148XXA Other injury of unspecified body region, initial encounter: Secondary | ICD-10-CM

## 2022-03-21 LAB — COMPREHENSIVE METABOLIC PANEL
ALT: 22 U/L (ref 0–53)
AST: 24 U/L (ref 0–37)
Albumin: 4.8 g/dL (ref 3.5–5.2)
Alkaline Phosphatase: 65 U/L (ref 39–117)
BUN: 24 mg/dL — ABNORMAL HIGH (ref 6–23)
CO2: 25 mEq/L (ref 19–32)
Calcium: 9.8 mg/dL (ref 8.4–10.5)
Chloride: 98 mEq/L (ref 96–112)
Creatinine, Ser: 1.04 mg/dL (ref 0.40–1.50)
GFR: 72.18 mL/min (ref >60.00)
Glucose, Bld: 119 mg/dL — ABNORMAL HIGH (ref 70–99)
Potassium: 4.7 mEq/L (ref 3.5–5.1)
Sodium: 134 mEq/L — ABNORMAL LOW (ref 135–145)
Total Bilirubin: 0.7 mg/dL (ref 0.2–1.2)
Total Protein: 7.4 g/dL (ref 6.0–8.3)

## 2022-03-21 LAB — CBC WITH DIFFERENTIAL/PLATELET
Basophils Absolute: 0 10*3/uL (ref 0.0–0.1)
Basophils Relative: 0.5 % (ref 0.0–3.0)
Eosinophils Absolute: 0.3 10*3/uL (ref 0.0–0.7)
Eosinophils Relative: 3.6 % (ref 0.0–5.0)
HCT: 45.8 % (ref 39.0–52.0)
Hemoglobin: 15.2 g/dL (ref 13.0–17.0)
Lymphocytes Relative: 25.7 % (ref 12.0–46.0)
Lymphs Abs: 2.3 10*3/uL (ref 0.7–4.0)
MCHC: 33.1 g/dL (ref 30.0–36.0)
MCV: 97.5 fl (ref 78.0–100.0)
Monocytes Absolute: 0.5 10*3/uL (ref 0.1–1.0)
Monocytes Relative: 5.6 % (ref 3.0–12.0)
Neutro Abs: 5.9 10*3/uL (ref 1.4–7.7)
Neutrophils Relative %: 64.6 % (ref 43.0–77.0)
Platelets: 151 10*3/uL (ref 150.0–400.0)
RBC: 4.7 Mil/uL (ref 4.22–5.81)
RDW: 14.2 % (ref 11.5–15.5)
WBC: 9.1 10*3/uL (ref 4.0–10.5)

## 2022-03-21 LAB — HEMOGLOBIN A1C: Hgb A1c MFr Bld: 7.8 % — ABNORMAL HIGH (ref 4.6–6.5)

## 2022-03-21 LAB — LIPID PANEL
Cholesterol: 103 mg/dL (ref 0–200)
HDL: 31.8 mg/dL — ABNORMAL LOW (ref >39.00)
NonHDL: 71.31
Total CHOL/HDL Ratio: 3
Triglycerides: 389 mg/dL — ABNORMAL HIGH (ref 0.0–149.0)
VLDL: 77.8 mg/dL — ABNORMAL HIGH (ref 0.0–40.0)

## 2022-03-21 LAB — LDL CHOLESTEROL, DIRECT: Direct LDL: 38 mg/dL

## 2022-03-21 LAB — PSA: PSA: 0.28 ng/mL (ref 0.10–4.00)

## 2022-03-21 MED ORDER — SHINGRIX 50 MCG/0.5ML IM SUSR: 0.5000 mL | Freq: Once | INTRAMUSCULAR | 1 refills | Status: AC

## 2022-03-21 MED ORDER — MUPIROCIN 2 % EX OINT: 1.0000 | TOPICAL_OINTMENT | Freq: Two times a day (BID) | CUTANEOUS | 0 refills | Status: AC

## 2022-03-21 NOTE — Patient Instructions (Addendum)
Dr. Malissa Hippo new PCP   07/12/2022 Appointment Radiology Angelina Ok, [place] Grice, MD   [facility] [address]   [place], [place] [address]   [phone] (Work)   [phone] (Fax)      07/12/2022 Office Visit Oncology Barnetta Chapel, MD   [facility] [address] [place], [place] [address]   [phone] (Work)   [phone] (Fax)      Lidocaine pain patch  Tramadol is stronger than Tylenol

## 2022-03-21 NOTE — Progress Notes (Signed)
Chief Complaint  Patient presents with   Follow-up    5 month f/u    F/u  1. DM 2 and HLD on Jardiance 25 mg qd rybelsus 7 mg qd, zetia 10, lisinoprol 2.5, lofibra 134 has tried vasecpa in the past but was costly, metformin xr 500 ( 2 am and pm), lopressor 50 mg bid, crestor 40 mg qhs  2. Chronic b/l hip pain 3. Open wound   Review of Systems  Constitutional:  Negative for weight loss.  HENT:  Negative for hearing loss.   Eyes:  Negative for blurred vision.  Respiratory:  Negative for shortness of breath.   Cardiovascular:  Negative for chest pain.  Gastrointestinal:  Negative for abdominal pain and blood in stool.  Musculoskeletal:  Negative for back pain.  Skin:  Negative for rash.  Neurological:  Negative for headaches.  Psychiatric/Behavioral:  Negative for depression.    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    Diabetes mellitus without complication ([place])    oral meds, Type 2   Gastric mass 04/12/2015   cancer; resected   GERD (gastroesophageal reflux disease)    History of kidney stones    Hypercholesterolemia    Hypertension    Myocardial infarction [facility]) 2004   Personal history of tobacco use, presenting hazards to health 07/13/2015   Pneumonia    Sleep apnea    CPAP   Stenosis of hepatic artery (HCC)    Vertigo    none in approx 1 yr   Wears dentures    partial upper   Wears hearing aid in both ears    Past Surgical History:  Procedure Laterality Date   BACK SURGERY     01/29/18 [facility] lower  back    CARDIAC CATHETERIZATION  2004   1 stent   CARPAL TUNNEL RELEASE Bilateral    CATARACT EXTRACTION W/PHACO Left 06/04/2019   Procedure: CATARACT EXTRACTION PHACO AND INTRAOCULAR LENS PLACEMENT ([place]) LEFT DIABETIC  01:08.3  18.6%  12.76;  Surgeon: Leandrew Koyanagi, MD;  Location: [place];  Service: Ophthalmology;  Laterality: Left;  diabetic - oral meds sleep apnea   CATARACT EXTRACTION W/PHACO Right 07/02/2019   Procedure: CATARACT EXTRACTION PHACO AND INTRAOCULAR LENS PLACEMENT ([place]) Right DIABETIC;  Surgeon: Leandrew Koyanagi, MD;  Location: [place];  Service: Ophthalmology;  Laterality: Right;  diabetic-oral med sleep apnea (CPAP)   COLONOSCOPY WITH PROPOFOL N/A 03/26/2015   Procedure: COLONOSCOPY WITH PROPOFOL;  Surgeon: Josefine Class, MD;  [facility] ENDOSCOPY;  Service: Endoscopy;  Laterality: N/A;   COLONOSCOPY WITH PROPOFOL N/A 09/07/2021   Procedure: COLONOSCOPY WITH PROPOFOL;  Surgeon: Toledo, Benay Pike, MD;  Location: [facility] ENDOSCOPY;  Service: Gastroenterology;  Laterality: N/A;  DM   KNEE SURGERY     TKR   KNEE SURGERY     b/l partial knee Dr. Leanna Sato    PARTIAL GASTRECTOMY     2016 [facility]   Family History  Problem Relation Age of Onset   Diabetes Mellitus II Maternal Grandfather    Heart disease Maternal Grandfather    CAD Paternal Grandfather    Cancer Paternal Grandfather        ?  type   Arthritis Mother    Depression Mother    Hearing loss Mother    Diabetes Father    Cancer Brother        throat smoker    Hearing loss Maternal Grandmother    Arthritis Maternal Grandmother    Social History   Socioeconomic History   Marital status: Married    Spouse name: Not on file   Number of children: Not on file   Years of education: Not on file   Highest education level: Not on file  Occupational History   Not on file  Tobacco Use   Smoking status: Some Days    Packs/day: 0.50    Years: 50.00    Total pack years: 25.00    Types: Cigarettes   Smokeless tobacco: Former    Quit date: 06/16/1969   Tobacco comments:    1 ppd or a little less (since age 56)  Vaping Use   Vaping Use: Former  Substance and Sexual Activity   Alcohol use:  No    Comment: may have drink 3-4x/yr   Drug use: No   Sexual activity: Yes    Birth control/protection: None  Other Topics Concern   Not on file  Social History Narrative   Owns guns    Wears seat belt    Safe in relationship    Married 2 sons    -married 20 years as of 09/2019    On disability    Assoc degree and prev supervisor    Retired as of 09/2019 end of month       Social Determinants of Health   Financial Resource Strain: Medium Risk (07/18/2021)   Overall Financial Resource Strain (CARDIA)    Difficulty of Paying Living Expenses: Somewhat hard  Food Insecurity: No Food Insecurity (08/10/2020)   Hunger Vital Sign    Worried About Running Out of Food in the Last Year: Never true    [place] in the Last Year: Never true  Transportation Needs: No Transportation Needs (08/10/2020)   PRAPARE - Hydrologist (Medical): No    Lack of Transportation (Non-Medical): No  Physical Activity: Not on file  Stress: No Stress Concern Present (08/10/2020)   [place]    Feeling of Stress : Not at all  Social Connections: Unknown (08/10/2020)   Social Connection and Isolation Panel [NHANES]    Frequency of Communication with Friends and Family: Not on file    Frequency of Social Gatherings with Friends and Family: Not on file    Attends Religious Services: Not on file    Active Member of Clubs or Organizations: Not on file    Attends Archivist Meetings: Not on file    Marital Status: Married  Intimate Partner Violence: Not At Risk (08/10/2020)   Humiliation, Afraid, Rape, and Kick questionnaire    Fear of Current or Ex-Partner: No    Emotionally Abused: No    Physically Abused: No    Sexually Abused: No   Current Meds  Medication Sig   acetaminophen (TYLENOL) 500 MG tablet Take 1,000 mg by mouth in the morning and at bedtime.   aspirin 81 MG tablet Take 81 mg by mouth  daily.   cholecalciferol (VITAMIN D) 25 MCG (1000 UNIT) tablet Take 1,000 Units by mouth daily.    clopidogrel (PLAVIX) 75 MG tablet Take 75 mg by mouth daily.   empagliflozin (JARDIANCE)  25 MG TABS tablet Take 1 tablet (25 mg total) by mouth daily before breakfast.   ezetimibe (ZETIA) 10 MG tablet Take 1 tablet (10 mg total) by mouth daily.   fenofibrate micronized (LOFIBRA) 134 MG capsule Take 1 capsule (134 mg total) by mouth daily before breakfast.   lisinopril (ZESTRIL) 2.5 MG tablet Take 1 tablet (2.5 mg total) by mouth daily.   MAGNESIUM PO Take by mouth daily.   metFORMIN (GLUCOPHAGE-XR) 500 MG 24 hr tablet TAKE TWO TABLETS BY MOUTH IN THE MORNINGAND AT BEDTIME   metoprolol tartrate (LOPRESSOR) 50 MG tablet TAKE ONE TABLET BY MOUTH TWICE DAILY   mupirocin ointment (BACTROBAN) 2 % Apply 1 Application topically 2 (two) times daily. Prn wounds   Probiotic Product (PROBIOTIC DAILY PO) Take by mouth.   rosuvastatin (CRESTOR) 40 MG tablet Take 1 tablet (40 mg total) by mouth at bedtime.   Semaglutide (RYBELSUS) 7 MG TABS Take 7 mg by mouth daily.   vitamin B-12 (CYANOCOBALAMIN) 500 MCG tablet Take 5,000 mcg by mouth daily.    [EXPIRED] Zoster Vaccine Adjuvanted [facility]) injection Inject 0.5 mLs into the muscle once for 1 dose. 2nd dose 2 to less than 6 months   Allergies  Allergen Reactions   Ibuprofen Itching and Rash   Recent Results (from the past 2160 hour(s))  Comprehensive metabolic panel     Status: Abnormal   Collection Time: 03/21/22 11:00 AM  Result Value Ref Range   Sodium 134 (L) 135 - 145 mEq/L   Potassium 4.7 3.5 - 5.1 mEq/L   Chloride 98 96 - 112 mEq/L   CO2 25 19 - 32 mEq/L   Glucose, Bld 119 (H) 70 - 99 mg/dL   BUN 24 (H) 6 - 23 mg/dL   Creatinine, Ser 1.04 0.40 - 1.50 mg/dL   Total Bilirubin 0.7 0.2 - 1.2 mg/dL   Alkaline Phosphatase 65 39 - 117 U/L   AST 24 0 - 37 U/L   ALT 22 0 - 53 U/L   Total Protein 7.4 6.0 - 8.3 g/dL   Albumin 4.8 3.5 - 5.2 g/dL   GFR  72.18 >60.00 mL/min    Comment: Calculated using the CKD-EPI Creatinine Equation (2021)   Calcium 9.8 8.4 - 10.5 mg/dL  Lipid panel     Status: Abnormal   Collection Time: 03/21/22 11:00 AM  Result Value Ref Range   Cholesterol 103 0 - 200 mg/dL    Comment: ATP III Classification       Desirable:  < 200 mg/dL               Borderline High:  200 - 239 mg/dL          High:  > = 240 mg/dL   Triglycerides 389.0 (H) 0.0 - 149.0 mg/dL    Comment: Normal:  <150 mg/dLBorderline High:  150 - 199 mg/dL   HDL 31.80 (L) >39.00 mg/dL   VLDL 77.8 (H) 0.0 - 40.0 mg/dL   Total CHOL/HDL Ratio 3     Comment:                Men          Women1/2 Average Risk     3.4          3.3Average Risk          5.0          4.42X Average Risk          9.6  7.13X Average Risk          15.0          11.0                       NonHDL 71.31     Comment: NOTE:  Non-HDL goal should be 30 mg/dL higher than patient's LDL goal (i.e. LDL goal of < 70 mg/dL, would have non-HDL goal of < 100 mg/dL)  Hemoglobin A1c     Status: Abnormal   Collection Time: 03/21/22 11:00 AM  Result Value Ref Range   Hgb A1c MFr Bld 7.8 (H) 4.6 - 6.5 %    Comment: Glycemic Control Guidelines for People with Diabetes:Non Diabetic:  <6%Goal of Therapy: <7%Additional Action Suggested:  >8%   CBC w/Diff     Status: None   Collection Time: 03/21/22 11:00 AM  Result Value Ref Range   WBC 9.1 4.0 - 10.5 K/uL   RBC 4.70 4.22 - 5.81 Mil/uL   Hemoglobin 15.2 13.0 - 17.0 g/dL   HCT 45.8 39.0 - 52.0 %   MCV 97.5 78.0 - 100.0 fl   MCHC 33.1 30.0 - 36.0 g/dL   RDW 14.2 11.5 - 15.5 %   Platelets 151.0 150.0 - 400.0 K/uL   Neutrophils Relative % 64.6 43.0 - 77.0 %   Lymphocytes Relative 25.7 12.0 - 46.0 %   Monocytes Relative 5.6 3.0 - 12.0 %   Eosinophils Relative 3.6 0.0 - 5.0 %   Basophils Relative 0.5 0.0 - 3.0 %   Neutro Abs 5.9 1.4 - 7.7 K/uL   Lymphs Abs 2.3 0.7 - 4.0 K/uL   Monocytes Absolute 0.5 0.1 - 1.0 K/uL   Eosinophils Absolute 0.3  0.0 - 0.7 K/uL   Basophils Absolute 0.0 0.0 - 0.1 K/uL  PSA     Status: None   Collection Time: 03/21/22 11:00 AM  Result Value Ref Range   PSA 0.28 0.10 - 4.00 ng/mL    Comment: Test performed using Access Hybritech PSA Assay, a parmagnetic partical, chemiluminecent immunoassay.  LDL cholesterol, direct     Status: None   Collection Time: 03/21/22 11:00 AM  Result Value Ref Range   Direct LDL 38.0 mg/dL    Comment: Optimal:  <100 mg/dLNear or Above Optimal:  100-129 mg/dLBorderline High:  130-159 mg/dLHigh:  160-189 mg/dLVery High:  >190 mg/dL   Objective  Body mass index is 31.79 kg/m. Wt Readings from Last 3 Encounters:  03/21/22 234 lb 6.4 oz (106.3 kg)  10/07/21 238 lb 6 oz (108.1 kg)  09/07/21 230 lb (104.3 kg)   Temp Readings from Last 3 Encounters:  03/21/22 (!) 97.4 F (36.3 C) (Oral)  10/07/21 98.4 F (36.9 C) (Oral)  09/07/21 (!) 97 F (36.1 C) (Temporal)   BP Readings from Last 3 Encounters:  03/21/22 128/64  10/07/21 124/70  09/07/21 130/71   Pulse Readings from Last 3 Encounters:  03/21/22 67  10/07/21 83  09/07/21 81    Physical Exam Vitals and nursing note reviewed.  Constitutional:      Appearance: Normal appearance. He is well-developed and well-groomed.  HENT:     Head: Normocephalic and atraumatic.  Eyes:     Conjunctiva/sclera: Conjunctivae normal.     Pupils: Pupils are equal, round, and reactive to light.  Cardiovascular:     Rate and Rhythm: Normal rate and regular rhythm.     Heart sounds: Normal heart sounds.  Pulmonary:     Effort: Pulmonary  effort is normal. No respiratory distress.     Breath sounds: Normal breath sounds.  Abdominal:     Tenderness: There is no abdominal tenderness.  Skin:    General: Skin is warm and moist.  Neurological:     General: No focal deficit present.     Mental Status: He is alert and oriented to person, place, and time. Mental status is at baseline.     Sensory: Sensation is intact.     Motor:  Motor function is intact.     Coordination: Coordination is intact.     Gait: Gait is intact. Gait normal.     Comments: Has b/l leg braces   Psychiatric:        Attention and Perception: Attention and perception normal.        Mood and Affect: Mood and affect normal.        Speech: Speech normal.        Behavior: Behavior normal. Behavior is cooperative.        Thought Content: Thought content normal.        Cognition and Memory: Cognition and memory normal.        Judgment: Judgment normal.     Assessment  Plan  Hypertension associated with diabetes ([place]) - Plan: Comprehensive metabolic panel, Lipid panel, Hemoglobin A1c, CBC w/Diff ardiance 25 mg qd rybelsus 7 mg qd, zetia 10, lisinoprol 2.5, lofibra 134 has tried vasecpa in the past but was costly, metformin xr 500 ( 2 am and pm), lopressor 50 mg bid, crestor 40 mg qhs  Will need eye exam in the future   Chronic hip pain, bilateral F/u Dr. Radford Pax  Consider tramadol 50 mg qd to bid in the future pt to let me know for pain contract  Open wound - Plan: mupirocin ointment (BACTROBAN) 2 % left >right    HM Flu shot utd consider in the future we did not have high dose flu shot in stock covid declines 2021  Tdap utd 09/2021  Prev given shingles Rx in past  prevnar utd 05/23/16 pna 23 utd 06/17/2019  Prevnar 20 due 06/16/24    rec hepA/hep B pt declines h/o fatty liver MMR immune   H/o colon polyps 03/26/15 polys x 3-4 tubular and hyperplastic repeat in 3-5 years [facility] Dr. Rayann Heman  Due 03/25/20 [facility] -referred [facility] as of 12/07/20  06/14/21 colonscopy Crowly, Cornelia Copa  09/07/2021 normal no repeat   PSA normal 0.41 11/11/20 B12 01/23/18 474    rec healthy diet and exercise    H/o lung nodules consider ct chest in future  -smoker age 65 to present max 1.5 ppd now <1 ppd no FH lung cancer rec cessation  -CT chest 10/24/19   IMPRESSION: 1. Lung-RADS 2, benign appearance or behavior. Continue annual screening with low-dose chest  CT without contrast in 12 months. 2. Hepatic steatosis. 3.  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  Due for f/u 07/12/22 Dr. Angelina Ok [facility]   Provider: Dr. [name]-Internal Medicine

## 2022-03-27 ENCOUNTER — Telehealth: Payer: Self-pay

## 2022-03-27 ENCOUNTER — Encounter: Payer: Self-pay | Admitting: Internal Medicine

## 2022-03-27 NOTE — Telephone Encounter (Signed)
Called pt again (1:15 pm) was able to go over labs with pt and he verbalized understanding.

## 2022-03-27 NOTE — Telephone Encounter (Signed)
LMOM for pt to CB in regards to labs:   McLean-Scocuzza, Nino Glow, MD  Gracy Racer, CMA BUN 1 of kidney tests slightly elevated  -rec drink water 55-64 ounces daily avoid nsaids  Triglycerides still elevated rec healthy diet and exercise  A1c 7.8 improved keep up the good work goal A1c <7.0  PSA normal prostate cancer screenign  Blood counts normal

## 2022-04-11 MED ORDER — CLOPIDOGREL BISULFATE 75 MG PO TABS: 75.0000 mg | ORAL_TABLET | Freq: Every day | ORAL | 3 refills | Status: DC

## 2022-04-11 MED ORDER — LISINOPRIL 2.5 MG PO TABS: 2.5000 mg | ORAL_TABLET | Freq: Every day | ORAL | 3 refills | Status: DC

## 2022-04-11 MED ORDER — FENOFIBRATE 134 MG PO CAPS: 134.0000 mg | ORAL_CAPSULE | Freq: Every day | ORAL | 3 refills | Status: DC

## 2022-04-11 MED ORDER — ROSUVASTATIN CALCIUM 40 MG PO TABS: 40.0000 mg | ORAL_TABLET | Freq: Every day | ORAL | 3 refills | Status: DC

## 2022-04-11 MED ORDER — EZETIMIBE 10 MG PO TABS: 10.0000 mg | ORAL_TABLET | Freq: Every day | ORAL | 3 refills | Status: DC

## 2022-04-11 MED ORDER — EMPAGLIFLOZIN 25 MG PO TABS: 25.0000 mg | ORAL_TABLET | Freq: Every day | ORAL | 3 refills | Status: DC

## 2022-04-11 MED ORDER — METOPROLOL TARTRATE 50 MG PO TABS: 50.0000 mg | ORAL_TABLET | Freq: Two times a day (BID) | ORAL | 3 refills | Status: DC

## 2022-04-11 MED ORDER — RYBELSUS 7 MG PO TABS: 7.0000 mg | ORAL_TABLET | Freq: Every day | ORAL | Status: DC

## 2022-04-11 MED ORDER — METFORMIN HCL ER 500 MG PO TB24: ORAL_TABLET | ORAL | 3 refills | Status: DC

## 2022-04-13 LAB — HM DIABETES EYE EXAM

## 2022-04-14 ENCOUNTER — Encounter: Payer: Self-pay | Admitting: Internal Medicine

## 2022-04-18 ENCOUNTER — Telehealth: Payer: Self-pay | Admitting: Internal Medicine

## 2022-04-18 NOTE — Telephone Encounter (Signed)
Pt called wanting to know if he need a prescription for viagra and pt want to know about his jardiance medication

## 2022-04-24 ENCOUNTER — Telehealth: Payer: Self-pay

## 2022-04-24 NOTE — Telephone Encounter (Signed)
LMTCB

## 2022-04-24 NOTE — Telephone Encounter (Signed)
LMTCB regarding Jardiance was last prescribed on 04/11/22 and yes Viagra needs a prescription.

## 2022-04-26 DIAGNOSIS — N529 Male erectile dysfunction, unspecified: Secondary | ICD-10-CM

## 2022-04-26 HISTORY — DX: Male erectile dysfunction, unspecified: N52.9

## 2022-04-26 MED ORDER — SILDENAFIL CITRATE 20 MG PO TABS: 20.0000 mg | ORAL_TABLET | Freq: Every day | ORAL | 0 refills | Status: DC

## 2022-04-26 NOTE — Telephone Encounter (Signed)
Sent generic viagra which is cheaper to take 1-4 pills as needed daily 4 hours before intercourse

## 2022-04-26 NOTE — Addendum Note (Signed)
Addended by: [name] on: 04/26/2022 12:06 PM   Modules accepted: Orders

## 2022-04-27 ENCOUNTER — Other Ambulatory Visit: Payer: Self-pay | Admitting: Internal Medicine

## 2022-04-27 DIAGNOSIS — Z23 Encounter for immunization: Secondary | ICD-10-CM

## 2022-04-27 MED ORDER — SHINGRIX 50 MCG/0.5ML IM SUSR: 0.5000 mL | Freq: Once | INTRAMUSCULAR | 1 refills | Status: AC

## 2022-04-27 NOTE — Telephone Encounter (Signed)
Sent new Rx for 2 shingles shots 2nd shot rec he get 2 to <6 months of 1st

## 2022-04-27 NOTE — Telephone Encounter (Signed)
Pt has been informed and wanted to know did you have to send in another px for his shingles shot he stated that a year ago you gave total care the px for tetanus and shingles he stated he got the tetanus but not the shingles and wants to go get the shingles now will they honor the last one or does he need a new one?

## 2022-04-28 ENCOUNTER — Telehealth: Payer: Self-pay

## 2022-04-28 NOTE — Telephone Encounter (Signed)
LMOM for pt to CB.  

## 2022-04-28 NOTE — Telephone Encounter (Signed)
Pt has been notified.

## 2022-04-28 NOTE — Telephone Encounter (Signed)
LMOM for pt to CB in regards to an update from DR. Olivia Mackie about shingles Rx:   McLean-Scocuzza, Nino Glow, MD  You 20 hours ago (4:32 PM)   TM Sent new Rx for 2 shingles shots 2nd shot rec he get 2 to <6 months of 1st        Note     You  McLean-Scocuzza, Nino Glow, MD 21 hours ago (3:43 PM)    Pt has been informed and wanted to know did you have to send in another px for his shingles shot he stated that a year ago you gave total care the px for tetanus and shingles he stated he got the tetanus but not the shingles and wants to go get the shingles now will they honor the last one or does he need a new one?

## 2022-04-28 NOTE — Telephone Encounter (Signed)
Patient called back and note was read.

## 2022-05-01 NOTE — Telephone Encounter (Signed)
Pt's wife has been informed.  

## 2022-05-02 ENCOUNTER — Telehealth: Payer: Self-pay | Admitting: Internal Medicine

## 2022-05-02 NOTE — Telephone Encounter (Signed)
Pt came into office to drop a patient assistance form to fill out. Placed up front in provider folder

## 2022-05-03 NOTE — Telephone Encounter (Signed)
My portion filled out pt has to fill out his portion before we can scan and fax off and give original to pt

## 2022-05-04 NOTE — Telephone Encounter (Signed)
Pt has been notified and form has been placed upfront for his pick up and he was told to bring it back  for [place] to send off.

## 2022-06-02 ENCOUNTER — Telehealth: Payer: Self-pay

## 2022-06-02 NOTE — Progress Notes (Signed)
[place] [facility])  [place] Team    06/02/2022  VIRGLE ARTH 07/12/1951 [id number]  Reason for referral: Medication Assistance with Jardiance and Rybelsus  Referral source:  [place] Technician Current insurance: Unknown  Outreach:  Unsuccessful telephone call with Mr. Helsley. Unable to verify HIPAA identifiers.   Subjective:  Called patient to screen for 2024 PAP Re-enrollment for Jardiance and Rybelsus.    Plan: Will follow-up in 1-3 business days.  Thanks,  Reed Breech, [place] [phone]

## 2022-06-09 ENCOUNTER — Telehealth: Payer: Self-pay

## 2022-06-09 NOTE — Progress Notes (Signed)
  [place] [facility]) Care Management  [name]   06/09/2022  Kevin Ryan [birth year] [id number]   2024 Medication Assistance Renewal Application Summary:  Patient was outreached by [place] Team regarding medication assistance renewal for 2024. Verified address, anticipated insurance for 2024, and income has not changed. Patient remains interested in PAP for 2024 for Rybelsus (patient already re-enrolled in Jardiance PAP.   Plan: I will route patient assistance letter to [place] technician who will coordinate patient assistance program application process for medications listed above.  [facility] pharmacy technician will assist with obtaining all required documents from both patient and provider(s) and submit application(s) once completed.    Thank you for allowing pharmacy to be a part of this patient's care.  Reed Breech, PharmD Clinical Pharmacist  [place] [phone]

## 2022-06-09 NOTE — Progress Notes (Signed)
[place] [facility]) [place]   06/09/2022  Kevin Ryan 05-Jan-1951 [id number]   Reason for referral: Medication Assistance  Referral source:  [place] Technician Current insurance: Unknown  Reason for call: 2024 PAP Re-enrollment for Jardiance and Rybelsus  Outreach:  Unsuccessful telephone call attempt #2 to patient.   HIPAA compliant voicemail left requesting a return call  Plan:  -I will make another outreach attempt to patient within 3-4 business days.   Thanks,  Reed Breech, [place] [phone]

## 2022-06-14 ENCOUNTER — Telehealth: Payer: Self-pay | Admitting: Pharmacy Technician

## 2022-06-14 DIAGNOSIS — Z596 Low income: Secondary | ICD-10-CM

## 2022-06-14 NOTE — Progress Notes (Signed)
[place] [facility])                                            [place] Team    06/14/2022  Kevin Ryan 08-25-1950 [id number]                                      Medication Assistance Referral-FOR 2024 RE ENROLLMENT  Referral From: [place]   Medication/Company: Rybelsus / [employer] Patient application portion:  Education officer, museum portion: Faxed  to Dr. Carollee Leitz Provider address/fax verified via: Office website   Kevin Ryan, [place]  [phone]

## 2022-07-09 ENCOUNTER — Telehealth: Payer: Self-pay | Admitting: Pharmacy Technician

## 2022-07-09 DIAGNOSIS — Z596 Low income: Secondary | ICD-10-CM

## 2022-07-09 NOTE — Progress Notes (Signed)
[place] [facility])                                            [place] Team    07/09/2022  KAEDEN MESTER [birth year] [id number]  Received both patient and provider portion(s) of patient assistance application(s) for Rybelsus. Faxed completed application and required documents into [employer].    Jojo Geving P. [place] Sigmund, [place]  [phone]

## 2022-07-17 ENCOUNTER — Other Ambulatory Visit: Payer: Self-pay | Admitting: Internal Medicine

## 2022-07-17 DIAGNOSIS — E1165 Type 2 diabetes mellitus with hyperglycemia: Secondary | ICD-10-CM

## 2022-07-17 DIAGNOSIS — E1159 Type 2 diabetes mellitus with other circulatory complications: Secondary | ICD-10-CM

## 2022-07-17 MED ORDER — EMPAGLIFLOZIN 25 MG PO TABS: 25.0000 mg | ORAL_TABLET | Freq: Every day | ORAL | 0 refills | Status: DC

## 2022-07-17 NOTE — Telephone Encounter (Signed)
Patient has TOC scheduled with Dr. Volanda Napoleon on 09/21/22 Okay to fill Jardiance until appt Last A1c 7.8 and visit 03/21/22. Last CMET GFR 72.8 Pended a 90 day supply until appointment

## 2022-07-17 NOTE — Telephone Encounter (Signed)
Pt need refill on jardiance. Pt states the medication usually comes to the office. Pt would like to be called because he is out of refills  Rx #[id number]

## 2022-08-09 ENCOUNTER — Ambulatory Visit: Payer: Medicare Other | Admitting: Dermatology

## 2022-08-22 ENCOUNTER — Telehealth: Payer: Self-pay

## 2022-08-22 ENCOUNTER — Telehealth: Payer: Self-pay | Admitting: Pharmacy Technician

## 2022-08-22 ENCOUNTER — Telehealth: Payer: Self-pay | Admitting: Internal Medicine

## 2022-08-22 DIAGNOSIS — Z596 Low income: Secondary | ICD-10-CM

## 2022-08-22 NOTE — Progress Notes (Signed)
[place] Ryan - [place])                                            [place] Team    08/22/2022  Kevin Ryan 07-13-1951 [id number]  Care coordination call placed to [place] in regard to Rybelsus application.   Spoke to [place] who informs patient is APPROVED 08/11/22-07/31/23. Medication will automatically fill and ship to provider's address on file based on last fill date in 2023. Patient may call [place] at [phone] if shipment has not arrived and patient does not have sufficient supply.  Amelianna Meller P. Eamonn Sermeno, [place]  [phone]

## 2022-08-22 NOTE — Telephone Encounter (Signed)
Patient was advised by Sharee Pimple one of the pharmacist to call office to see if office had any  samples of empagliflozin (JARDIANCE) 25 MG TABS tablet, until she could get his patient assistance going.

## 2022-08-22 NOTE — Telephone Encounter (Signed)
Called pt to inform him that his  patient assistance meds are available for his pick up.   Medication: Rybelsus 7 mg Sig: Take 7 mg Daily  QTY: 4 boxes.   Medication has been placed in a brown bag labeled with pts name and placed in the designated pick up area up front.

## 2022-08-23 NOTE — Telephone Encounter (Signed)
I tried to route this to the pool but it wouldn't let me

## 2022-08-23 NOTE — Telephone Encounter (Signed)
Medication Samples have been provided to the patient.  Drug name: Jardiance    Strength: 25        Qty: 4bx       LOT: [id number] Exp.Date: 05/2024  Dosing instructions: One tablet daily  The patient has been instructed regarding the correct time, dose, and frequency of taking this medication, including desired effects and most common side effects.   Kevin Ryan 8:20 AM 08/23/2022

## 2022-08-23 NOTE — Telephone Encounter (Signed)
Pt returned call - advised samples here for pick up.

## 2022-08-23 NOTE — Telephone Encounter (Signed)
Lm for pt to cb.

## 2022-09-20 NOTE — Patient Instructions (Incomplete)
It was a pleasure meeting you today. Thank you for allowing me to take part in your health care.  Our goals for today as we discussed include:  We will get some labs today.  If they are abnormal or we need to do something about them, I will call you.  If they are normal, I will send you a message on MyChart (if it is active) or a letter in the mail.  If you don't hear from [place] in 2 weeks, please call the office at the number below.    Recommend Shingles vaccine.  This is a 2 dose series and can be given at your local pharmacy.  Please talk to your pharmacist about this.   Foot exam at next visit  Check with Noreene Larsson for refills on your Jardiance and Rybelsus  Please schedule appointment for Medicare Annual Wellness  If you have any questions or concerns, please do not hesitate to call the office at [phone].  I look forward to our next visit and until then take care and stay safe.  Regards,   Dana Allan, MD   [facility]

## 2022-09-21 ENCOUNTER — Ambulatory Visit (INDEPENDENT_AMBULATORY_CARE_PROVIDER_SITE_OTHER): Payer: Medicare Other | Admitting: Family Medicine

## 2022-09-21 ENCOUNTER — Encounter: Payer: Self-pay | Admitting: Family Medicine

## 2022-09-21 VITALS — BP 118/78 | HR 65 | Temp 97.6°F | Ht 72.0 in | Wt 236.2 lb

## 2022-09-21 DIAGNOSIS — E1159 Type 2 diabetes mellitus with other circulatory complications: Secondary | ICD-10-CM | POA: Diagnosis not present

## 2022-09-21 DIAGNOSIS — E7849 Other hyperlipidemia: Secondary | ICD-10-CM | POA: Diagnosis not present

## 2022-09-21 DIAGNOSIS — R7989 Other specified abnormal findings of blood chemistry: Secondary | ICD-10-CM

## 2022-09-21 DIAGNOSIS — I700 Atherosclerosis of aorta: Secondary | ICD-10-CM

## 2022-09-21 DIAGNOSIS — I152 Hypertension secondary to endocrine disorders: Secondary | ICD-10-CM | POA: Diagnosis not present

## 2022-09-21 DIAGNOSIS — E559 Vitamin D deficiency, unspecified: Secondary | ICD-10-CM | POA: Diagnosis not present

## 2022-09-21 DIAGNOSIS — D696 Thrombocytopenia, unspecified: Secondary | ICD-10-CM

## 2022-09-21 DIAGNOSIS — E1165 Type 2 diabetes mellitus with hyperglycemia: Secondary | ICD-10-CM

## 2022-09-21 LAB — CBC WITH DIFFERENTIAL/PLATELET
Basophils Absolute: 0 10*3/uL (ref 0.0–0.1)
Basophils Relative: 0.5 % (ref 0.0–3.0)
Eosinophils Absolute: 0.3 10*3/uL (ref 0.0–0.7)
Eosinophils Relative: 3.3 % (ref 0.0–5.0)
HCT: 42.8 % (ref 39.0–52.0)
Hemoglobin: 14.5 g/dL (ref 13.0–17.0)
Lymphocytes Relative: 24.8 % (ref 12.0–46.0)
Lymphs Abs: 2.1 10*3/uL (ref 0.7–4.0)
MCHC: 34 g/dL (ref 30.0–36.0)
MCV: 97.4 fl (ref 78.0–100.0)
Monocytes Absolute: 0.6 10*3/uL (ref 0.1–1.0)
Monocytes Relative: 6.8 % (ref 3.0–12.0)
Neutro Abs: 5.4 10*3/uL (ref 1.4–7.7)
Neutrophils Relative %: 64.6 % (ref 43.0–77.0)
Platelets: 146 10*3/uL — ABNORMAL LOW (ref 150.0–400.0)
RBC: 4.39 Mil/uL (ref 4.22–5.81)
RDW: 14.3 % (ref 11.5–15.5)
WBC: 8.4 10*3/uL (ref 4.0–10.5)

## 2022-09-21 LAB — COMPREHENSIVE METABOLIC PANEL
ALT: 14 U/L (ref 0–53)
AST: 16 U/L (ref 0–37)
Albumin: 4.6 g/dL (ref 3.5–5.2)
Alkaline Phosphatase: 59 U/L (ref 39–117)
BUN: 17 mg/dL (ref 6–23)
CO2: 28 mEq/L (ref 19–32)
Calcium: 9.5 mg/dL (ref 8.4–10.5)
Chloride: 101 mEq/L (ref 96–112)
Creatinine, Ser: 0.94 mg/dL (ref 0.40–1.50)
GFR: 81.2 mL/min (ref >60.00)
Glucose, Bld: 142 mg/dL — ABNORMAL HIGH (ref 70–99)
Potassium: 4.4 mEq/L (ref 3.5–5.1)
Sodium: 139 mEq/L (ref 135–145)
Total Bilirubin: 0.5 mg/dL (ref 0.2–1.2)
Total Protein: 7 g/dL (ref 6.0–8.3)

## 2022-09-21 LAB — LIPID PANEL
Cholesterol: 94 mg/dL (ref 0–200)
HDL: 32.2 mg/dL — ABNORMAL LOW (ref >39.00)
NonHDL: 61.53
Total CHOL/HDL Ratio: 3
Triglycerides: 292 mg/dL — ABNORMAL HIGH (ref 0.0–149.0)
VLDL: 58.4 mg/dL — ABNORMAL HIGH (ref 0.0–40.0)

## 2022-09-21 LAB — LDL CHOLESTEROL, DIRECT: Direct LDL: 34 mg/dL

## 2022-09-21 LAB — MICROALBUMIN / CREATININE URINE RATIO
Creatinine,U: 69.5 mg/dL
Microalb Creat Ratio: 2.4 mg/g (ref 0.0–30.0)
Microalb, Ur: 1.6 mg/dL (ref 0.0–1.9)

## 2022-09-21 LAB — VITAMIN B12: Vitamin B-12: >1500 pg/mL — ABNORMAL HIGH (ref 211–911)

## 2022-09-21 LAB — HEMOGLOBIN A1C: Hgb A1c MFr Bld: 7.5 % — ABNORMAL HIGH (ref 4.6–6.5)

## 2022-09-21 LAB — VITAMIN D 25 HYDROXY (VIT D DEFICIENCY, FRACTURES): VITD: 15.73 ng/mL — ABNORMAL LOW (ref 30.00–100.00)

## 2022-09-21 NOTE — Progress Notes (Signed)
   SUBJECTIVE:   Chief Complaint  Patient presents with   Transitions Of Care   HPI Patient presents to clinic to transfer care.  No acute concerns today.  Diabetes type 2 Asymptomatic.  Does not check CBGs at home.  Currently on Rybelsus 7 mg daily daily, metformin XR 1000 mg twice daily and Jardiance 25 mg daily.  Tolerating medications well.  Hyperlipidemia/CAD On atorvastatin 40 mg daily, Zetia 10 mg daily, ASA 81 mg, metoprolol 50 mg twice daily and fenofibrate 134 mg daily.  Tolerating medications well, no myalgias.  Hypertension Asymptomatic.  Compliant with current medication lisinopril 2.5 mg daily and tolerating well.   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  Constitutional:      General: He is not in acute distress.    Appearance: He is normal weight. He is not ill-appearing.  HENT:     Head: Normocephalic.  Eyes:     Conjunctiva/sclera: Conjunctivae normal.  Neck:     Thyroid: No thyromegaly or thyroid tenderness.  Cardiovascular:     Rate and Rhythm: Normal rate and regular rhythm.     Pulses: Normal pulses.  Pulmonary:     Effort: Pulmonary effort is normal.     Breath sounds: Normal breath sounds.  Abdominal:     General: Bowel sounds are normal.  Neurological:     Mental Status: He is alert. Mental status is at baseline.  Psychiatric:        Mood and Affect: Mood normal.        Behavior: Behavior normal.        Thought Content: Thought content normal.        Judgment: Judgment normal.     ASSESSMENT/PLAN:  Hypertension associated with diabetes ([place]) Assessment & Plan: Chronic.  Stable. Continue lisinopril 2.5 mg daily Maintain blood pressure less than  140/90 per JNC 8 guidelines for age Check CMet   Orders: -     Hemoglobin A1c; Future -     CBC with Differential/Platelet; Future -     Comprehensive metabolic panel; Future -     Vitamin B12; Future -     Microalbumin / creatinine urine ratio; Future  Type 2 diabetes mellitus with hyperglycemia, without long-term current use of insulin (HCC) Assessment & Plan: Chronic.  Stable. Continue Jardiance 25 g daily Continue metformin 1000 mg twice daily Continue Rybelsus 70 mg daily Check A1c   Other hyperlipidemia Assessment & Plan: Chronic. Continue Crestor 40 mg daily Continue Zetia 10 mg daily Continue fenofibrate 134 mg daily Check lipids  Orders: -     Lipid panel; Future -     LDL cholesterol, direct  Thrombocytopenia (HCC) Assessment & Plan: Chronic.  Currently on ASA and Plavix. Check CBC    Aortic atherosclerosis (HCC) Assessment & Plan: Chronic.  Noted on CT chest 03/21. Currently on statin, Zetia and fenofibrate Checking lipids today    Vitamin D deficiency -     VITAMIN D 25 Hydroxy (Vit-D Deficiency, Fractures); Future   PDMP reviewed  Return in about 3 months (around 12/20/2022) for PCP.  Carollee Leitz, MD

## 2022-09-24 ENCOUNTER — Encounter: Payer: Self-pay | Admitting: Family Medicine

## 2022-09-24 ENCOUNTER — Other Ambulatory Visit: Payer: Self-pay | Admitting: Family Medicine

## 2022-09-24 DIAGNOSIS — D696 Thrombocytopenia, unspecified: Secondary | ICD-10-CM

## 2022-09-24 DIAGNOSIS — E559 Vitamin D deficiency, unspecified: Secondary | ICD-10-CM

## 2022-09-24 HISTORY — DX: Thrombocytopenia, unspecified: D69.6

## 2022-09-24 MED ORDER — VITAMIN D (ERGOCALCIFEROL) 1.25 MG (50000 UNIT) PO CAPS: 50000.0000 [IU] | ORAL_CAPSULE | ORAL | 1 refills | Status: DC

## 2022-09-24 NOTE — Assessment & Plan Note (Signed)
Chronic.  Currently on ASA and Plavix. Check CBC

## 2022-09-24 NOTE — Assessment & Plan Note (Signed)
Chronic. Continue Crestor 40 mg daily Continue Zetia 10 mg daily Continue fenofibrate 134 mg daily Check lipids

## 2022-09-24 NOTE — Assessment & Plan Note (Addendum)
Chronic.  Stable. Continue lisinopril 2.5 mg daily Maintain blood pressure less than 140/90 per JNC 8 guidelines for age Check CMet

## 2022-09-24 NOTE — Assessment & Plan Note (Signed)
Chronic.  Stable. Continue Jardiance 25 g daily Continue metformin 1000 mg twice daily Continue Rybelsus 70 mg daily Check A1c

## 2022-09-24 NOTE — Assessment & Plan Note (Addendum)
Chronic.  Noted on CT chest 03/21. Currently on statin, Zetia and fenofibrate Checking lipids today

## 2022-11-02 ENCOUNTER — Telehealth: Payer: Self-pay | Admitting: Family Medicine

## 2022-11-02 NOTE — Telephone Encounter (Signed)
Contacted Kevin Ryan to schedule their annual wellness visit. Patient declined to schedule AWV at this time.  Thank you,  [place] Direct dial  [phone]

## 2022-11-13 ENCOUNTER — Telehealth: Payer: Self-pay | Admitting: Family Medicine

## 2022-11-13 ENCOUNTER — Other Ambulatory Visit: Payer: Self-pay

## 2022-11-13 DIAGNOSIS — E1159 Type 2 diabetes mellitus with other circulatory complications: Secondary | ICD-10-CM

## 2022-11-13 MED ORDER — RYBELSUS 7 MG PO TABS: 7.0000 mg | ORAL_TABLET | Freq: Every day | ORAL | Status: DC

## 2022-11-13 NOTE — Telephone Encounter (Signed)
Pt is calling in stating the the program [employer] assistance program is asking him to get in touch with his provider to get them to send in a new prescription.  Rx Rybelus 7 MG is the medication that they need to have clarification on.

## 2022-11-15 ENCOUNTER — Other Ambulatory Visit: Payer: Self-pay | Admitting: Family Medicine

## 2022-11-15 DIAGNOSIS — I152 Hypertension secondary to endocrine disorders: Secondary | ICD-10-CM

## 2022-11-15 MED ORDER — RYBELSUS 7 MG PO TABS: 7.0000 mg | ORAL_TABLET | Freq: Every day | ORAL | 12 refills | Status: DC

## 2022-11-16 NOTE — Telephone Encounter (Signed)
It for patient assistance please print.Through the maker of rybelsus.

## 2022-11-17 NOTE — Telephone Encounter (Addendum)
Faxed new script to [employer] , patient does not need application this was approved see phone note Pattricia Boss 07/09/22 patient is approved until December just needed clarified script. Faxed to [phone] scripts must be in 60 day supply or 120. Completed. See Jan note for approval until 1/25

## 2022-11-22 ENCOUNTER — Telehealth: Payer: Self-pay | Admitting: *Deleted

## 2022-11-22 NOTE — Telephone Encounter (Signed)
Patient notified that Patient Assistance Medication are in the office & are ready for pick up.   Medication:  Rybelsus   Quantity: 4  Lot# [id number]  Exp: 02/28/2024

## 2023-02-26 ENCOUNTER — Telehealth: Payer: Self-pay

## 2023-02-26 NOTE — Telephone Encounter (Signed)
Patient notified that Patient Assistance Medication are in the office & are ready for pick up.     Medication:  Rybelsus 7mg    Quantity: 4   Lot# [id number]   Exp: 04/29/2024

## 2023-04-17 ENCOUNTER — Other Ambulatory Visit: Payer: Self-pay | Admitting: Family Medicine

## 2023-04-17 DIAGNOSIS — E785 Hyperlipidemia, unspecified: Secondary | ICD-10-CM

## 2023-04-20 LAB — HM DIABETES EYE EXAM

## 2023-06-08 ENCOUNTER — Telehealth: Payer: Self-pay | Admitting: Family Medicine

## 2023-06-08 NOTE — Telephone Encounter (Signed)
Patient just walked in and needs a refill on his medication. The name is empagliflozin (JARDIANCE) 25 MG TABS tablet. The pharmacy he use is [facility] - [place], [place] - [address] [place], [place] [address] Phone: [phone]  Fax: [phone]  His number is [phone] RX number is [id number] He would like to see if he can get some samples.

## 2023-06-08 NOTE — Telephone Encounter (Signed)
Patient walked in and dropped off a form [employer] for provider to look at. He wants to know if he qualifies for it. The form is in the colorful folder. His number is [phone]

## 2023-06-08 NOTE — Telephone Encounter (Signed)
Will place in pharmacist basket for pt assistant for her to look over.

## 2023-06-08 NOTE — Telephone Encounter (Signed)
Noted. Pt received 4 boxes

## 2023-06-08 NOTE — Telephone Encounter (Signed)
Patient came and picked up Jardiance samples. I gave it to him.

## 2023-06-08 NOTE — Telephone Encounter (Signed)
Pt is out of medication ok to provide sample to get pt though until his pt assistant is approved?

## 2023-06-15 NOTE — Telephone Encounter (Signed)
Faxed and placed in [employer]

## 2023-06-26 ENCOUNTER — Telehealth: Payer: Self-pay

## 2023-06-26 NOTE — Telephone Encounter (Signed)
Re enrollment

## 2023-06-27 NOTE — Telephone Encounter (Signed)
Call patient and left HIPAA compliant VM

## 2023-07-02 ENCOUNTER — Telehealth: Payer: Self-pay

## 2023-07-02 ENCOUNTER — Other Ambulatory Visit (HOSPITAL_COMMUNITY): Payer: Self-pay

## 2023-07-02 ENCOUNTER — Other Ambulatory Visit: Payer: Self-pay | Admitting: Pharmacist

## 2023-07-02 DIAGNOSIS — E1159 Type 2 diabetes mellitus with other circulatory complications: Secondary | ICD-10-CM

## 2023-07-02 DIAGNOSIS — E1165 Type 2 diabetes mellitus with hyperglycemia: Secondary | ICD-10-CM

## 2023-07-02 NOTE — Telephone Encounter (Signed)
Oh! Apologies, thank you so much for getting that though! Our team will start on it right away.

## 2023-07-02 NOTE — Telephone Encounter (Signed)
PAP: Patient assistance application for [place] has been approved by PAP Companies: [employer] from 06/21/2023 to 06/20/2024. Medication should be delivered to PAP Delivery: Home For further shipping updates, please contact [employer] ([employer]) at [phone] Pt ID is: [id number]

## 2023-07-02 NOTE — Progress Notes (Signed)
Called patient to inform him that the Medication Access team has been trying to contact him regarding Novo re-enrollment for 2025.   He states that he would appreciate [name] team's assistance with online re-enrollment for next year.   He also reports that his metformin Rx expired. He requested refill last week, though has not been received. Will send request to clinic pool for refill.

## 2023-07-02 NOTE — Telephone Encounter (Signed)
Sent Re-Enroll Application for Rybelsus per Patient's request for 2025.

## 2023-07-03 ENCOUNTER — Other Ambulatory Visit: Payer: Self-pay

## 2023-07-03 DIAGNOSIS — E1165 Type 2 diabetes mellitus with hyperglycemia: Secondary | ICD-10-CM

## 2023-07-03 DIAGNOSIS — E1159 Type 2 diabetes mellitus with other circulatory complications: Secondary | ICD-10-CM

## 2023-07-03 MED ORDER — METFORMIN HCL ER 500 MG PO TB24: ORAL_TABLET | ORAL | 3 refills | Status: DC

## 2023-07-03 NOTE — Progress Notes (Signed)
Metformin refilled

## 2023-07-06 ENCOUNTER — Telehealth: Payer: Self-pay

## 2023-07-06 NOTE — Telephone Encounter (Signed)
PAP: Application for Rybelsus has been submitted to PAP Companies: NovoNordisk, Therapist, sports

## 2023-07-08 ENCOUNTER — Other Ambulatory Visit: Payer: Self-pay

## 2023-07-08 ENCOUNTER — Emergency Department
Admission: EM | Admit: 2023-07-08 | Discharge: 2023-07-08 | Disposition: A | Discharge status: Home / Self Care | Payer: Medicare Other | Attending: Emergency Medicine | Admitting: Emergency Medicine

## 2023-07-08 ENCOUNTER — Emergency Department: Payer: Medicare Other

## 2023-07-08 DIAGNOSIS — R1084 Generalized abdominal pain: Secondary | ICD-10-CM | POA: Insufficient documentation

## 2023-07-08 DIAGNOSIS — R197 Diarrhea, unspecified: Secondary | ICD-10-CM | POA: Insufficient documentation

## 2023-07-08 LAB — COMPREHENSIVE METABOLIC PANEL
ALT: 15 U/L (ref 0–44)
AST: 28 U/L (ref 15–41)
Albumin: 4.2 g/dL (ref 3.5–5.0)
Alkaline Phosphatase: 69 U/L (ref 38–126)
Anion gap: 14 (ref 5–15)
BUN: 18 mg/dL (ref 8–23)
CO2: 25 mmol/L (ref 22–32)
Calcium: 9.1 mg/dL (ref 8.9–10.3)
Chloride: 95 mmol/L — ABNORMAL LOW (ref 98–111)
Creatinine, Ser: 0.88 mg/dL (ref 0.61–1.24)
GFR, Estimated: >60 mL/min (ref >60)
Glucose, Bld: 238 mg/dL — ABNORMAL HIGH (ref 70–99)
Potassium: 3.5 mmol/L (ref 3.5–5.1)
Sodium: 134 mmol/L — ABNORMAL LOW (ref 135–145)
Total Bilirubin: 0.8 mg/dL (ref <1.2)
Total Protein: 7.9 g/dL (ref 6.5–8.1)

## 2023-07-08 LAB — CBC
HCT: 46.5 % (ref 39.0–52.0)
Hemoglobin: 15.7 g/dL (ref 13.0–17.0)
MCH: 32.9 pg (ref 26.0–34.0)
MCHC: 33.8 g/dL (ref 30.0–36.0)
MCV: 97.5 fL (ref 80.0–100.0)
Platelets: 149 10*3/uL — ABNORMAL LOW (ref 150–400)
RBC: 4.77 MIL/uL (ref 4.22–5.81)
RDW: 13.6 % (ref 11.5–15.5)
WBC: 15.8 10*3/uL — ABNORMAL HIGH (ref 4.0–10.5)
nRBC: 0.1 % (ref 0.0–0.2)

## 2023-07-08 LAB — LIPASE, BLOOD: Lipase: 23 U/L (ref 11–51)

## 2023-07-08 LAB — CBG MONITORING, ED: Glucose-Capillary: 233 mg/dL — ABNORMAL HIGH (ref 70–99)

## 2023-07-08 MED ORDER — SUCRALFATE 1 G PO TABS: 1.0000 g | ORAL_TABLET | Freq: Four times a day (QID) | ORAL | 1 refills | Status: DC

## 2023-07-08 MED ORDER — IOHEXOL 350 MG/ML SOLN
100.0000 mL | Freq: Once | INTRAVENOUS | Status: AC | PRN
  Administered 2023-07-08: 100 mL via INTRAVENOUS

## 2023-07-08 MED ORDER — SODIUM CHLORIDE 0.9 % IV BOLUS
1000.0000 mL | Freq: Once | INTRAVENOUS | Status: AC
  Administered 2023-07-08: 1000 mL via INTRAVENOUS

## 2023-07-08 NOTE — ED Triage Notes (Signed)
Pt sts that he has been having diarrhea for the last two weeks. Pt sts that he has been having a lot of belching that has a mal odor to it.

## 2023-07-08 NOTE — ED Provider Notes (Signed)
[facility] Provider Note    Event Date/Time   First MD Initiated Contact with Patient 07/08/23 1853     (approximate)   History   Diarrhea   HPI  Kevin Ryan is a 72 y.o. male who presents to the emergency department today with concerns for 2-week history of diarrhea.  This has been accompanied by belching.  The patient states he has also had generalized abdominal pain.  He denies similar symptoms in the past.  Denies any fevers.  No new medications.     Physical Exam   Triage Vital Signs: ED Triage Vitals  Encounter Vitals Group     BP 07/08/23 1450 117/66     Systolic BP Percentile --      Diastolic BP Percentile --      Pulse Rate 07/08/23 1450 93     Resp 07/08/23 1450 18     Temp 07/08/23 1450 (!) 97.5 F (36.4 C)     Temp Source 07/08/23 1450 Oral     SpO2 07/08/23 1450 97 %     Weight 07/08/23 1451 230 lb (104.3 kg)     Height 07/08/23 1451 6\' 1"  (1.854 m)     Head Circumference --      Peak Flow --      Pain Score 07/08/23 1451 4     Pain Loc --      Pain Education --      Exclude from Growth Chart --     Most recent vital signs: Vitals:   07/08/23 1450  BP: 117/66  Pulse: 93  Resp: 18  Temp: (!) 97.5 F (36.4 C)  SpO2: 97%   General: Awake, alert, oriented. CV:  Good peripheral perfusion. Regular rate and rhythm. Resp:  Normal effort. Lungs clear. Abd:  No distention. Diffusely tender to palpation.   ED Results / Procedures / Treatments   Labs (all labs ordered are listed, but only abnormal results are displayed) Labs Reviewed  COMPREHENSIVE METABOLIC PANEL - Abnormal; Notable for the following components:      Result Value   Sodium 134 (*)    Chloride 95 (*)    Glucose, Bld 238 (*)    All other components within normal limits  CBC - Abnormal; Notable for the following components:   WBC 15.8 (*)    Platelets 149 (*)    All other components within normal limits  CBG MONITORING, ED - Abnormal; Notable for  the following components:   Glucose-Capillary 233 (*)    All other components within normal limits  C DIFFICILE QUICK SCREEN W PCR REFLEX    GASTROINTESTINAL PANEL BY PCR, STOOL (REPLACES STOOL CULTURE)  LIPASE, BLOOD  URINALYSIS, ROUTINE W REFLEX MICROSCOPIC     EKG  None   RADIOLOGY I independently interpreted and visualized the CT abd/pel. My interpretation: Gallstone, no free air Radiology interpretation:  IMPRESSION:  1. Nonobstructing 4 mm distal right ureteral calculus, just proximal  to the right UVJ.  2. Cholelithiasis without cholecystitis.  3. Hepatic steatosis.  4.  Aortic Atherosclerosis (ICD10-I70.0).     PROCEDURES:  Critical Care performed: No   MEDICATIONS ORDERED IN ED: Medications - No data to display   IMPRESSION / MDM / ASSESSMENT AND PLAN / ED COURSE  I reviewed the triage vital signs and the nursing notes.  Differential diagnosis includes, but is not limited to, gastroenteritis, medication side effect, functional diarrhea  Patient's presentation is most consistent with acute presentation with potential threat to life or bodily function.   Patient presented to the emergency department today because concerns for diarrhea, abdominal pain and foul-smelling belching.  Has been going on for 2 weeks.  On exam patient does have some mild diffuse abdominal tenderness.  Blood work without concerning leukocytosis or electrolyte abnormality.  Did order a CT scan given tenderness on exam.  CT scan without obvious etiology of the patient's diarrhea.  Did show a gallstone and a kidney stone however do not think this would explain the patient's diarrhea.  At this time however do think is reasonable for patient be discharged home.  Will have patient follow-up with GI.     FINAL CLINICAL IMPRESSION(S) / ED DIAGNOSES   Final diagnoses:  Diarrhea, unspecified type      Note:  This document was prepared using Dragon voice  recognition software and may include unintentional dictation errors.    Kevin Semen, MD 07/08/23 [phone]

## 2023-07-08 NOTE — Discharge Instructions (Addendum)
As we discussed your CT scan did not show an obvious cause of the diarrhea, but did show a gallstone and kidney stone. Please seek medical attention for any high fevers, chest pain, shortness of breath, change in behavior, persistent vomiting, bloody stool or any other new or concerning symptoms.

## 2023-07-10 ENCOUNTER — Encounter: Payer: Self-pay | Admitting: Pharmacist

## 2023-07-10 NOTE — Progress Notes (Addendum)
Manufacturer Assistance Program (MAP) Application   Manufacturer: Novo Nordisk Medication(s): Rybelsus 7 mg Patient Portion of Application: Completed via online enrollment tool with assistance from [name] Medication Advocate team. Income Documentation: Online enrollment completed with patient.  and Electronic verification elected. No income documentation required at this time.  Provider Portion of Application:  Received form [name] Med Advocate Team via FAX 12/10: Provider page filled out and placed in PCP inbox for review/signature Prescription(s): Included in MAP application.  Next Steps:   PCP-signed form faxed to [name] team with confirmation.  Chart sent to [name] Team   Loree Fee, PharmD Clinical Pharmacist [facility] [phone]

## 2023-07-11 ENCOUNTER — Telehealth: Payer: Self-pay | Admitting: Family Medicine

## 2023-07-11 NOTE — Telephone Encounter (Signed)
Pt came into the office to drop off novo application. Placed in provider folder up front

## 2023-07-11 NOTE — Telephone Encounter (Signed)
Called pt and let him know that the form has already been faxed over to the company. Pt wanted to know if some of the medication is causing him stomach issue.  He wanted to know if there was some medication that he might be able to get off of.

## 2023-07-12 ENCOUNTER — Other Ambulatory Visit (HOSPITAL_COMMUNITY): Payer: Self-pay

## 2023-07-12 ENCOUNTER — Telehealth: Payer: Self-pay

## 2023-07-12 NOTE — Telephone Encounter (Signed)
Provider portion received, faxed to manufacturer with copy of insurance card

## 2023-07-12 NOTE — Telephone Encounter (Signed)
Patient's Ryan, Kevin Ryan, sent the following message to Dr. Dana Allan via MyChart:   "This message is from Kevin Ryan, Kevin Ryan of 47 years, I would like for him not to know I have contacted you regarding his upcoming appointment on Friday (07/13/2023).  He has been taking one of his medications first thing in the morning on an empty stomach as instructed, then he waits 30 minutes and takes the rest of his medications.  I believe it is 9 or 10 more pills, then most every day he doesn't eat anything until late in the day.  I have told him he needs to eat something so these pills do not sit on an empty stomach.  He also has not been checking his blood sugar like he should.  Please make him remove his shoes, his feet are cracking open, and he has some kind of growth on one of his feet.  Please keep what I have told you confidential.  Thank you, Kevin Ryan"

## 2023-07-13 ENCOUNTER — Encounter: Payer: Self-pay | Admitting: Family Medicine

## 2023-07-13 ENCOUNTER — Ambulatory Visit (INDEPENDENT_AMBULATORY_CARE_PROVIDER_SITE_OTHER): Payer: Medicare Other | Admitting: Family Medicine

## 2023-07-13 VITALS — BP 110/62 | HR 74 | Temp 98.2°F | Resp 18 | Ht 73.0 in | Wt 232.1 lb

## 2023-07-13 DIAGNOSIS — E118 Type 2 diabetes mellitus with unspecified complications: Secondary | ICD-10-CM | POA: Diagnosis not present

## 2023-07-13 DIAGNOSIS — K760 Fatty (change of) liver, not elsewhere classified: Secondary | ICD-10-CM

## 2023-07-13 DIAGNOSIS — E1159 Type 2 diabetes mellitus with other circulatory complications: Secondary | ICD-10-CM | POA: Diagnosis not present

## 2023-07-13 DIAGNOSIS — I152 Hypertension secondary to endocrine disorders: Secondary | ICD-10-CM

## 2023-07-13 DIAGNOSIS — R1011 Right upper quadrant pain: Secondary | ICD-10-CM | POA: Diagnosis not present

## 2023-07-13 DIAGNOSIS — E7849 Other hyperlipidemia: Secondary | ICD-10-CM

## 2023-07-13 LAB — COMPREHENSIVE METABOLIC PANEL
ALT: 28 U/L (ref 0–53)
AST: 26 U/L (ref 0–37)
Albumin: 4.1 g/dL (ref 3.5–5.2)
Alkaline Phosphatase: 72 U/L (ref 39–117)
BUN: 15 mg/dL (ref 6–23)
CO2: 27 meq/L (ref 19–32)
Calcium: 9.6 mg/dL (ref 8.4–10.5)
Chloride: 100 meq/L (ref 96–112)
Creatinine, Ser: 0.95 mg/dL (ref 0.40–1.50)
GFR: 79.72 mL/min (ref >60.00)
Glucose, Bld: 251 mg/dL — ABNORMAL HIGH (ref 70–99)
Potassium: 4.1 meq/L (ref 3.5–5.1)
Sodium: 138 meq/L (ref 135–145)
Total Bilirubin: 0.6 mg/dL (ref 0.2–1.2)
Total Protein: 7.1 g/dL (ref 6.0–8.3)

## 2023-07-13 LAB — CBC WITH DIFFERENTIAL/PLATELET
Basophils Absolute: 0.1 10*3/uL (ref 0.0–0.1)
Basophils Relative: 0.9 % (ref 0.0–3.0)
Eosinophils Absolute: 3.1 10*3/uL — ABNORMAL HIGH (ref 0.0–0.7)
Eosinophils Relative: 29.6 % — ABNORMAL HIGH (ref 0.0–5.0)
HCT: 45.5 % (ref 39.0–52.0)
Hemoglobin: 15.1 g/dL (ref 13.0–17.0)
Lymphocytes Relative: 20.3 % (ref 12.0–46.0)
Lymphs Abs: 2.1 10*3/uL (ref 0.7–4.0)
MCHC: 33.1 g/dL (ref 30.0–36.0)
MCV: 99.2 fL (ref 78.0–100.0)
Monocytes Absolute: 0.6 10*3/uL (ref 0.1–1.0)
Monocytes Relative: 5.3 % (ref 3.0–12.0)
Neutro Abs: 4.7 10*3/uL (ref 1.4–7.7)
Neutrophils Relative %: 43.9 % (ref 43.0–77.0)
Platelets: 161 10*3/uL (ref 150.0–400.0)
RBC: 4.58 Mil/uL (ref 4.22–5.81)
RDW: 14.1 % (ref 11.5–15.5)
WBC: 10.6 10*3/uL — ABNORMAL HIGH (ref 4.0–10.5)

## 2023-07-13 LAB — LIPASE: Lipase: 19 U/L (ref 11.0–59.0)

## 2023-07-13 LAB — HEMOGLOBIN A1C: Hgb A1c MFr Bld: 8.9 % — ABNORMAL HIGH (ref 4.6–6.5)

## 2023-07-13 NOTE — Patient Instructions (Addendum)
It was a pleasure meeting you today. Thank you for allowing me to take part in your health care.  Our goals for today as we discussed include:  We will get some labs today.  If they are abnormal or we need to do something about them, I will call you.  If they are normal, I will send you a message on MyChart (if it is active) or a letter in the mail.  If you don't hear from [place] in 2 weeks, please call the office at the number below.   Hold the Metformin for now Continue all other medications  Imaging ordered.  They will call you with appointment  This is a list of the screening recommended for you and due dates:  Health Maintenance  Topic Date Due   Screening for Lung Cancer  10/23/2020   Complete foot exam   01/26/2021   Medicare Annual Wellness Visit  08/10/2021   Zoster (Shingles) Vaccine (2 of 2) 10/19/2022   Hemoglobin A1C  03/22/2023   Flu Shot  10/29/2023*   Yearly kidney health urinalysis for diabetes  09/22/2023   Eye exam for diabetics  04/19/2024   Yearly kidney function blood test for diabetes  07/07/2024   Colon Cancer Screening  09/08/2031   DTaP/Tdap/Td vaccine (2 - Td or Tdap) 10/15/2031   Pneumonia Vaccine  Completed   Hepatitis C Screening  Completed   HPV Vaccine  Aged Out   COVID-19 Vaccine  Discontinued  *Topic was postponed. The date shown is not the original due date.    Follow up in 3 months  If you have any questions or concerns, please do not hesitate to call the office at [phone].  I look forward to our next visit and until then take care and stay safe.  Regards,   Dana Allan, MD   [facility]

## 2023-07-13 NOTE — Progress Notes (Unsigned)
SUBJECTIVE:   Chief Complaint  Patient presents with   Medical Management of Chronic Issues   HPI Presents for follow up chronic disease management  Needs foot exam  PERTINENT PMH / PSH: As above  OBJECTIVE:  BP 110/62   Pulse 74   Temp 98.2 F (36.8 C) (Oral)   Resp 18   Ht 6\' 1"  (1.854 m)   Wt 232 lb 2 oz (105.3 kg)   SpO2 99%   BMI 30.63 kg/m    Physical Exam Vitals reviewed.  Constitutional:      General: He is not in acute distress.    Appearance: Normal appearance. He is normal weight. He is not ill-appearing, toxic-appearing or diaphoretic.  Eyes:     General:        Right eye: No discharge.        Left eye: No discharge.  Cardiovascular:     Rate and Rhythm: Normal rate and regular rhythm.     Heart sounds: Normal heart sounds.  Pulmonary:     Effort: Pulmonary effort is normal.     Breath sounds: Normal breath sounds.  Abdominal:     General: Bowel sounds are normal.     Tenderness: There is abdominal tenderness.  Musculoskeletal:        General: Normal range of motion.     Cervical back: Normal range of motion.  Skin:    General: Skin is warm and dry.  Neurological:     Mental Status: He is alert and oriented to person, place, and time. Mental status is at baseline.  Psychiatric:        Mood and Affect: Mood normal.        Behavior: Behavior normal.        Thought Content: Thought content normal.        Judgment: Judgment normal.        07/13/2023   11:05 AM 03/21/2022   10:01 AM 10/07/2021   12:17 PM 07/19/2021    8:21 AM 08/10/2020    1:37 PM  Depression screen PHQ 2/9  Decreased Interest 0 0 0 0 0  Down, Depressed, Hopeless 0 0 0 0 0  PHQ - 2 Score 0 0 0 0 0  Altered sleeping 1      Tired, decreased energy 0      Change in appetite 0      Feeling bad or failure about yourself  0      Trouble concentrating 0      Moving slowly or fidgety/restless 0      Suicidal thoughts 0      PHQ-9 Score 1      Difficult doing work/chores  Not difficult at all          07/13/2023   11:05 AM  GAD 7 : Generalized Anxiety Score  Nervous, Anxious, on Edge 0  Control/stop worrying 0  Worry too much - different things 0  Trouble relaxing 0  Restless 0  Easily annoyed or irritable 0  Afraid - awful might happen 0  Total GAD 7 Score 0  Anxiety Difficulty Not difficult at all    ASSESSMENT/PLAN:  Right upper quadrant abdominal pain -     US ABDOMEN LIMITED RUQ (LIVER/GB); Future -     Lipase -     CBC with Differential/Platelet -     Comprehensive metabolic panel  Type 2 diabetes mellitus with complications (HCC) -     Hemoglobin A1c   PDMP reviewed***  No follow-ups on file.  Dana Allan, MD

## 2023-07-15 ENCOUNTER — Encounter: Payer: Self-pay | Admitting: Family Medicine

## 2023-07-15 DIAGNOSIS — R1011 Right upper quadrant pain: Secondary | ICD-10-CM | POA: Insufficient documentation

## 2023-07-15 NOTE — Assessment & Plan Note (Signed)
Diffuse hepatic steatosis seen again on recent CT abd/pelvis LFT's normal -Follow up with GI as scheduled in April.

## 2023-07-15 NOTE — Assessment & Plan Note (Signed)
Patient recently stopped Zetia  for a week due to concerns about stomach issues. -Advise to restart Zetia and continue Crestor

## 2023-07-15 NOTE — Assessment & Plan Note (Signed)
Constant abdominal pain and loose stools for the past month. No blood in stool or greasy stool. No specific food triggers identified. Pain improved with cessation of medications for a week. Increased belching, bloating, diarrhea. Recent CT abd/pelvis scan showed gallstone and kidney stone but no inflammation in the gallbladder. -Order an ultrasound for further evaluation of gallbladder and gallstone. Likely will need surgical evaluation for cholelithiasis -Stop Metformin due to potential contribution to diarrhea. -Has scheduled GI appointment for further evaluation of diarrhea

## 2023-07-15 NOTE — Assessment & Plan Note (Signed)
Patient recently stopped Zestril for a week due to concerns about stomach issues. -Advise to restart Zestril.

## 2023-07-15 NOTE — Assessment & Plan Note (Signed)
Blood sugar measured at 203 upon waking. Patient recently stopped Metformin due to concerns about stomach issues. -Hold Metformin until diarrhea resolves. -Continue Jardiance, Rybelsus  -Check A1c and other labs to assess current glycemic control.

## 2023-07-17 ENCOUNTER — Encounter: Payer: Self-pay | Admitting: Pharmacist

## 2023-07-17 NOTE — Progress Notes (Addendum)
Manufacturer Assistance Program (MAP) Application   Manufacturer: [employer] ([employer])    (Re-enrollment) Medication(s): Jardiance 25 mg   Patient Portion of Application:  12/17: Dropped off at clinic by patient.   Provider Portion of Application:  12/17: Provider portion placed in PCP inbox for signature. Filled out by pharmacist Prescription(s): Included in MAP application.   Application Status: Not submitted (pending signatures). Full application placed in PCP inbox  Next Steps: [x]    PCP signature (completed 12/17) [x]    Upon signature(s) Application to be faxed to [facility] Fax: [phone] AND scanned into patient chart. (12/17 faxed to [facility], placed in red fax folder for upload to chart) [x]    Patient signature  Forwarded to [facility] [name] Patient Advocate Team for future correspondences/re-enrollment.  Note routed to PCP Clinic Pool to ensure PCP signature is obtained and application is faxed.  *[facility] clinic team - Please Addend/update this note as the "Next Steps" are completed in office*

## 2023-07-17 NOTE — Progress Notes (Signed)
Noted  

## 2023-07-18 ENCOUNTER — Ambulatory Visit
Admission: RE | Admit: 2023-07-18 | Discharge: 2023-07-18 | Disposition: A | Discharge status: Home / Self Care | Payer: Medicare Other | Source: Ambulatory Visit | Attending: Family Medicine | Admitting: Family Medicine

## 2023-07-18 DIAGNOSIS — R1011 Right upper quadrant pain: Secondary | ICD-10-CM | POA: Insufficient documentation

## 2023-07-26 ENCOUNTER — Ambulatory Visit: Payer: Medicare Other | Admitting: Family Medicine

## 2023-07-29 ENCOUNTER — Other Ambulatory Visit: Payer: Self-pay | Admitting: Family Medicine

## 2023-07-29 ENCOUNTER — Encounter: Payer: Self-pay | Admitting: Family Medicine

## 2023-07-29 DIAGNOSIS — R7989 Other specified abnormal findings of blood chemistry: Secondary | ICD-10-CM

## 2023-07-30 ENCOUNTER — Telehealth: Payer: Self-pay

## 2023-07-30 NOTE — Telephone Encounter (Signed)
VM left asking pt to cb to schedule a lab appt as provider would like pt to have some repeat blood work. Lab ordered have already been ordered.

## 2023-08-03 ENCOUNTER — Other Ambulatory Visit (INDEPENDENT_AMBULATORY_CARE_PROVIDER_SITE_OTHER): Payer: Medicare Other

## 2023-08-03 DIAGNOSIS — R7989 Other specified abnormal findings of blood chemistry: Secondary | ICD-10-CM

## 2023-08-04 LAB — LACTATE DEHYDROGENASE: LDH: 150 U/L (ref 120–250)

## 2023-08-06 ENCOUNTER — Telehealth: Payer: Self-pay

## 2023-08-06 LAB — CBC WITH DIFFERENTIAL/PLATELET
Absolute Lymphocytes: 2028 {cells}/uL (ref 850–3900)
Absolute Monocytes: 499 {cells}/uL (ref 200–950)
Basophils Absolute: 62 {cells}/uL (ref 0–200)
Basophils Relative: 0.8 %
Eosinophils Absolute: 616 {cells}/uL — ABNORMAL HIGH (ref 15–500)
Eosinophils Relative: 7.9 %
HCT: 46.7 % (ref 38.5–50.0)
Hemoglobin: 15.3 g/dL (ref 13.2–17.1)
MCH: 32.2 pg (ref 27.0–33.0)
MCHC: 32.8 g/dL (ref 32.0–36.0)
MCV: 98.3 fL (ref 80.0–100.0)
MPV: 11.6 fL (ref 7.5–12.5)
Monocytes Relative: 6.4 %
Neutro Abs: 4594 {cells}/uL (ref 1500–7800)
Neutrophils Relative %: 58.9 %
Platelets: 132 10*3/uL — ABNORMAL LOW (ref 140–400)
RBC: 4.75 10*6/uL (ref 4.20–5.80)
RDW: 12.4 % (ref 11.0–15.0)
Total Lymphocyte: 26 %
WBC: 7.8 10*3/uL (ref 3.8–10.8)

## 2023-08-06 LAB — SEDIMENTATION RATE: Sed Rate: 29 mm/h — ABNORMAL HIGH (ref 0–20)

## 2023-08-06 LAB — C-REACTIVE PROTEIN: CRP: <3 mg/L (ref <8.0)

## 2023-08-06 LAB — PATHOLOGIST SMEAR REVIEW

## 2023-08-06 NOTE — Telephone Encounter (Signed)
 Copied from CRM [id number]. Topic: Clinical - Lab/Test Results >> Aug 06, 2023 11:31 AM Merlynn A wrote: Reason for CRM: Patient called in for test results. Advised that provider has not reviewed results as of yet. Patient is requesting call from nurse to go over results. Please contact patient at [phone].

## 2023-08-07 ENCOUNTER — Telehealth: Payer: Self-pay

## 2023-08-07 DIAGNOSIS — D721 Eosinophilia, unspecified: Secondary | ICD-10-CM

## 2023-08-07 DIAGNOSIS — D696 Thrombocytopenia, unspecified: Secondary | ICD-10-CM

## 2023-08-07 DIAGNOSIS — R7989 Other specified abnormal findings of blood chemistry: Secondary | ICD-10-CM

## 2023-08-07 DIAGNOSIS — Z8509 Personal history of malignant neoplasm of other digestive organs: Secondary | ICD-10-CM

## 2023-08-07 DIAGNOSIS — R634 Abnormal weight loss: Secondary | ICD-10-CM

## 2023-08-07 NOTE — Telephone Encounter (Signed)
 Called and spoke with patient and Diane, spouse.  Result note read.  They reported that pt has a yearly oncology follow up appointment 08/15/23 with Dr. Charlie Car. They are agreeable to a referral and would like it at [facility] since he sees oncology there.  I have pended the referral please make any changes needed.

## 2023-08-07 NOTE — Telephone Encounter (Signed)
 Copied from CRM [id number]. Topic: Clinical - Lab/Test Results >> Aug 07, 2023  3:10 PM Isabell A wrote: Reason for CRM: Spouse would like to discuss patients lab results with a nurse. Mrs.Trampe ask that we call her and not her husband to discuss results because the patient is hard of hearing.   Callback number: [phone]

## 2023-08-07 NOTE — Addendum Note (Signed)
 Addended by: Benedict Needy on: 08/07/2023 05:11 PM   Modules accepted: Orders

## 2023-08-07 NOTE — Telephone Encounter (Signed)
 Spoke to pt's wife. Informed that pcp is currently out of the office and that I would reach out to see if a covering provider could review labs. Pt's wife stated that they are concerned with some of the abnormal in the labs and wanted to be sure they were addressed

## 2023-08-07 NOTE — Addendum Note (Signed)
 Addended by: Sherlene Shams on: 08/07/2023 06:05 PM   Modules accepted: Orders

## 2023-08-09 NOTE — Telephone Encounter (Signed)
 Called and spoke with Jillyn Hidden and Diane to make them aware of the new referral.  Diane reported that they had received an email.

## 2023-08-17 ENCOUNTER — Telehealth: Payer: Self-pay | Admitting: Family Medicine

## 2023-08-17 NOTE — Telephone Encounter (Signed)
I called the patient to see if he would continue to be a Kevin Ryan patient or if he was establishing with a [facility] provider. He stated that he would stay with Dr. Clent Ryan for now. He also stated that he has not received his Rybelsus.  Sample place up front in cabinet . The patient is aware.    Copy   Loree Fee, [facility]  Pharmacist Specialty: Pharmacist   Progress Notes    Addendum   Creation Time: 07/10/2023  1:43 PM    Manufacturer Assistance Program (MAP) Application   Manufacturer: Novo Nordisk Medication(s): Rybelsus 7 mg Patient Portion of Application: Completed via online enrollment tool with assistance from [name] Medication Advocate team. Income Documentation: Online enrollment completed with patient.  and Electronic verification elected. No income documentation required at this time.   Provider Portion of Application:  Received form [name] Med Advocate Team via FAX 12/10: Provider page filled out and placed in PCP inbox for review/signature Prescription(s): Included in MAP application.   Next Steps:      PCP-signed form faxed to [name] team with confirmation.  Chart sent to [name] Team     Loree Fee, PharmD Clinical Pharmacist [facility] [phone]     Revision History .

## 2023-08-17 NOTE — Telephone Encounter (Signed)
Pt was given a 30 day supply in office today until he received his medication this month.  Medication Samples have been provided to the patient.  Drug name: Rybelsus        Strength: 7mg         Qty: 1 box of 30  LOT: [id number]  Exp.Date: 05/29/2024  Dosing instructions: Take 1 tablet (7 mg total) by mouth daily.   The patient has been instructed regarding the correct time, dose, and frequency of taking this medication, including desired effects and most common side effects.   Thurmond Butts 10:29 AM 08/17/2023

## 2023-08-22 ENCOUNTER — Ambulatory Visit: Payer: Medicare Other | Admitting: Family Medicine

## 2023-08-27 ENCOUNTER — Telehealth: Payer: Self-pay

## 2023-08-27 NOTE — Telephone Encounter (Signed)
Re-submitted On-line patient portion of Application for review to [employer].

## 2023-08-29 NOTE — Progress Notes (Signed)
Pharmacy Medication Assistance Program Note    08/29/2023  Patient ID: Kevin Ryan, male   DOB: 26-Apr-1951, 73 y.o.   MRN: [id number]     07/02/2023 07/06/2023  Outreach Medication One  Initial Outreach Date (Medication One) 06/27/2023 06/26/2023  Manufacturer Medication One [facility] Rybelsus Rybelsus  Dose of Rybelsus 7 mg daily 7 mg  Type of Forensic scientist Assistance  Date Application Sent to Patient  07/02/2023  Date Application Sent to Prescriber  07/02/2023  Name of Prescriber  Dana Allan  Date Application Received From Patient 07/02/2023   Patient Assistance Determination  Approved  Approval Start Date  08/01/2023  Approval End Date  07/30/2024  Patient Notification Method  MyChart     Signature

## 2023-09-04 NOTE — Progress Notes (Signed)
 Pharmacy Medication Assistance Program Note    09/04/2023  Patient ID: Kevin Ryan, male   DOB: [birth year], 73 y.o.   MRN: [id number]     07/02/2023 07/06/2023  Outreach Medication One  Initial Outreach Date (Medication One) 06/27/2023 06/26/2023  Manufacturer Medication One Novo Nordisk Novo Nordisk  [facility] Rybelsus  Rybelsus   Dose of Rybelsus  7 mg daily 7 mg  Type of Forensic Scientist Assistance  Date Application Sent to Patient  07/02/2023  Date Application Sent to Prescriber  07/02/2023  Name of Prescriber  Glenys Ferrari  Date Application Received From Patient 07/02/2023   Patient Assistance Determination  Approved  Approval Start Date  08/01/2023  Approval End Date  07/30/2024  Patient Notification Method  MyChart     Signature

## 2023-09-13 ENCOUNTER — Telehealth: Payer: Self-pay

## 2023-09-13 DIAGNOSIS — G4733 Obstructive sleep apnea (adult) (pediatric): Secondary | ICD-10-CM

## 2023-09-13 NOTE — Telephone Encounter (Signed)
Copied from CRM [id number]. Topic: General - Other >> Sep 13, 2023  2:56 PM Alcus Dad H wrote: Reason for CRM: Patient is needing another CPAP machine and wanted to know how and where to get one, he also mentioned wanting to know about the Earnest Bailey and if that would be better than the CPAP machine.

## 2023-09-18 NOTE — Telephone Encounter (Signed)
 Left message to return call to our office.

## 2023-09-19 ENCOUNTER — Telehealth: Payer: Self-pay | Admitting: Family Medicine

## 2023-09-19 ENCOUNTER — Telehealth: Payer: Self-pay

## 2023-09-19 NOTE — Telephone Encounter (Signed)
Spoke with pt and he stated that he would like to go forward with the referral to [facility] in [place].

## 2023-09-19 NOTE — Telephone Encounter (Signed)
I let pt know that pt Novo nordisk is behind on shipments right now so I would look at the office tomorrow or Friday when we return to see if we have any samples.

## 2023-09-19 NOTE — Telephone Encounter (Signed)
Copied from CRM [id number]. Topic: Clinical - Medication Question >> Sep 19, 2023 10:34 AM Almira Coaster wrote: Reason for CRM: Patient is calling regarding his Semaglutide (RYBELSUS) 7 MG TABS prescription. States he filled out the application for Novo about a month ago and he has not gotten an update. Patient would like to know if application was approved and if he is able to pick up the medication.

## 2023-09-19 NOTE — Telephone Encounter (Signed)
Patient has been trying to get his medication filled and sent over to the dr office to get his medication he is needing his medication asap he stated he has been waiting fir two months Semaglutide (RYBELSUS) 7 MG TABS  patient is needing to come get the medication from the office    if he goes to the pharmacy to get the medication it is 500 dollars he has taken his last pill today

## 2023-09-19 NOTE — Telephone Encounter (Signed)
 Referral has been placed.

## 2023-09-19 NOTE — Addendum Note (Signed)
Addended by: Sandy Salaam on: 09/19/2023 04:05 PM   Modules accepted: Orders

## 2023-09-20 ENCOUNTER — Other Ambulatory Visit: Payer: Self-pay

## 2023-09-20 ENCOUNTER — Other Ambulatory Visit: Payer: Self-pay | Admitting: Family Medicine

## 2023-09-20 DIAGNOSIS — E1159 Type 2 diabetes mellitus with other circulatory complications: Secondary | ICD-10-CM

## 2023-09-20 DIAGNOSIS — E1165 Type 2 diabetes mellitus with hyperglycemia: Secondary | ICD-10-CM

## 2023-09-20 MED ORDER — RYBELSUS 7 MG PO TABS: 7.0000 mg | ORAL_TABLET | Freq: Every day | ORAL | 0 refills | Status: DC

## 2023-09-20 NOTE — Telephone Encounter (Signed)
Called pt and spoke to his wife who is on DPR, and let her know the information. She stated that she understood.

## 2023-09-20 NOTE — Telephone Encounter (Signed)
Placed in folder for pick up

## 2023-09-20 NOTE — Telephone Encounter (Signed)
 See previous note

## 2023-10-10 ENCOUNTER — Telehealth: Payer: Self-pay

## 2023-10-10 NOTE — Telephone Encounter (Signed)
 Patient notified that Patient Assistance Medication are in the office & are ready for pick up. Pt states he has an appointment tomorrow 10/11/2023 and will pick up then     Medication:  Rybelsus 7mg    Quantity: 4   Lot# [id number]   Exp: 10/28/2024

## 2023-10-11 ENCOUNTER — Ambulatory Visit: Payer: Medicare Other | Admitting: Family Medicine

## 2023-10-11 ENCOUNTER — Encounter: Payer: Self-pay | Admitting: Family Medicine

## 2023-10-11 VITALS — BP 118/60 | HR 85 | Temp 98.3°F | Resp 20 | Ht 73.0 in | Wt 233.1 lb

## 2023-10-11 DIAGNOSIS — E785 Hyperlipidemia, unspecified: Secondary | ICD-10-CM

## 2023-10-11 DIAGNOSIS — E118 Type 2 diabetes mellitus with unspecified complications: Secondary | ICD-10-CM | POA: Diagnosis not present

## 2023-10-11 DIAGNOSIS — E1169 Type 2 diabetes mellitus with other specified complication: Secondary | ICD-10-CM

## 2023-10-11 DIAGNOSIS — I2510 Atherosclerotic heart disease of native coronary artery without angina pectoris: Secondary | ICD-10-CM

## 2023-10-11 DIAGNOSIS — E1159 Type 2 diabetes mellitus with other circulatory complications: Secondary | ICD-10-CM | POA: Diagnosis not present

## 2023-10-11 DIAGNOSIS — E1165 Type 2 diabetes mellitus with hyperglycemia: Secondary | ICD-10-CM

## 2023-10-11 DIAGNOSIS — L0291 Cutaneous abscess, unspecified: Secondary | ICD-10-CM | POA: Diagnosis not present

## 2023-10-11 DIAGNOSIS — I152 Hypertension secondary to endocrine disorders: Secondary | ICD-10-CM

## 2023-10-11 DIAGNOSIS — L989 Disorder of the skin and subcutaneous tissue, unspecified: Secondary | ICD-10-CM

## 2023-10-11 MED ORDER — EMPAGLIFLOZIN 25 MG PO TABS: 25.0000 mg | ORAL_TABLET | Freq: Every day | ORAL | 3 refills | Status: DC

## 2023-10-11 MED ORDER — ROSUVASTATIN CALCIUM 40 MG PO TABS: 40.0000 mg | ORAL_TABLET | Freq: Every day | ORAL | 3 refills | Status: DC

## 2023-10-11 MED ORDER — CLOPIDOGREL BISULFATE 75 MG PO TABS: 75.0000 mg | ORAL_TABLET | Freq: Every day | ORAL | 3 refills | Status: DC

## 2023-10-11 MED ORDER — EZETIMIBE 10 MG PO TABS: 10.0000 mg | ORAL_TABLET | Freq: Every day | ORAL | 3 refills | Status: DC

## 2023-10-11 MED ORDER — METFORMIN HCL ER 500 MG PO TB24: ORAL_TABLET | ORAL | 3 refills | Status: DC

## 2023-10-11 MED ORDER — METOPROLOL TARTRATE 50 MG PO TABS: 50.0000 mg | ORAL_TABLET | Freq: Two times a day (BID) | ORAL | 3 refills | Status: DC

## 2023-10-11 MED ORDER — FENOFIBRATE MICRONIZED 134 MG PO CAPS: 134.0000 mg | ORAL_CAPSULE | Freq: Every day | ORAL | 3 refills | Status: DC

## 2023-10-11 NOTE — Progress Notes (Signed)
 SUBJECTIVE:   Chief Complaint  Patient presents with   Diabetes    3 month follow up    HPI Presents for follow up chronic disease management  Discussed the use of AI scribe software for clinical note transcription with the patient, who gave verbal consent to proceed.  History of Present Illness The patient presents for follow-up of diabetes management and evaluation of a painful neck lesion.  Blood sugar levels are typically around 120-130 mg/dL, but last night, after eating, glucose spiked to over 400 mg/dL. He has been on Rybelsus 7 mg for about a year but could not tolerate an increase to 14 mg due to gastrointestinal side effects. He is unsure about the number of refills left for his medications.  He has a painful lesion on his neck that started about one to two weeks ago. It is painful with some drainage, but not pus. He suspects it may have started from an ingrown hair. Squeezing it results in blood and a red-colored fluid, but no pus. The lesion has a scab, and he has been shaving around it. No fever. The lesion has grown significantly over the past two weeks and is very sore, especially in the past few days.      PERTINENT PMH / PSH: As above  OBJECTIVE:  BP 118/60   Pulse 85   Temp 98.3 F (36.8 C)   Resp 20   Ht 6\' 1"  (1.854 m)   Wt 233 lb 2 oz (105.7 kg)   SpO2 97%   BMI 30.76 kg/m    Physical Exam Vitals reviewed.  Constitutional:      General: He is not in acute distress.    Appearance: Normal appearance. He is obese. He is not ill-appearing, toxic-appearing or diaphoretic.  Eyes:     General:        Right eye: No discharge.        Left eye: No discharge.  Cardiovascular:     Rate and Rhythm: Normal rate and regular rhythm.     Heart sounds: Normal heart sounds.  Pulmonary:     Effort: Pulmonary effort is normal.     Breath sounds: Normal breath sounds.  Musculoskeletal:        General: Normal range of motion.     Cervical back: Normal range of  motion.  Skin:    General: Skin is warm and dry.  Neurological:     Mental Status: He is alert and oriented to person, place, and time. Mental status is at baseline.  Psychiatric:        Mood and Affect: Mood normal.        Behavior: Behavior normal.        Thought Content: Thought content normal.        Judgment: Judgment normal.           10/11/2023    1:07 PM 07/13/2023   11:05 AM 03/21/2022   10:01 AM 10/07/2021   12:17 PM 07/19/2021    8:21 AM  Depression screen PHQ 2/9  Decreased Interest 0 0 0 0 0  Down, Depressed, Hopeless 0 0 0 0 0  PHQ - 2 Score 0 0 0 0 0  Altered sleeping 0 1     Tired, decreased energy 0 0     Change in appetite 0 0     Feeling bad or failure about yourself  0 0     Trouble concentrating 0 0     Moving slowly  or fidgety/restless 0 0     Suicidal thoughts 0 0     PHQ-9 Score 0 1     Difficult doing work/chores Not difficult at all Not difficult at all         10/11/2023    1:07 PM 07/13/2023   11:05 AM  GAD 7 : Generalized Anxiety Score  Nervous, Anxious, on Edge 0 0  Control/stop worrying 0 0  Worry too much - different things 0 0  Trouble relaxing 0 0  Restless 0 0  Easily annoyed or irritable 0 0  Afraid - awful might happen 0 0  Total GAD 7 Score 0 0  Anxiety Difficulty Not difficult at all Not difficult at all    ASSESSMENT/PLAN:  Type 2 diabetes mellitus with complications (HCC) Assessment & Plan: Suboptimal management with glucose levels reaching 400 mg/dL. Likely from recent skin abscess. Current medication Rybelsus 7 mg, intolerant to 14 mg dose. - Refill Rybelsus 7 mg daily - Refill Metformin XR 500 mg BID  Orders: -     Microalbumin / creatinine urine ratio -     Comprehensive metabolic panel -     Empagliflozin; Take 1 tablet (25 mg total) by mouth daily before breakfast.  Dispense: 90 tablet; Refill: 3 -     metFORMIN HCl ER; TAKE TWO TABLETS BY MOUTH IN THE MORNINGAND AT BEDTIME  Dispense: 360 tablet; Refill:  3  Hypertension associated with diabetes (HCC) Assessment & Plan: Refill Metoprolol Refill Zestril  Orders: -     Empagliflozin; Take 1 tablet (25 mg total) by mouth daily before breakfast.  Dispense: 90 tablet; Refill: 3 -     Metoprolol Tartrate; Take 1 tablet (50 mg total) by mouth 2 (two) times daily.  Dispense: 180 tablet; Refill: 3 -     metFORMIN HCl ER; TAKE TWO TABLETS BY MOUTH IN THE MORNINGAND AT BEDTIME  Dispense: 360 tablet; Refill: 3 -     Lisinopril; Take 1 tablet (2.5 mg total) by mouth daily.  Dispense: 90 tablet; Refill: 3  Coronary artery disease involving native coronary artery of native heart without angina pectoris -     Clopidogrel Bisulfate; Take 1 tablet (75 mg total) by mouth daily.  Dispense: 90 tablet; Refill: 3  Abscess Assessment & Plan: Suspected abscess likely from ingrown hair, increasing in size with drainage.Would benefit from I&D. Afebrile.  Has decreased slightly. - Refer to urgent care for incision and drainage (I&D) given area and on antiplatelets. Declined antibiotics, plans to have daughter, who works in health care use needle to puncture at home and start Amoxicillin that was previously prescribed.  Discussed with patient that this would likely increase risk of infection.  Warm compresses frequently to allow for drainage -Consider ultrasound to confirm abscess vs lymphadenopathy.  Patient declined imaging at this time. -Check CBC -If no improvement return to clinic  Orders: -     CBC with Differential/Platelet  Hyperlipidemia associated with type 2 diabetes mellitus (HCC) Assessment & Plan: Refill Zetia Refill Fenofibrate Refill Crestor  Orders: -     Ezetimibe; Take 1 tablet (10 mg total) by mouth daily.  Dispense: 90 tablet; Refill: 3 -     Fenofibrate Micronized; Take 1 capsule (134 mg total) by mouth daily before breakfast.  Dispense: 90 capsule; Refill: 3 -     Rosuvastatin Calcium; Take 1 tablet (40 mg total) by mouth at bedtime.   Dispense: 90 tablet; Refill: 3    PDMP reviewed  Return in about  6 months (around 04/12/2024), or if symptoms worsen or fail to improve, for PCP.  Dana Allan, MD

## 2023-10-11 NOTE — Patient Instructions (Addendum)
 It was a pleasure meeting you today. Thank you for allowing me to take part in your health care.  Our goals for today as we discussed include:  Recommend incision and drainage of neck abscess at urgent care given that you have increased risk of bleeding Warm compresses to neck to help drainage Recommend starting antibiotics  Will check blood work today  Continue all other medications Refills sent for requested medications   This is a list of the screening recommended for you and due dates:  Health Maintenance  Topic Date Due   Screening for Lung Cancer  10/23/2020   Complete foot exam   01/26/2021   Medicare Annual Wellness Visit  08/10/2021   Yearly kidney health urinalysis for diabetes  09/22/2023   Flu Shot  10/29/2023*   Hemoglobin A1C  01/11/2024   Eye exam for diabetics  04/19/2024   Yearly kidney function blood test for diabetes  07/12/2024   Colon Cancer Screening  09/08/2031   DTaP/Tdap/Td vaccine (2 - Td or Tdap) 10/15/2031   Pneumonia Vaccine  Completed   Hepatitis C Screening  Completed   Zoster (Shingles) Vaccine  Completed   HPV Vaccine  Aged Out   COVID-19 Vaccine  Discontinued  *Topic was postponed. The date shown is not the original due date.      If you have any questions or concerns, please do not hesitate to call the office at [phone].  I look forward to our next visit and until then take care and stay safe.  Regards,   Dana Allan, MD   [facility]

## 2023-10-11 NOTE — Telephone Encounter (Signed)
 Pt picked of medication at office visit.

## 2023-10-12 LAB — MICROALBUMIN / CREATININE URINE RATIO
Creatinine,U: 70.5 mg/dL
Microalb Creat Ratio: 18.4 mg/g (ref 0.0–30.0)
Microalb, Ur: 1.3 mg/dL (ref 0.0–1.9)

## 2023-10-12 LAB — CBC WITH DIFFERENTIAL/PLATELET
Basophils Absolute: 0.1 10*3/uL (ref 0.0–0.1)
Basophils Relative: 0.8 % (ref 0.0–3.0)
Eosinophils Absolute: 0.3 10*3/uL (ref 0.0–0.7)
Eosinophils Relative: 3.2 % (ref 0.0–5.0)
HCT: 45.9 % (ref 39.0–52.0)
Hemoglobin: 15.4 g/dL (ref 13.0–17.0)
Lymphocytes Relative: 28.5 % (ref 12.0–46.0)
Lymphs Abs: 3 10*3/uL (ref 0.7–4.0)
MCHC: 33.5 g/dL (ref 30.0–36.0)
MCV: 97.8 fl (ref 78.0–100.0)
Monocytes Absolute: 0.6 10*3/uL (ref 0.1–1.0)
Monocytes Relative: 5.7 % (ref 3.0–12.0)
Neutro Abs: 6.4 10*3/uL (ref 1.4–7.7)
Neutrophils Relative %: 61.8 % (ref 43.0–77.0)
Platelets: 162 10*3/uL (ref 150.0–400.0)
RBC: 4.7 Mil/uL (ref 4.22–5.81)
RDW: 13.8 % (ref 11.5–15.5)
WBC: 10.4 10*3/uL (ref 4.0–10.5)

## 2023-10-12 LAB — COMPREHENSIVE METABOLIC PANEL
ALT: 16 U/L (ref 0–53)
AST: 21 U/L (ref 0–37)
Albumin: 4.7 g/dL (ref 3.5–5.2)
Alkaline Phosphatase: 59 U/L (ref 39–117)
BUN: 21 mg/dL (ref 6–23)
CO2: 23 meq/L (ref 19–32)
Calcium: 9.8 mg/dL (ref 8.4–10.5)
Chloride: 100 meq/L (ref 96–112)
Creatinine, Ser: 1.04 mg/dL (ref 0.40–1.50)
GFR: 71.39 mL/min (ref >60.00)
Glucose, Bld: 169 mg/dL — ABNORMAL HIGH (ref 70–99)
Potassium: 4.4 meq/L (ref 3.5–5.1)
Sodium: 136 meq/L (ref 135–145)
Total Bilirubin: 0.6 mg/dL (ref 0.2–1.2)
Total Protein: 7.9 g/dL (ref 6.0–8.3)

## 2023-10-16 ENCOUNTER — Encounter: Payer: Self-pay | Admitting: Family Medicine

## 2023-10-16 DIAGNOSIS — L0291 Cutaneous abscess, unspecified: Secondary | ICD-10-CM | POA: Insufficient documentation

## 2023-10-16 MED ORDER — LISINOPRIL 2.5 MG PO TABS: 2.5000 mg | ORAL_TABLET | Freq: Every day | ORAL | 3 refills | Status: DC

## 2023-10-16 NOTE — Assessment & Plan Note (Addendum)
 Suspected abscess likely from ingrown hair, increasing in size with drainage.Would benefit from I&D. Afebrile.  Has decreased slightly. - Refer to urgent care for incision and drainage (I&D) given area and on antiplatelets. Declined antibiotics, plans to have daughter, who works in health care use needle to puncture at home and start Amoxicillin that was previously prescribed.  Discussed with patient that this would likely increase risk of infection.  Warm compresses frequently to allow for drainage -Consider ultrasound to confirm abscess vs lymphadenopathy.  Patient declined imaging at this time. -Check CBC -If no improvement return to clinic

## 2023-10-16 NOTE — Assessment & Plan Note (Signed)
 Refill Metoprolol Refill Zestril

## 2023-10-16 NOTE — Assessment & Plan Note (Addendum)
 Suboptimal management with glucose levels reaching 400 mg/dL. Likely from recent skin abscess. Current medication Rybelsus 7 mg, intolerant to 14 mg dose. - Refill Rybelsus 7 mg daily - Refill Metformin XR 500 mg BID

## 2023-10-16 NOTE — Assessment & Plan Note (Signed)
 Refill Zetia Refill Fenofibrate Refill Crestor

## 2023-11-19 ENCOUNTER — Telehealth: Payer: Self-pay

## 2023-11-19 NOTE — Telephone Encounter (Signed)
 Copied from CRM [id number]. Topic: Referral - Status >> Nov 19, 2023  9:31 AM Roseanne Cones wrote: Reason for CRM: Patient scheduled to see [facility] - Referral was closed in Epic - Can the referral be re-openned? They have an appointment on 05/01 with Girard Lam, NP for New Patient Appointment regarding the OSA and CPAP.

## 2023-11-19 NOTE — Telephone Encounter (Signed)
 Copied from CRM [id number]. Topic: Referral - Question >> Nov 16, 2023 10:25 AM Star East wrote: Reason for CRM: question about scheduling a sleep study- needs to know if he needs a referral- also needs new machine-  please call wife Diane [phone]

## 2023-11-19 NOTE — Telephone Encounter (Signed)
 Called and gave pt wife the information to call and get him an appointment scheduled.

## 2023-11-29 ENCOUNTER — Encounter: Payer: Self-pay | Admitting: Nurse Practitioner

## 2023-11-29 ENCOUNTER — Ambulatory Visit: Payer: PRIVATE HEALTH INSURANCE | Admitting: Nurse Practitioner

## 2023-11-29 VITALS — BP 106/80 | HR 97 | Temp 97.7°F | Ht 73.0 in | Wt 232.0 lb

## 2023-11-29 DIAGNOSIS — G4733 Obstructive sleep apnea (adult) (pediatric): Secondary | ICD-10-CM | POA: Diagnosis not present

## 2023-11-29 DIAGNOSIS — R911 Solitary pulmonary nodule: Secondary | ICD-10-CM

## 2023-11-29 DIAGNOSIS — F17210 Nicotine dependence, cigarettes, uncomplicated: Secondary | ICD-10-CM | POA: Diagnosis not present

## 2023-11-29 NOTE — Patient Instructions (Addendum)
 Continue to use CPAP every night, minimum of 4-6 hours a night.  Change equipment as directed. Wash your tubing with warm soap and water daily, hang to dry. Wash humidifier portion weekly. Use bottled, distilled water and change daily Be aware of reduced alertness and do not drive or operate heavy machinery if experiencing this or drowsiness.  Exercise encouraged, as tolerated. Healthy weight management discussed.  Avoid or decrease alcohol consumption and medications that make you more sleepy, if possible. Notify if persistent daytime sleepiness occurs even with consistent use of PAP therapy.  Orders placed for new CPAP machine auto 10-20 cmH2O If you haven't heard something in the next 2-3 weeks, let me know so we can follow up on this  Recommend enrolling in the lung cancer screening program; think about it and let me know at your follow up. This is covered by insurance and can help catch lung cancer as earlier stages   Follow up in 12 weeks with Katie Talayla Doyel,NP, or sooner, if needed

## 2023-11-29 NOTE — Progress Notes (Unsigned)
 @Patient  ID: Kevin Ryan, male    DOB: [birth year], 73 y.o.   MRN: [id number]  Chief Complaint  Patient presents with   Consult    Wearing CPAP nightly. No problems with mask or pressure.     Referring provider: Valli Gaw, MD  HPI: 73 year old male, some day smoker referred for sleep consult. Past medical history significant for OSA, CAD, HTN, DM, hepatic steatosis, HLD.   TEST/EVENTS:  07/03/2018 HST: AHI 25.8/h, SpO2 low 80%  11/29/2023: Today - sleep consult Discussed the use of AI scribe software for clinical note transcription with the patient, who gave verbal consent to proceed.  History of Present Illness   Kevin Ryan is a 73 year old male with moderate sleep apnea who presents to re-establish care regarding his OSA and CPAP machine.  He uses a CPAP machine for his moderate sleep apnea, but the current machine is malfunctioning, producing an unusual noise. He previously used a Environmental education officer, which he believes contributed to the issue, and has since discontinued its use. He perceives the CPAP pressure as weaker than before. Despite these issues, he reports adequate sleep quality with no excessive tiredness or episodes of falling asleep while driving. He is wanting to get a new machine. No snoring with CPAP. Denies drowsy driving or morning headaches.  He does wake up multiple times a night, which seemed to start with the CPAP making an odd noise.  Prior to this, he was sleeping through the night for the most part.  Did sometimes get up to use the restroom.  Goes to bed around 11 PM to 2 AM.  Falls asleep within 10 minutes.  Gets up around 8 AM.  He is retired.  Weight is down 35 pounds over the last 2 years. No issues with bloating or belching with CPAP.   He is a active smoker but does not smoke every day.  Has 25-pack-year history.  No excessive alcohol or caffeine intake.  Lives with his wife.  He has a history of smoking and has previously undergone lung cancer  screening CT scans, with the last one in 2021. He is reluctant to continue these screenings, citing a past diagnosis of stomach cancer in 2017, which was treated successfully. He is planning a beach trip with friends to play golf and is concerned about receiving his new CPAP machine before the trip.     [facility] 5  10/28/2023-11/26/2023 CPAP 10-20 cmH2O 29/30 days; 93% >4 hr; average use 7 hr 18 min Pressure 95th 14.5 Leaks 95th 0.1 AHI 0.5   Allergies  Allergen Reactions   Ibuprofen Itching and Rash    Immunization History  Administered Date(s) Administered   Fluad Quad(high Dose 65+) 06/09/2021   Influenza-Unspecified 05/01/2019   Pneumococcal Conjugate-13 05/23/2016   Pneumococcal Polysaccharide-23 06/17/2019   Tdap 10/14/2021   Zoster Recombinant(Shingrix ) 05/26/2022, 08/24/2022   Zoster, Live 02/13/2017    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   CAD (coronary artery disease)    s/p MI   CAD (coronary artery disease)    s/p stent x 1 2004    CAD (coronary artery disease) 03/25/2018   Cancer (HCC)    spindle cell stomach    Cervical spondylosis with myelopathy 11/21/2017   CTS (carpal tunnel syndrome) 03/28/2018   Diabetes mellitus without complication (HCC)  oral meds, Type 2   Elevated TSH 03/28/2018   Erectile dysfunction 04/26/2022   Excessive cerumen in both ear canals 05/16/2018   Gastric mass 04/12/2015   cancer; resected   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 We switched to fenofibrate  in June. He should be taking rosuvastatin  40 mg + ezetimibe  10 mg + fenofibrate  134 mg daily. 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    Myocardial infarction [facility]) 2004   Personal history of tobacco use, presenting hazards to health 07/13/2015   Pneumonia    Shoulder pain, right 01/27/2020   Dr. 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   Vertigo    none in approx 1 yr   Vertigo 09/17/2018   Viral upper respiratory tract infection 07/19/2021   Wears dentures    partial upper   Wears hearing aid in both ears     Tobacco History: Social History   Tobacco Use  Smoking Status Some Days   Current packs/day: 0.50   Average packs/day: 0.5 packs/day for 50.0 years (25.0 ttl pk-yrs)   Types: Cigarettes  Smokeless Tobacco Former   Quit date: 06/16/1969  Tobacco Comments   1 ppd or a little less (since age 64)   Ready to quit: Not Answered Counseling given: Not Answered Tobacco comments: 1 ppd or a  little less (since age 6)   Outpatient Medications Prior to Visit  Medication Sig Dispense Refill   acetaminophen  (TYLENOL ) 500 MG tablet Take 1,000 mg by mouth in the morning and at bedtime.     aspirin  81 MG tablet Take 81 mg by mouth daily.     clopidogrel  (PLAVIX ) 75 MG tablet Take 1 tablet (75 mg total) by mouth daily. 90 tablet 3   ezetimibe  (ZETIA ) 10 MG tablet Take 1 tablet (10 mg total) by mouth daily. 90 tablet 3   fenofibrate  micronized (LOFIBRA) 134 MG capsule Take 1 capsule (134 mg total) by mouth daily before breakfast. 90 capsule 3   lisinopril  (ZESTRIL ) 2.5 MG tablet Take 1 tablet (2.5 mg total) by  mouth daily. 90 tablet 3   MAGNESIUM PO Take by mouth daily.     metFORMIN  (GLUCOPHAGE -XR) 500 MG 24 hr tablet TAKE TWO TABLETS BY MOUTH IN THE MORNINGAND AT BEDTIME 360 tablet 3   metoprolol  tartrate (LOPRESSOR ) 50 MG tablet Take 1 tablet (50 mg total) by mouth 2 (two) times daily. 180 tablet 3   Probiotic Product (PROBIOTIC DAILY PO) Take by mouth.     rosuvastatin  (CRESTOR ) 40 MG tablet Take 1 tablet (40 mg total) by mouth at bedtime. 90 tablet 3   Semaglutide  (RYBELSUS ) 7 MG TABS Take 1 tablet (7 mg total) by mouth daily. Pt asst program 30 tablet 0   vitamin B-12 (CYANOCOBALAMIN ) 500 MCG tablet Take 5,000 mcg by mouth daily.      Vitamin D , Ergocalciferol , (DRISDOL ) 1.25 MG (50000 UNIT) CAPS capsule Take 1 capsule (50,000 Units total) by mouth every 7 (seven) days. 30 capsule 1   empagliflozin  (JARDIANCE ) 25 MG TABS tablet Take 1 tablet (25 mg total) by mouth daily before breakfast. (Patient not taking: Reported on 11/29/2023) 90 tablet 3   mupirocin  ointment (BACTROBAN ) 2 % Apply 1 Application topically 2 (two) times daily. Prn wounds (Patient not taking: Reported on 11/29/2023) 30 g 0   sildenafil  (REVATIO ) 20 MG tablet Take 1-5 tablets (20-100 mg total) by mouth daily. Up to 4 hours before intercourse (Patient not taking: Reported on 11/29/2023) 90 tablet 0   sucralfate  (CARAFATE )  1 g tablet Take 1 tablet (1 g total) by mouth 4 (four) times daily. (Patient not taking: Reported on 11/29/2023) 120 tablet 1   No facility-administered medications prior to visit.     Review of Systems:   Constitutional: No weight loss or gain, night sweats, fevers, chills, fatigue, or lassitude. HEENT: No headaches, difficulty swallowing, tooth/dental problems, or sore throat. No sneezing, itching, ear ache, nasal congestion, or post nasal drip CV:  No chest pain, orthopnea, PND, swelling in lower extremities, anasarca, dizziness, palpitations, syncope Resp: No shortness of breath with exertion or at rest. No cough GI:  No heartburn, indigestion GU: No nocturia  Skin: No rash, lesions, ulcerations MSK:  No joint pain or swelling.   Neuro: No dizziness or lightheadedness.  Psych: No depression or anxiety. Mood stable. +sleep disturbance    Physical Exam:  BP 106/80 (BP Location: Left Arm, Patient Position: Sitting, Cuff Size: Normal)   Pulse 97   Temp 97.7 F (36.5 C) (Temporal)   Ht 6\' 1"  (1.854 m)   Wt 232 lb (105.2 kg)   SpO2 96%   BMI 30.61 kg/m   GEN: Pleasant, interactive, well-appearing; in no acute distress HEENT:  Normocephalic and atraumatic. PERRLA. Sclera white. Nasal turbinates pink, moist and patent bilaterally. No rhinorrhea present. Oropharynx pink and moist, without exudate or edema. No lesions, ulcerations, or postnasal drip. Mallampati III NECK:  Supple w/ fair ROM. Thyroid  symmetrical with no goiter or nodules palpated. No lymphadenopathy.   CV: RRR, no m/r/g, no peripheral edema. Pulses intact, +2 bilaterally. No cyanosis, pallor or clubbing. PULMONARY:  Unlabored, regular breathing. Clear bilaterally A&P w/o wheezes/rales/rhonchi. No accessory muscle use.  GI: BS present and normoactive. Soft, non-tender to palpation. No organomegaly or masses detected.  MSK: No erythema, warmth or tenderness. Cap refil <2 sec all extrem.  Neuro: A/Ox3. No focal deficits  noted.   Skin: Warm, no lesions or rashe Psych: Normal affect and behavior. Judgement and thought content appropriate.     Lab Results:  CBC    Component Value  Date/Time   WBC 10.4 10/11/2023 1350   RBC 4.70 10/11/2023 1350   HGB 15.4 10/11/2023 1350   HGB 14.6 01/01/2014 1712   HCT 45.9 10/11/2023 1350   HCT 43.9 01/01/2014 1712   PLT 162.0 10/11/2023 1350   PLT 147 (L) 01/01/2014 1712   MCV 97.8 10/11/2023 1350   MCV 96 01/01/2014 1712   MCH 32.2 08/03/2023 1425   MCHC 33.5 10/11/2023 1350   RDW 13.8 10/11/2023 1350   RDW 13.2 01/01/2014 1712   LYMPHSABS 3.0 10/11/2023 1350   MONOABS 0.6 10/11/2023 1350   EOSABS 0.3 10/11/2023 1350   BASOSABS 0.1 10/11/2023 1350    BMET    Component Value Date/Time   NA 136 10/11/2023 1350   K 4.4 10/11/2023 1350   CL 100 10/11/2023 1350   CO2 23 10/11/2023 1350   GLUCOSE 169 (H) 10/11/2023 1350   BUN 21 10/11/2023 1350   CREATININE 1.04 10/11/2023 1350   CREATININE 1.01 05/16/2018 0800   CALCIUM  9.8 10/11/2023 1350   GFRNONAA >60 07/08/2023 1453   GFRAA >60 01/30/2017 0409    BNP No results found for: "BNP"   Imaging:  No results found.  Administration History     None           No data to display          No results found for: "NITRICOXIDE"      Assessment & Plan:   OSA on CPAP Moderate OSA on CPAP. Excellent compliance and control. Receives benefit from use. Aware of proper care/use of device. Due for a new machine. Encouraged to avoid SoClean machine - verbalized understanding. Orders placed for new machine today. Healthy weight loss encouraged. Safe driving practices reviewed.   Patient Instructions  Continue to use CPAP every night, minimum of 4-6 hours a night.  Change equipment as directed. Wash your tubing with warm soap and water daily, hang to dry. Wash humidifier portion weekly. Use bottled, distilled water and change daily Be aware of reduced alertness and do not drive or operate heavy  machinery if experiencing this or drowsiness.  Exercise encouraged, as tolerated. Healthy weight management discussed.  Avoid or decrease alcohol consumption and medications that make you more sleepy, if possible. Notify if persistent daytime sleepiness occurs even with consistent use of PAP therapy.  Orders placed for new CPAP machine auto 10-20 cmH2O If you haven't heard something in the next 2-3 weeks, let me know so we can follow up on this  Recommend enrolling in the lung cancer screening program; think about it and let me know at your follow up. This is covered by insurance and can help catch lung cancer as earlier stages   Follow up in 12 weeks with Alston Jerry Ki Corbo,NP, or sooner, if needed    Lung nodule Scattered small nodules, Lung RADS 2 on prior CT imaging 2021. He is an active smoker. Smoking cessation advised - no desire to quit at this time. Encouraged to re-enroll in lung cancer screening program and educated on importance. He will consider this and notify us  at his follow up if he's willing to re-enroll.   Advised if symptoms do not improve or worsen, to please contact office for sooner follow up or seek emergency care.   I spent 45 minutes of dedicated to the care of this patient on the date of this encounter to include pre-visit review of records, face-to-face time with the patient discussing conditions above, post visit ordering of testing, clinical documentation with  the electronic health record, making appropriate referrals as documented, and communicating necessary findings to members of the patients care team.  Roetta Clarke, NP 12/03/2023  Pt aware and understands NP's role.

## 2023-12-03 ENCOUNTER — Encounter: Payer: Self-pay | Admitting: Nurse Practitioner

## 2023-12-03 NOTE — Assessment & Plan Note (Signed)
 Moderate OSA on CPAP. Excellent compliance and control. Receives benefit from use. Aware of proper care/use of device. Due for a new machine. Encouraged to avoid SoClean machine - verbalized understanding. Orders placed for new machine today. Healthy weight loss encouraged. Safe driving practices reviewed.   Patient Instructions  Continue to use CPAP every night, minimum of 4-6 hours a night.  Change equipment as directed. Wash your tubing with warm soap and water daily, hang to dry. Wash humidifier portion weekly. Use bottled, distilled water and change daily Be aware of reduced alertness and do not drive or operate heavy machinery if experiencing this or drowsiness.  Exercise encouraged, as tolerated. Healthy weight management discussed.  Avoid or decrease alcohol consumption and medications that make you more sleepy, if possible. Notify if persistent daytime sleepiness occurs even with consistent use of PAP therapy.  Orders placed for new CPAP machine auto 10-20 cmH2O If you haven't heard something in the next 2-3 weeks, let me know so we can follow up on this  Recommend enrolling in the lung cancer screening program; think about it and let me know at your follow up. This is covered by insurance and can help catch lung cancer as earlier stages   Follow up in 12 weeks with Katie Sumiya Mamaril,NP, or sooner, if needed

## 2023-12-03 NOTE — Assessment & Plan Note (Signed)
 Scattered small nodules, Lung RADS 2 on prior CT imaging 2021. He is an active smoker. Smoking cessation advised - no desire to quit at this time. Encouraged to re-enroll in lung cancer screening program and educated on importance. He will consider this and notify us  at his follow up if he's willing to re-enroll.

## 2023-12-10 ENCOUNTER — Telehealth: Payer: Self-pay | Admitting: Nurse Practitioner

## 2023-12-10 NOTE — Telephone Encounter (Signed)
 Rc'd CMN from [facility] req a signature. Put in Ms. Cobb's box.

## 2023-12-21 NOTE — Telephone Encounter (Signed)
 CMN faxed successfully and signed.

## 2024-01-15 ENCOUNTER — Telehealth: Payer: Self-pay | Admitting: *Deleted

## 2024-01-15 NOTE — Telephone Encounter (Signed)
 Patient notified that Patient Assistance Medication are in the office & are ready for pick up.   Medication: Rybelsus  7mg   Quantity: 4 boxes  Lot# [id number]  Exp: 03/30/2025

## 2024-03-05 ENCOUNTER — Ambulatory Visit: Payer: Self-pay | Admitting: *Deleted

## 2024-03-05 ENCOUNTER — Telehealth: Admitting: Physician Assistant

## 2024-03-05 DIAGNOSIS — U071 COVID-19: Secondary | ICD-10-CM

## 2024-03-05 MED ORDER — MOLNUPIRAVIR EUA 200MG CAPSULE: 4.0000 | ORAL_CAPSULE | Freq: Two times a day (BID) | ORAL | 0 refills | Status: AC

## 2024-03-05 MED ORDER — NIRMATRELVIR/RITONAVIR (PAXLOVID)TABLET: 3.0000 | ORAL_TABLET | Freq: Two times a day (BID) | ORAL | 0 refills | Status: AC

## 2024-03-05 NOTE — Patient Instructions (Addendum)
 Kevin Ryan, thank you for joining Delon CHRISTELLA Dickinson, PA-C for today's virtual visit.  While this provider is not your primary care provider (PCP), if your PCP is located in our provider database this encounter information will be shared with them immediately following your visit.   A [facility] MyChart account gives you access to today's visit and all your visits, tests, and labs performed at [facility]  click here if you don't have a [name] or go to [email or web address]  Consent: (Patient) Kevin Ryan provided verbal consent for this virtual visit at the beginning of the encounter.  Current Medications:  Current Outpatient Medications:    molnupiravir  EUA (LAGEVRIO ) 200 mg CAPS capsule, Take 4 capsules (800 mg total) by mouth 2 (two) times daily for 5 days., Disp: 40 capsule, Rfl: 0   nirmatrelvir /ritonavir  (PAXLOVID ) 20 x 150 MG & 10 x 100MG  TABS, Take 3 tablets by mouth 2 (two) times daily for 5 days. (Take nirmatrelvir  150 mg two tablets twice daily for 5 days and ritonavir  100 mg one tablet twice daily for 5 days) Patient GFR is 71.39, Disp: 30 tablet, Rfl: 0   acetaminophen  (TYLENOL ) 500 MG tablet, Take 1,000 mg by mouth in the morning and at bedtime., Disp: , Rfl:    aspirin  81 MG tablet, Take 81 mg by mouth daily., Disp: , Rfl:    clopidogrel  (PLAVIX ) 75 MG tablet, Take 1 tablet (75 mg total) by mouth daily., Disp: 90 tablet, Rfl: 3   empagliflozin  (JARDIANCE ) 25 MG TABS tablet, Take 1 tablet (25 mg total) by mouth daily before breakfast. (Patient not taking: Reported on 11/29/2023), Disp: 90 tablet, Rfl: 3   ezetimibe  (ZETIA ) 10 MG tablet, Take 1 tablet (10 mg total) by mouth daily., Disp: 90 tablet, Rfl: 3   fenofibrate  micronized (LOFIBRA) 134 MG capsule, Take 1 capsule (134 mg total) by mouth daily before breakfast., Disp: 90 capsule, Rfl: 3   lisinopril  (ZESTRIL ) 2.5 MG tablet, Take 1 tablet (2.5 mg total) by mouth daily., Disp: 90  tablet, Rfl: 3   MAGNESIUM PO, Take by mouth daily., Disp: , Rfl:    metFORMIN  (GLUCOPHAGE -XR) 500 MG 24 hr tablet, TAKE TWO TABLETS BY MOUTH IN THE MORNINGAND AT BEDTIME, Disp: 360 tablet, Rfl: 3   metoprolol  tartrate (LOPRESSOR ) 50 MG tablet, Take 1 tablet (50 mg total) by mouth 2 (two) times daily., Disp: 180 tablet, Rfl: 3   mupirocin  ointment (BACTROBAN ) 2 %, Apply 1 Application topically 2 (two) times daily. Prn wounds (Patient not taking: Reported on 11/29/2023), Disp: 30 g, Rfl: 0   Probiotic Product (PROBIOTIC DAILY PO), Take by mouth., Disp: , Rfl:    rosuvastatin  (CRESTOR ) 40 MG tablet, Take 1 tablet (40 mg total) by mouth at bedtime., Disp: 90 tablet, Rfl: 3   Semaglutide  (RYBELSUS ) 7 MG TABS, Take 1 tablet (7 mg total) by mouth daily. Pt asst program, Disp: 30 tablet, Rfl: 0   sildenafil  (REVATIO ) 20 MG tablet, Take 1-5 tablets (20-100 mg total) by mouth daily. Up to 4 hours before intercourse (Patient not taking: Reported on 11/29/2023), Disp: 90 tablet, Rfl: 0   sucralfate  (CARAFATE ) 1 g tablet, Take 1 tablet (1 g total) by mouth 4 (four) times daily. (Patient not taking: Reported on 11/29/2023), Disp: 120 tablet, Rfl: 1   vitamin B-12 (CYANOCOBALAMIN ) 500 MCG tablet, Take 5,000 mcg by mouth daily. , Disp: , Rfl:    Vitamin D , Ergocalciferol , (DRISDOL ) 1.25 MG (50000 UNIT) CAPS capsule, Take 1  capsule (50,000 Units total) by mouth every 7 (seven) days., Disp: 30 capsule, Rfl: 1   Medications ordered in this encounter:  Meds ordered this encounter  Medications   nirmatrelvir /ritonavir  (PAXLOVID ) 20 x 150 MG & 10 x 100MG  TABS    Sig: Take 3 tablets by mouth 2 (two) times daily for 5 days. (Take nirmatrelvir  150 mg two tablets twice daily for 5 days and ritonavir  100 mg one tablet twice daily for 5 days) Patient GFR is 71.39    Dispense:  30 tablet    Refill:  0    Supervising Provider:   BLAISE ALEENE KIDD [[id number]]   molnupiravir  EUA (LAGEVRIO ) 200 mg CAPS capsule    Sig: Take 4 capsules  (800 mg total) by mouth 2 (two) times daily for 5 days.    Dispense:  40 capsule    Refill:  0    Supervising Provider:   BLAISE ALEENE KIDD [[id number]]     *If you need refills on other medications prior to your next appointment, please contact your pharmacy*  Follow-Up: Call back or seek an in-person evaluation if the symptoms worsen or if the condition fails to improve as anticipated.  [facility] [phone]  Care Instructions: HOLD Rosuvastatin  (Crestor )  Can take to lessen severity: Vit C 500mg  twice daily Quercertin 250-500mg  twice daily Zinc  75-100mg  daily Melatonin 3-6 mg at bedtime Vit D3 1000-2000 IU daily Optional: Famotidine 20mg  daily Also can add tylenol /ibuprofen as needed for fevers and body aches May add Mucinex or Mucinex DM as needed for cough/congestion    Isolation Instructions: You are to isolate at home until you have been fever free for at least 24 hours without a fever-reducing medication, and symptoms have been steadily improving for 24 hours. At that time,  you can end isolation but need to mask for an additional 5 days.   If you must be around other household members who do not have symptoms, you need to make sure that both you and the family members are masking consistently with a high-quality mask.  If you note any worsening of symptoms despite treatment, please seek an in-person evaluation ASAP. If you note any significant shortness of breath or any chest pain, please seek ER evaluation. Please do not delay care!   COVID-19: What to Do if You Are Sick If you test positive and are an older adult or someone who is at high risk of getting very sick from COVID-19, treatment may be available. Contact a healthcare provider right away after a positive test to determine if you are eligible, even if your symptoms are mild right now. You can also visit a Test to Treat location and, if eligible, receive a prescription from a provider. Don't delay:  Treatment must be started within the first few days to be effective. If you have a fever, cough, or other symptoms, you might have COVID-19. Most people have mild illness and are able to recover at home. If you are sick: Keep track of your symptoms. If you have an emergency warning sign (including trouble breathing), call 911. Steps to help prevent the spread of COVID-19 if you are sick If you are sick with COVID-19 or think you might have COVID-19, follow the steps below to care for yourself and to help protect other people in your home and community. Stay home except to get medical care Stay home. Most people with COVID-19 have mild illness and can recover at home without medical care. Do not  leave your home, except to get medical care. Do not visit public areas and do not go to places where you are unable to wear a mask. Take care of yourself. Get rest and stay hydrated. Take over-the-counter medicines, such as acetaminophen , to help you feel better. Stay in touch with your doctor. Call before you get medical care. Be sure to get care if you have trouble breathing, or have any other emergency warning signs, or if you think it is an emergency. Avoid public transportation, ride-sharing, or taxis if possible. Get tested If you have symptoms of COVID-19, get tested. While waiting for test results, stay away from others, including staying apart from those living in your household. Get tested as soon as possible after your symptoms start. Treatments may be available for people with COVID-19 who are at risk for becoming very sick. Don't delay: Treatment must be started early to be effective--some treatments must begin within 5 days of your first symptoms. Contact your healthcare provider right away if your test result is positive to determine if you are eligible. Self-tests are one of several options for testing for the virus that causes COVID-19 and may be more convenient than laboratory-based tests and  point-of-care tests. Ask your healthcare provider or your local health department if you need help interpreting your test results. You can visit your state, tribal, local, and territorial health department's website to look for the latest local information on testing sites. Separate yourself from other people As much as possible, stay in a specific room and away from other people and pets in your home. If possible, you should use a separate bathroom. If you need to be around other people or animals in or outside of the home, wear a well-fitting mask. Tell your close contacts that they may have been exposed to COVID-19. An infected person can spread COVID-19 starting 48 hours (or 2 days) before the person has any symptoms or tests positive. By letting your close contacts know they may have been exposed to COVID-19, you are helping to protect everyone. See COVID-19 and Animals if you have questions about pets. If you are diagnosed with COVID-19, someone from the health department may call you. Answer the call to slow the spread. Monitor your symptoms Symptoms of COVID-19 include fever, cough, or other symptoms. Follow care instructions from your healthcare provider and local health department. Your local health authorities may give instructions on checking your symptoms and reporting information. When to seek emergency medical attention Look for emergency warning signs* for COVID-19. If someone is showing any of these signs, seek emergency medical care immediately: Trouble breathing Persistent pain or pressure in the chest New confusion Inability to wake or stay awake Pale, gray, or blue-colored skin, lips, or nail beds, depending on skin tone *This list is not all possible symptoms. Please call your medical provider for any other symptoms that are severe or concerning to you. Call 911 or call ahead to your local emergency facility: Notify the operator that you are seeking care for someone who has or  may have COVID-19. Call ahead before visiting your doctor Call ahead. Many medical visits for routine care are being postponed or done by phone or telemedicine. If you have a medical appointment that cannot be postponed, call your doctor's office, and tell them you have or may have COVID-19. This will help the office protect themselves and other patients. If you are sick, wear a well-fitting mask You should wear a mask if you must be  around other people or animals, including pets (even at home). Wear a mask with the best fit, protection, and comfort for you. You don't need to wear the mask if you are alone. If you can't put on a mask (because of trouble breathing, for example), cover your coughs and sneezes in some other way. Try to stay at least 6 feet away from other people. This will help protect the people around you. Masks should not be placed on young children under age 68 years, anyone who has trouble breathing, or anyone who is not able to remove the mask without help. Cover your coughs and sneezes Cover your mouth and nose with a tissue when you cough or sneeze. Throw away used tissues in a lined trash can. Immediately wash your hands with soap and water for at least 20 seconds. If soap and water are not available, clean your hands with an alcohol-based hand sanitizer that contains at least 60% alcohol. Clean your hands often Wash your hands often with soap and water for at least 20 seconds. This is especially important after blowing your nose, coughing, or sneezing; going to the bathroom; and before eating or preparing food. Use hand sanitizer if soap and water are not available. Use an alcohol-based hand sanitizer with at least 60% alcohol, covering all surfaces of your hands and rubbing them together until they feel dry. Soap and water are the best option, especially if hands are visibly dirty. Avoid touching your eyes, nose, and mouth with unwashed hands. Handwashing Tips Avoid sharing  personal household items Do not share dishes, drinking glasses, cups, eating utensils, towels, or bedding with other people in your home. Wash these items thoroughly after using them with soap and water or put in the dishwasher. Clean surfaces in your home regularly Clean and disinfect high-touch surfaces (for example, doorknobs, tables, handles, light switches, and countertops) in your sick room and bathroom. In shared spaces, you should clean and disinfect surfaces and items after each use by the person who is ill. If you are sick and cannot clean, a caregiver or other person should only clean and disinfect the area around you (such as your bedroom and bathroom) on an as needed basis. Your caregiver/other person should wait as long as possible (at least several hours) and wear a mask before entering, cleaning, and disinfecting shared spaces that you use. Clean and disinfect areas that may have blood, stool, or body fluids on them. Use household cleaners and disinfectants. Clean visible dirty surfaces with household cleaners containing soap or detergent. Then, use a household disinfectant. Use a product from [employer] List N: Disinfectants for Coronavirus (COVID-19). Be sure to follow the instructions on the label to ensure safe and effective use of the product. Many products recommend keeping the surface wet with a disinfectant for a certain period of time (look at contact time on the product label). You may also need to wear personal protective equipment, such as gloves, depending on the directions on the product label. Immediately after disinfecting, wash your hands with soap and water for 20 seconds. For completed guidance on cleaning and disinfecting your home, visit Complete Disinfection Guidance. Take steps to improve ventilation at home Improve ventilation (air flow) at home to help prevent from spreading COVID-19 to other people in your household. Clear out COVID-19 virus particles in the  air by opening windows, using air filters, and turning on fans in your home. Use this interactive tool to learn how to improve air flow in your  home. When you can be around others after being sick with COVID-19 Deciding when you can be around others is different for different situations. Find out when you can safely end home isolation. For any additional questions about your care, contact your healthcare provider or state or local health department. 10/19/2020 Content source: [facility]piratory Diseases ([facility]), Division of Viral Diseases This information is not intended to replace advice given to you by your health care provider. Make sure you discuss any questions you have with your health care provider. Document Revised: 12/02/2020 Document Reviewed: 12/02/2020 Elsevier Patient Education  2022 [employer].     If you have been instructed to have an in-person evaluation today at a local Urgent Care facility, please use the link below. It will take you to a list of all of our available [facility], including address, phone number and hours of operation. Please do not delay care.  [facility]  If you or a family member do not have a primary care provider, use the link below to schedule a visit and establish care. When you choose a [facility] primary care physician or advanced practice provider, you gain a long-term partner in health. Find a Primary Care Provider  Learn more about [facility] in-office and virtual care options: [facility] - Get Care Now

## 2024-03-05 NOTE — Progress Notes (Signed)
 Virtual Visit Consent   Kevin Ryan, you are scheduled for a virtual visit with a [facility] provider today. Just as with appointments in the office, your consent must be obtained to participate. Your consent will be active for this visit and any virtual visit you may have with one of our providers in the next 365 days. If you have a MyChart account, a copy of this consent can be sent to you electronically.  As this is a virtual visit, video technology does not allow for your provider to perform a traditional examination. This may limit your provider's ability to fully assess your condition. If your provider identifies any concerns that need to be evaluated in person or the need to arrange testing (such as labs, EKG, etc.), we will make arrangements to do so. Although advances in technology are sophisticated, we cannot ensure that it will always work on either your end or our end. If the connection with a video visit is poor, the visit may have to be switched to a telephone visit. With either a video or telephone visit, we are not always able to ensure that we have a secure connection.  By engaging in this virtual visit, you consent to the provision of healthcare and authorize for your insurance to be billed (if applicable) for the services provided during this visit. Depending on your insurance coverage, you may receive a charge related to this service.  I need to obtain your verbal consent now. Are you willing to proceed with your visit today? Kevin Ryan has provided verbal consent on 03/05/2024 for a virtual visit (video or telephone). Delon CHRISTELLA Dickinson, PA-C  Date: 03/05/2024 2:59 PM   Virtual Visit via Video Note   I, Delon CHRISTELLA Dickinson, connected with  Kevin Ryan  ([id number], 73/10/1950) on 03/05/24 at  2:45 PM EDT by a video-enabled telemedicine application and verified that I am speaking with the correct person using two identifiers.  Location: Patient: Virtual Visit Location  Patient: Home Provider: Virtual Visit Location Provider: Home Office   I discussed the limitations of evaluation and management by telemedicine and the availability of in person appointments. The patient expressed understanding and agreed to proceed.    History of Present Illness: Kevin Ryan is a 73 y.o. who identifies as a male who was assigned male at birth, and is being seen today for Covid 40.  HPI: URI  This is a new problem. The current episode started in the past 7 days (Symptoms started Monday, 03/03/24 after recent travel. At home test for Covid 19 was positive today). The problem has been gradually worsening. The maximum temperature recorded prior to his arrival was 101 - 101.9 F (101.4, highest-yesterday). The fever has been present for Less than 1 day. Associated symptoms include congestion, coughing (mild), headaches, rhinorrhea (and post nasal drainage), sinus pain and a sore throat. Pertinent negatives include no chest pain, diarrhea, ear pain, nausea, plugged ear sensation, vomiting or wheezing. Associated symptoms comments: Chills, weakness. Treatments tried: alka seltzer cold and flu. The treatment provided mild relief.     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 12/07/2020   History of gastrointestinal stromal tumor (GIST) 07/29/2020   History of lumbosacral  spine surgery 07/29/2020   Obesity (BMI 30.0-34.9) 02/26/2020   Hypertension associated with diabetes (HCC) 01/29/2020   Aortic atherosclerosis (HCC) 01/27/2020   OSA on CPAP 09/19/2019   Hyperlipidemia associated with type 2 diabetes mellitus (HCC) 06/18/2018   Lung nodule 03/28/2018   DM2 (diabetes mellitus, type 2) (HCC) 03/25/2018    Coronary artery disease involving native coronary artery of native heart without angina pectoris 03/25/2018   Arthritis 03/25/2018   Hepatic steatosis 03/25/2018   Vitamin D  deficiency 03/25/2018   Personal history of tobacco use, presenting hazards to health 07/13/2015    Allergies:  Allergies  Allergen Reactions   Ibuprofen Itching and Rash   Medications:  Current Outpatient Medications:    molnupiravir  EUA (LAGEVRIO ) 200 mg CAPS capsule, Take 4 capsules (800 mg total) by mouth 2 (two) times daily for 5 days., Disp: 40 capsule, Rfl: 0   nirmatrelvir /ritonavir  (PAXLOVID ) 20 x 150 MG & 10 x 100MG  TABS, Take 3 tablets by mouth 2 (two) times daily for 5 days. (Take nirmatrelvir  150 mg two tablets twice daily for 5 days and ritonavir  100 mg one tablet twice daily for 5 days) Patient GFR is 71.39, Disp: 30 tablet, Rfl: 0   acetaminophen  (TYLENOL ) 500 MG tablet, Take 1,000 mg by mouth in the morning and at bedtime., Disp: , Rfl:    aspirin  81 MG tablet, Take 81 mg by mouth daily., Disp: , Rfl:    clopidogrel  (PLAVIX ) 75 MG tablet, Take 1 tablet (75 mg total) by mouth daily., Disp: 90 tablet, Rfl: 3   empagliflozin  (JARDIANCE ) 25 MG TABS tablet, Take 1 tablet (25 mg total) by mouth daily before breakfast. (Patient not taking: Reported on 11/29/2023), Disp: 90 tablet, Rfl: 3   ezetimibe  (ZETIA ) 10 MG tablet, Take 1 tablet (10 mg total) by mouth daily., Disp: 90 tablet, Rfl: 3   fenofibrate  micronized (LOFIBRA) 134 MG capsule, Take 1 capsule (134 mg total) by mouth daily before breakfast., Disp: 90 capsule, Rfl: 3   lisinopril  (ZESTRIL ) 2.5 MG tablet, Take 1 tablet (2.5 mg total) by mouth daily., Disp: 90 tablet, Rfl: 3   MAGNESIUM PO, Take by mouth daily., Disp: , Rfl:    metFORMIN  (GLUCOPHAGE -XR) 500 MG 24 hr tablet, TAKE TWO TABLETS BY MOUTH IN THE MORNINGAND AT BEDTIME, Disp: 360 tablet, Rfl: 3   metoprolol  tartrate (LOPRESSOR ) 50 MG tablet, Take 1 tablet (50 mg total) by mouth 2 (two) times  daily., Disp: 180 tablet, Rfl: 3   mupirocin  ointment (BACTROBAN ) 2 %, Apply 1 Application topically 2 (two) times daily. Prn wounds (Patient not taking: Reported on 11/29/2023), Disp: 30 g, Rfl: 0   Probiotic Product (PROBIOTIC DAILY PO), Take by mouth., Disp: , Rfl:    rosuvastatin  (CRESTOR ) 40 MG tablet, Take 1 tablet (40 mg total) by mouth at bedtime., Disp: 90 tablet, Rfl: 3   Semaglutide  (RYBELSUS ) 7 MG TABS, Take 1 tablet (7 mg total) by mouth daily. Pt asst program, Disp: 30 tablet, Rfl: 0   sildenafil  (REVATIO ) 20 MG tablet, Take 1-5 tablets (20-100 mg total) by mouth daily. Up to 4 hours before intercourse (Patient not taking: Reported on 11/29/2023), Disp: 90 tablet, Rfl: 0   sucralfate  (CARAFATE ) 1 g tablet, Take 1 tablet (1 g total) by mouth 4 (four) times daily. (Patient not taking: Reported on 11/29/2023), Disp: 120 tablet, Rfl: 1   vitamin B-12 (CYANOCOBALAMIN ) 500 MCG tablet, Take 5,000 mcg by mouth daily. , Disp: , Rfl:    Vitamin D , Ergocalciferol , (DRISDOL ) 1.25  MG (50000 UNIT) CAPS capsule, Take 1 capsule (50,000 Units total) by mouth every 7 (seven) days., Disp: 30 capsule, Rfl: 1  Observations/Objective: Patient is well-developed, well-nourished in no acute distress.  Resting comfortably at home.  Head is normocephalic, atraumatic.  No labored breathing.  Speech is clear and coherent with logical content.  Patient is alert and oriented at baseline.    Assessment and Plan: 1. COVID-19 (Primary) - nirmatrelvir /ritonavir  (PAXLOVID ) 20 x 150 MG & 10 x 100MG  TABS; Take 3 tablets by mouth 2 (two) times daily for 5 days. (Take nirmatrelvir  150 mg two tablets twice daily for 5 days and ritonavir  100 mg one tablet twice daily for 5 days) Patient GFR is 71.39  Dispense: 30 tablet; Refill: 0 - molnupiravir  EUA (LAGEVRIO ) 200 mg CAPS capsule; Take 4 capsules (800 mg total) by mouth 2 (two) times daily for 5 days.  Dispense: 40 capsule; Refill: 0  - Continue OTC symptomatic management of  choice - Will send OTC vitamins and supplement information through AVS - Paxlovid  prescribed- Recommended first, however, may be cost prohibitive; Molnupiravir  sent so cost can be compared.  - HOLD Crestor  (Rosuvastatin ) while on Paxlovid  - Push fluids - Rest as needed - Discussed return precautions and when to seek in-person evaluation, sent via AVS as well   Follow Up Instructions: I discussed the assessment and treatment plan with the patient. The patient was provided an opportunity to ask questions and all were answered. The patient agreed with the plan and demonstrated an understanding of the instructions.  A copy of instructions were sent to the patient via MyChart unless otherwise noted below.    The patient was advised to call back or seek an in-person evaluation if the symptoms worsen or if the condition fails to improve as anticipated.    Delon CHRISTELLA Dickinson, PA-C

## 2024-03-05 NOTE — Telephone Encounter (Signed)
 Pt is scheduled for a telehealth appt this afternoon.

## 2024-03-05 NOTE — Telephone Encounter (Signed)
 Summary: Covid   Patient's spouse called in for patient. Patient is currently in bed. Tested positive for covid last night. Fever of 101.4 Patient is diabetic, not wanting to eat anything. Sugar was tested. Sugar is fine. Patient's wife is wondering if a medication can be prescibed- paxlovid .  [phone] Patient's Spouse- Diane.  [facility] - [place], [place] - [address] [place] [address] Phone: [phone] Fax: [phone]

## 2024-03-05 NOTE — Telephone Encounter (Signed)
 FYI Only or Action Required?: FYI only for provider.  Patient was last seen in primary care on 10/11/2023 by Hope Merle, MD.  Called Nurse Triage reporting Covid Positive.  Symptoms began several days ago.  Interventions attempted: OTC medications: Tylenol , cold medication .  Symptoms are: unchanged.  Triage Disposition: Call PCP Within 24 Hours  Patient/caregiver understands and will follow disposition?: Yes- no open appointment- UC/VV scheduled   Reason for Disposition  [1] HIGH RISK patient (e.g., weak immune system, age > 64 years, obesity with BMI 30 or higher, pregnant, chronic lung disease or other chronic medical condition) AND [2] COVID symptoms (e.g., cough, fever)  (Exceptions: Already seen by PCP and no new or worsening symptoms.)  Answer Assessment - Initial Assessment Questions 1. COVID-19 DIAGNOSIS: How do you know that you have COVID? (e.g., positive lab test or self-test, diagnosed by doctor or NP/PA, symptoms after exposure).     Home test- + COVID 2. COVID-19 EXPOSURE: Was there any known exposure to COVID before the symptoms began? CDC Definition of close contact: within 6 feet (2 meters) for a total of 15 minutes or more over a 24-hour period.      Recent air travel 3. ONSET: When did the COVID-19 symptoms start?      Sneeze started Monday 4. WORST SYMPTOM: What is your worst symptom? (e.g., cough, fever, shortness of breath, muscle aches)     Lack of appetite, chills   5. COUGH: Do you have a cough? If Yes, ask: How bad is the cough?       No- once in a while 6. FEVER: Do you have a fever? If Yes, ask: What is your temperature, how was it measured, and when did it start?     101.4 yesterday- oral 7. RESPIRATORY STATUS: Describe your breathing? (e.g., normal; shortness of breath, wheezing, unable to speak)      normal 8. BETTER-SAME-WORSE: Are you getting better, staying the same or getting worse compared to yesterday?  If getting worse,  ask, In what way?     same 9. OTHER SYMPTOMS: Do you have any other symptoms?  (e.g., chills, fatigue, headache, loss of smell or taste, muscle pain, sore throat)     Fatigue, weakness 10. HIGH RISK DISEASE: Do you have any chronic medical problems? (e.g., asthma, heart or lung disease, weak immune system, obesity, etc.)       Yes- heart disease  Protocols used: Coronavirus (COVID-19) Diagnosed or Suspected-A-AH

## 2024-03-10 ENCOUNTER — Ambulatory Visit: Payer: PRIVATE HEALTH INSURANCE | Admitting: Nurse Practitioner

## 2024-03-24 ENCOUNTER — Ambulatory Visit (INDEPENDENT_AMBULATORY_CARE_PROVIDER_SITE_OTHER): Payer: PRIVATE HEALTH INSURANCE | Admitting: Nurse Practitioner

## 2024-03-24 ENCOUNTER — Encounter: Payer: Self-pay | Admitting: Nurse Practitioner

## 2024-03-24 VITALS — BP 134/80 | HR 71 | Temp 97.9°F | Ht 73.0 in | Wt 231.0 lb

## 2024-03-24 DIAGNOSIS — G4733 Obstructive sleep apnea (adult) (pediatric): Secondary | ICD-10-CM

## 2024-03-24 DIAGNOSIS — R911 Solitary pulmonary nodule: Secondary | ICD-10-CM | POA: Diagnosis not present

## 2024-03-24 NOTE — Progress Notes (Signed)
 @Patient  ID: Kevin Ryan, male    DOB: 05-24-1951, 73 y.o.   MRN: [id number]  Chief Complaint  Patient presents with   Obstructive Sleep Apnea    Using CPAP every night. No problems with mask or pressure.     Referring provider: Hope Merle, MD  HPI: 73 year old male, some day smoker followed for OSA on CPAP. Past medical history significant for OSA, CAD, HTN, DM, hepatic steatosis, HLD.   TEST/EVENTS:  07/03/2018 HST: AHI 25.8/h, SpO2 low 80%  11/29/2023: OV with Thersa Mohiuddin NP Kevin Ryan is a 73 year old male with moderate sleep apnea who presents to re-establish care regarding his OSA and CPAP machine. He uses a CPAP machine for his moderate sleep apnea, but the current machine is malfunctioning, producing an unusual noise. He previously used a Environmental education officer, which he believes contributed to the issue, and has since discontinued its use. He perceives the CPAP pressure as weaker than before. Despite these issues, he reports adequate sleep quality with no excessive tiredness or episodes of falling asleep while driving. He is wanting to get a new machine. No snoring with CPAP. Denies drowsy driving or morning headaches. He does wake up multiple times a night, which seemed to start with the CPAP making an odd noise.  Prior to this, he was sleeping through the night for the most part.  Did sometimes get up to use the restroom.  Goes to bed around 11 PM to 2 AM.  Falls asleep within 10 minutes.  Gets up around 8 AM.  He is retired.  Weight is down 35 pounds over the last 2 years. No issues with bloating or belching with CPAP.  He is a active smoker but does not smoke every day.  Has 25-pack-year history.  No excessive alcohol or caffeine intake.  Lives with his wife. He has a history of smoking and has previously undergone lung cancer screening CT scans, with the last one in 2021. He is reluctant to continue these screenings, citing a past diagnosis of stomach cancer in 2017, which was treated  successfully. He is planning a beach trip with friends to play golf and is concerned about receiving his new CPAP machine before the trip.  Epworth 5 10/28/2023-11/26/2023 CPAP 10-20 cmH2O 29/30 days; 93% >4 hr; average use 7 hr 18 min Pressure 95th 14.5 Leaks 95th 0.1 AHI 0.5  03/24/2024: Today - follow up Patient presents today for follow-up.  He was prescribed a new CPAP machine at his last visit.  He received this in May.  Has been sleeping with it nightly.  Feels like the current machine is working well for him.  Not waking up quite as much.  Sleep feels restful.  Energy levels are good during the day.  No issues with drowsy driving, morning headaches or sleep parasomnia/paralysis.  Has not wanted to move forward with lung cancer screening CTs despite smoking history.  No issues with his breathing.  12/25/2023-03/23/2024: CPAP 10-20 cmH2O 90/90 days; 100% >4 hr; average use 7 hours 7 minutes Pressure 95th 14.4 Leaks 95th 12.9 AHI 0.6   Allergies  Allergen Reactions   Ibuprofen Itching and Rash    Immunization History  Administered Date(s) Administered   Fluad Quad(high Dose 65+) 06/09/2021   Influenza-Unspecified 05/01/2019   Pneumococcal Conjugate-13 05/23/2016   Pneumococcal Polysaccharide-23 06/17/2019   Tdap 10/14/2021   Zoster Recombinant(Shingrix ) 05/26/2022, 08/24/2022   Zoster, Live 02/13/2017    Past Medical History:  Diagnosis Date  Abnormal CT of the abdomen    Anemia    decreased platelets in last blood work   Arthritis    spine, L1-5   CAD (coronary artery disease)    s/p MI   CAD (coronary artery disease)    s/p stent x 1 2004    CAD (coronary artery disease) 03/25/2018   Cancer (HCC)    spindle cell stomach    Cervical spondylosis with myelopathy 11/21/2017   CTS (carpal tunnel syndrome) 03/28/2018   Diabetes mellitus without complication (HCC)    oral meds, Type 2   Elevated TSH 03/28/2018   Erectile dysfunction 04/26/2022   Excessive cerumen  in both ear canals 05/16/2018   Gastric mass 04/12/2015   cancer; resected   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 We switched to fenofibrate  in June. He should be taking rosuvastatin  40 mg + ezetimibe  10 mg + fenofibrate  134 mg daily. 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    Myocardial infarction [facility]) 2004   Personal history of tobacco use, presenting hazards to health 07/13/2015   Pneumonia    Shoulder pain, right 01/27/2020   Dr. 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   Vertigo    none in approx 1 yr   Vertigo 09/17/2018   Viral upper respiratory  tract infection 07/19/2021   Wears dentures    partial upper   Wears hearing aid in both ears     Tobacco History: Social History   Tobacco Use  Smoking Status Some Days   Current packs/day: 0.50   Average packs/day: 0.5 packs/day for 55.6 years (27.8 ttl pk-yrs)   Types: Cigarettes   Start date: 1970  Smokeless Tobacco Former   Quit date: 06/16/1969  Tobacco Comments   1 ppd or a little less (since age 48)   Ready to quit: Not Answered Counseling given: Not Answered Tobacco comments: 1 ppd or a little less (since age 4)   Outpatient Medications Prior to Visit  Medication Sig Dispense Refill   acetaminophen  (TYLENOL ) 500 MG tablet Take 1,000  mg by mouth in the morning and at bedtime.     aspirin  81 MG tablet Take 81 mg by mouth daily.     clopidogrel  (PLAVIX ) 75 MG tablet Take 1 tablet (75 mg total) by mouth daily. 90 tablet 3   empagliflozin  (JARDIANCE ) 25 MG TABS tablet Take 1 tablet (25 mg total) by mouth daily before breakfast. 90 tablet 3   ezetimibe  (ZETIA ) 10 MG tablet Take 1 tablet (10 mg total) by mouth daily. 90 tablet 3   fenofibrate  micronized (LOFIBRA) 134 MG capsule Take 1 capsule (134 mg total) by mouth daily before breakfast. 90 capsule 3   Folic Acid (FOLATE PO) Take 1 tablet by mouth daily in the afternoon.     lisinopril  (ZESTRIL ) 2.5 MG tablet Take 1 tablet (2.5 mg total) by mouth daily. 90 tablet 3   MAGNESIUM PO Take by mouth daily.     metFORMIN  (GLUCOPHAGE -XR) 500 MG 24 hr tablet TAKE TWO TABLETS BY MOUTH IN THE MORNINGAND AT BEDTIME 360 tablet 3   metoprolol  tartrate (LOPRESSOR ) 50 MG tablet Take 1 tablet (50 mg total) by mouth 2 (two) times daily. 180 tablet 3   mupirocin  ointment (BACTROBAN ) 2 % Apply 1 Application topically 2 (two) times daily. Prn wounds (Patient taking differently: Apply 1 Application topically as needed. Prn wounds) 30 g 0   Probiotic Product (PROBIOTIC DAILY PO) Take by mouth.     rosuvastatin  (CRESTOR ) 40 MG tablet Take 1  tablet (40 mg total) by mouth at bedtime. 90 tablet 3   Semaglutide  (RYBELSUS ) 7 MG TABS Take 1 tablet (7 mg total) by mouth daily. Pt asst program 30 tablet 0   vitamin B-12 (CYANOCOBALAMIN ) 500 MCG tablet Take 5,000 mcg by mouth daily.      Vitamin D , Ergocalciferol , (DRISDOL ) 1.25 MG (50000 UNIT) CAPS capsule Take 1 capsule (50,000 Units total) by mouth every 7 (seven) days. 30 capsule 1   sildenafil  (REVATIO ) 20 MG tablet Take 1-5 tablets (20-100 mg total) by mouth daily. Up to 4 hours before intercourse (Patient not taking: Reported on 03/24/2024) 90 tablet 0   sucralfate  (CARAFATE ) 1 g tablet Take 1 tablet (1 g total) by mouth 4 (four) times daily. (Patient not taking: Reported on 03/24/2024) 120 tablet 1   No facility-administered medications prior to visit.     Review of Systems:   Constitutional: No weight loss or gain, night sweats, fevers, chills, fatigue, or lassitude. HEENT: No headaches CV:  No chest pain, orthopnea, PND, swelling in lower extremities, anasarca, dizziness, palpitations, syncope Resp: No shortness of breath with exertion or at rest. No cough Psych: No depression or anxiety. Mood stable. +sleep disturbance (improved)    Physical Exam:  BP 134/80   Pulse 71   Temp 97.9 F (36.6 C) (Oral)   Ht 6' 1 (1.854 m)   Wt 231 lb (104.8 kg)   SpO2 99%   BMI 30.48 kg/m   GEN: Pleasant, interactive, well-appearing; in no acute distress HEENT:  Normocephalic and atraumatic. PERRLA. Sclera white. Nasal turbinates pink, moist and patent bilaterally. No rhinorrhea present. Oropharynx pink and moist, without exudate or edema. No lesions, ulcerations, or postnasal drip. Mallampati III NECK:  Supple w/ fair ROM. Thyroid  symmetrical with no goiter or nodules palpated. No lymphadenopathy.   CV: RRR, no m/r/g PULMONARY:  Unlabored, regular breathing. Clear bilaterally A&P w/o wheezes/rales/rhonchi. No accessory muscle use.  GI: BS present and normoactive. Soft, non-tender to  palpation.  MSK: No erythema, warmth or tenderness. Cap refil <  2 sec all extrem.  Neuro: A/Ox3. No focal deficits noted.   Skin: Warm, no lesions or rashe Psych: Normal affect and behavior. Judgement and thought content appropriate.     Lab Results:  CBC    Component Value Date/Time   WBC 10.4 10/11/2023 1350   RBC 4.70 10/11/2023 1350   HGB 15.4 10/11/2023 1350   HGB 14.6 01/01/2014 1712   HCT 45.9 10/11/2023 1350   HCT 43.9 01/01/2014 1712   PLT 162.0 10/11/2023 1350   PLT 147 (L) 01/01/2014 1712   MCV 97.8 10/11/2023 1350   MCV 96 01/01/2014 1712   MCH 32.2 08/03/2023 1425   MCHC 33.5 10/11/2023 1350   RDW 13.8 10/11/2023 1350   RDW 13.2 01/01/2014 1712   LYMPHSABS 3.0 10/11/2023 1350   MONOABS 0.6 10/11/2023 1350   EOSABS 0.3 10/11/2023 1350   BASOSABS 0.1 10/11/2023 1350    BMET    Component Value Date/Time   NA 136 10/11/2023 1350   K 4.4 10/11/2023 1350   CL 100 10/11/2023 1350   CO2 23 10/11/2023 1350   GLUCOSE 169 (H) 10/11/2023 1350   BUN 21 10/11/2023 1350   CREATININE 1.04 10/11/2023 1350   CREATININE 1.01 05/16/2018 0800   CALCIUM  9.8 10/11/2023 1350   GFRNONAA >60 07/08/2023 1453   GFRAA >60 01/30/2017 0409    BNP No results found for: BNP   Imaging:  No results found.  Administration History     None           No data to display          No results found for: NITRICOXIDE      Assessment & Plan:   OSA on CPAP Moderate OSA on CPAP. Excellent compliance and control. Receives benefit from use. Aware of proper care/use of device. Healthy weight management encouraged. Safe driving practices reviewed.  Aware of risks of untreated sleep apnea.  Patient Instructions  Continue to use CPAP every night, minimum of 4-6 hours a night.  Change equipment as directed. Wash your tubing with warm soap and water daily, hang to dry. Wash humidifier portion weekly. Use bottled, distilled water and change daily Be aware of reduced  alertness and do not drive or operate heavy machinery if experiencing this or drowsiness.  Exercise encouraged, as tolerated. Healthy weight management discussed.  Avoid or decrease alcohol consumption and medications that make you more sleepy, if possible. Notify if persistent daytime sleepiness occurs even with consistent use of PAP therapy.  Change CPAP supplies... Every month Mask cushions and/or nasal pillows CPAP machine filters Every 3 months Mask frame (not including the headgear) CPAP tubing Every 6 months Mask headgear Chin strap (if applicable) Humidifier water tub  Bring your CPAP machine back up here this week   Follow up in one year with Izetta Gregrey Bloyd,NP, or sooner, if needed    Lung nodule Scattered small nodules, Lung RADS 2 on prior CT imaging 2021. He is an active smoker. Smoking cessation advised - no desire to quit at this time. Encouraged to re-enroll in lung cancer screening program and educated on importance. He will consider this and notify us  at his follow up if he's willing to re-enroll.   Advised if symptoms do not improve or worsen, to please contact office for sooner follow up or seek emergency care.   I spent 25 minutes of dedicated to the care of this patient on the date of this encounter to include pre-visit review of records, face-to-face time with  the patient discussing conditions above, post visit ordering of testing, clinical documentation with the electronic health record, making appropriate referrals as documented, and communicating necessary findings to members of the patients care team.  Comer LULLA Rouleau, NP 03/24/2024  Pt aware and understands NP's role.

## 2024-03-24 NOTE — Assessment & Plan Note (Addendum)
 Moderate OSA on CPAP. Excellent compliance and control. Receives benefit from use. Aware of proper care/use of device. Healthy weight management encouraged. Safe driving practices reviewed.  Aware of risks of untreated sleep apnea.  Patient Instructions  Continue to use CPAP every night, minimum of 4-6 hours a night.  Change equipment as directed. Wash your tubing with warm soap and water daily, hang to dry. Wash humidifier portion weekly. Use bottled, distilled water and change daily Be aware of reduced alertness and do not drive or operate heavy machinery if experiencing this or drowsiness.  Exercise encouraged, as tolerated. Healthy weight management discussed.  Avoid or decrease alcohol consumption and medications that make you more sleepy, if possible. Notify if persistent daytime sleepiness occurs even with consistent use of PAP therapy.  Change CPAP supplies... Every month Mask cushions and/or nasal pillows CPAP machine filters Every 3 months Mask frame (not including the headgear) CPAP tubing Every 6 months Mask headgear Chin strap (if applicable) Humidifier water tub  Bring your CPAP machine back up here this week   Follow up in one year with Kevin Laiylah Roettger,NP, or sooner, if needed

## 2024-03-24 NOTE — Assessment & Plan Note (Signed)
 Scattered small nodules, Lung RADS 2 on prior CT imaging 2021. He is an active smoker. Smoking cessation advised - no desire to quit at this time. Encouraged to re-enroll in lung cancer screening program and educated on importance. He will consider this and notify us  at his follow up if he's willing to re-enroll.

## 2024-03-24 NOTE — Patient Instructions (Signed)
 Continue to use CPAP every night, minimum of 4-6 hours a night.  Change equipment as directed. Wash your tubing with warm soap and water daily, hang to dry. Wash humidifier portion weekly. Use bottled, distilled water and change daily Be aware of reduced alertness and do not drive or operate heavy machinery if experiencing this or drowsiness.  Exercise encouraged, as tolerated. Healthy weight management discussed.  Avoid or decrease alcohol consumption and medications that make you more sleepy, if possible. Notify if persistent daytime sleepiness occurs even with consistent use of PAP therapy.  Change CPAP supplies... Every month Mask cushions and/or nasal pillows CPAP machine filters Every 3 months Mask frame (not including the headgear) CPAP tubing Every 6 months Mask headgear Chin strap (if applicable) Humidifier water tub  Bring your CPAP machine back up here this week   Follow up in one year with Kevin Nikelle Malatesta,NP, or sooner, if needed

## 2024-04-22 ENCOUNTER — Encounter: Payer: Self-pay | Admitting: Pharmacist

## 2024-04-22 ENCOUNTER — Ambulatory Visit (INDEPENDENT_AMBULATORY_CARE_PROVIDER_SITE_OTHER): Payer: PRIVATE HEALTH INSURANCE

## 2024-04-22 VITALS — BP 100/54 | HR 81 | Temp 98.7°F | Ht 73.0 in | Wt 232.4 lb

## 2024-04-22 DIAGNOSIS — I152 Hypertension secondary to endocrine disorders: Secondary | ICD-10-CM

## 2024-04-22 DIAGNOSIS — G4733 Obstructive sleep apnea (adult) (pediatric): Secondary | ICD-10-CM | POA: Diagnosis not present

## 2024-04-22 DIAGNOSIS — I739 Peripheral vascular disease, unspecified: Secondary | ICD-10-CM

## 2024-04-22 DIAGNOSIS — Z9889 Other specified postprocedural states: Secondary | ICD-10-CM

## 2024-04-22 DIAGNOSIS — I2510 Atherosclerotic heart disease of native coronary artery without angina pectoris: Secondary | ICD-10-CM

## 2024-04-22 DIAGNOSIS — E7849 Other hyperlipidemia: Secondary | ICD-10-CM

## 2024-04-22 DIAGNOSIS — E785 Hyperlipidemia, unspecified: Secondary | ICD-10-CM

## 2024-04-22 DIAGNOSIS — E1142 Type 2 diabetes mellitus with diabetic polyneuropathy: Secondary | ICD-10-CM

## 2024-04-22 DIAGNOSIS — E1169 Type 2 diabetes mellitus with other specified complication: Secondary | ICD-10-CM | POA: Diagnosis not present

## 2024-04-22 DIAGNOSIS — E1159 Type 2 diabetes mellitus with other circulatory complications: Secondary | ICD-10-CM | POA: Diagnosis not present

## 2024-04-22 DIAGNOSIS — I700 Atherosclerosis of aorta: Secondary | ICD-10-CM

## 2024-04-22 DIAGNOSIS — Z87891 Personal history of nicotine dependence: Secondary | ICD-10-CM

## 2024-04-22 DIAGNOSIS — Z7984 Long term (current) use of oral hypoglycemic drugs: Secondary | ICD-10-CM

## 2024-04-22 DIAGNOSIS — E119 Type 2 diabetes mellitus without complications: Secondary | ICD-10-CM

## 2024-04-22 NOTE — Assessment & Plan Note (Signed)
 Residual foot drop post-lumbar spine surgery Significant improvement post-surgery. Has ankle foot orthosis for this.

## 2024-04-22 NOTE — Progress Notes (Signed)
 Established Patient Office Visit TOC from Dr. Hope    Subjective  Patient ID: Kevin Ryan, male    DOB: [birth year]  Age: 73 y.o. 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), GERD (gastroesophageal reflux disease), History of kidney stones, Hypercholesterolemia, Hyperlipidemia (06/18/2018), Hypertension, Hypogonadism in male (12/11/2019), Idiopathic peripheral neuropathy (11/18/2019), Long term use of drug (01/23/2018), Low testosterone  in male (05/14/2020), 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  HPI Discussed the use of AI scribe software for clinical note transcription with the patient, who gave verbal consent to proceed.  History of Present Illness Kevin Ryan is a 73 year old male with diabetes and shoulder pain who presents for a follow-up  visit.  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 He manages the pain with Tylenol , taking 650 mg in the morning and evening. He has not pursued surgery due to concerns about not regaining strength post-operation. Despite the pain, he continues to play golf.  He has a history of diabetes with a last A1c of 8.9% in December. He takes multiple medications for diabetes management, including Jardiance  25 mg, metformin  (two tablets in the morning and two in the evening), and Rybelsus  7 mg. His dietary habits include high carbohydrate intake, and he does not check his blood sugar regularly due to finger soreness.  He underwent back surgery in 2017 at [facility], where arthritis was drilled off his spinal column. He reports significant improvement post-surgery, avoiding wheelchair use, but experiences foot drop as a residual issue. He also reports that his feet have looked the same for years since his back surgery.  He has a long history of smoking since age 20, currently smoking two to three cigarettes a day, sometimes more. He acknowledges the impact of smoking on his health but has reduced his intake over time.  He uses a CPAP machine nightly for sleep apnea and had it upgraded recently.  He takes multiple medications for various conditions, including aspirin , Plavix , B12, vitamin D , Zetia , fenofibrate , folic acid, lisinopril  2.5 mg, metoprolol , Crestor  40 mg, and Carafate  as needed for acid reflux. No current use of Carafate  for acid reflux.  He has a history of cataract surgery on both eyes and plans to have an eye exam soon as it has been a year since the last one. He also reports a history of low blood pressure and does not monitor it at home. His breakfast typically consists of bacon, eggs, grits, and toast with jelly.  ROS As per HPI    Objective:     BP (!) 100/54 (  BP Location: Right Arm, Patient Position: Sitting, Cuff Size: Normal)    Pulse 81   Temp 98.7 F (37.1 C) (Oral)   Ht 6' 1 (1.854 m)   Wt 232 lb 6.4 oz (105.4 kg)   SpO2 96%   BMI 30.66 kg/m      04/22/2024    2:07 PM 10/11/2023    1:07 PM 07/13/2023   11:05 AM  Depression screen PHQ 2/9  Decreased Interest 0 0 0  Down, Depressed, Hopeless 0 0 0  PHQ - 2 Score 0 0 0  Altered sleeping 1 0 1  Tired, decreased energy 0 0 0  Change in appetite 0 0 0  Feeling bad or failure about yourself  0 0 0  Trouble concentrating 0 0 0  Moving slowly or fidgety/restless 0 0 0  Suicidal thoughts 0 0 0  PHQ-9 Score 1 0 1  Difficult doing work/chores Not difficult at all Not difficult at all Not difficult at all      04/22/2024    2:08 PM 10/11/2023    1:07 PM 07/13/2023   11:05 AM  GAD 7 : Generalized Anxiety Score  Nervous, Anxious, on Edge 0 0 0  Control/stop worrying 0 0 0  Worry too much - different things 0 0 0  Trouble relaxing 0 0 0  Restless 0 0 0  Easily annoyed or irritable 0 0 0  Afraid - awful might happen 0 0 0  Total GAD 7 Score 0 0 0  Anxiety Difficulty Not difficult at all Not difficult at all Not difficult at all      04/22/2024    2:07 PM 10/11/2023    1:07 PM 07/13/2023   11:05 AM  Depression screen PHQ 2/9  Decreased Interest 0 0 0  Down, Depressed, Hopeless 0 0 0  PHQ - 2 Score 0 0 0  Altered sleeping 1 0 1  Tired, decreased energy 0 0 0  Change in appetite 0 0 0  Feeling bad or failure about yourself  0 0 0  Trouble concentrating 0 0 0  Moving slowly or fidgety/restless 0 0 0  Suicidal thoughts 0 0 0  PHQ-9 Score 1 0 1  Difficult doing work/chores Not difficult at all Not difficult at all Not difficult at all      04/22/2024    2:08 PM 10/11/2023    1:07 PM 07/13/2023   11:05 AM  GAD 7 : Generalized Anxiety Score  Nervous, Anxious, on Edge 0 0 0  Control/stop worrying 0 0 0  Worry too much - different things 0 0 0  Trouble relaxing 0 0 0  Restless 0 0 0  Easily annoyed or irritable 0 0 0  Afraid - awful might  happen 0 0 0  Total GAD 7 Score 0 0 0  Anxiety Difficulty Not difficult at all Not difficult at all Not difficult at all   SDOH Screenings   Food Insecurity: No Food Insecurity (08/10/2020)  Housing: Low Risk  (08/10/2020)  Transportation Needs: No Transportation Needs (08/10/2020)  Depression (PHQ2-9): Low Risk  (04/22/2024)  Financial Resource Strain: Medium Risk (07/18/2021)  Social Connections: Unknown (08/10/2020)  Stress: No Stress Concern Present (08/10/2020)  Tobacco Use: High Risk (04/22/2024)     Physical Exam Constitutional:      General: He is not in acute distress. HENT:     Head: Normocephalic and atraumatic.     Ears:     Comments: Wearing hearing aids bilaterally  Mouth/Throat:     Mouth: Mucous membranes are moist.     Pharynx: Oropharynx is clear.  Cardiovascular:     Rate and Rhythm: Normal rate.     Pulses: Normal pulses.  Pulmonary:     Breath sounds: No wheezing.  Abdominal:     Palpations: Abdomen is soft.     Tenderness: There is no guarding.  Musculoskeletal:     Right lower leg: No edema.     Left lower leg: No edema.  Skin:    General: Skin is warm.  Neurological:     Mental Status: He is alert and oriented to person, place, and time.      Diabetic foot exam was performed with the following findings:   Intact posterior tibialis and dorsalis pedis pulses Bilateral toes appears mildly purple in color, decreased sensation to 10-g monofilament testing bilaterally     No results found for any visits on 04/22/24.  The ASCVD Risk score (Arnett DK, et al., 2019) failed to calculate for the following reasons:   Risk score cannot be calculated because patient has a medical history suggesting prior/existing ASCVD     Assessment & Plan:  Patient presents for TOC. Recommend continuing current medications, pending labs for further recommendations. Not needing refill at this time as previous PCP has sent refill for one year in 10/11/2023.   Type 2  diabetes mellitus with other specified complication, without long-term current use of insulin  [facility]) Assessment & Plan: Foot exam completed.  Peripheral neuropathy and possible vascular insufficiency indicated by cool, bluish toes, reduced monofilament exam. Risk: uncontrolled DM, smoking, hyperlipidemia.  Vascular surgery referral made today, will benefit from ABI/duplex ultrasound. Risk factor management discussed    Orders: -     Hemoglobin A1c  Hypertension associated with diabetes (HCC) Assessment & Plan: BP with in goal today, slightly on lower side as he has not eaten lunch. Continue Lisinopril  2.5 mg daily, Metoprolol  50 mg BID. Check CMP.   Orders: -     Hemoglobin A1c -     Comprehensive metabolic panel with GFR  Hyperlipidemia associated with type 2 diabetes mellitus (HCC) -     Lipid panel  OSA on CPAP Assessment & Plan: Compliant with CPAP, uses it every night. Has seen pulmonologist for this.    Peripheral vascular disease Assessment & Plan: Peripheral neuropathy and possible vascular insufficiency indicated by cool, bluish toes, reduced monofilament exam. Risk: uncontrolled DM, smoking, hyperlipidemia.  Vascular surgery referral made today, will benefit from ABI/duplex ultrasound. Risk factor management discussed   Orders: -     Ambulatory referral to Vascular Surgery  Encounter for diabetic foot exam [facility]) Assessment & Plan: Foot exam completed.  Peripheral neuropathy and possible vascular insufficiency indicated by cool, bluish toes, reduced monofilament exam. Risk: uncontrolled DM, smoking, hyperlipidemia.  Vascular surgery referral made today, will benefit from ABI/duplex ultrasound. Risk factor management discussed     Diabetic polyneuropathy associated with type 2 diabetes mellitus (HCC) Assessment & Plan: Poor glycemic control with A1c at 8.9%. Peripheral neuropathy and possible vascular insufficiency indicated by cool, bluish toes. Non-compliance with  diet and high carbohydrate intake noted. Smoking exacerbates circulatory issues.  Order A1c and relevant labs to assess glycemic control. Refer to vascular surgery for arterial blood supply evaluation. Advise dietary modifications to reduce carbohydrate intake and use sugar substitutes. Encourage smoking cessation. Continue Rybelsus  7 mg daily, did not tolerate 14 mg in the past.  Continue Metformin  1000 mg BID (two 500 mg in AM  and PM). Continue Jardiance  25 mg daily.  He is part of patient assistance program for both Jardiance  and Rybelsus , patient brought a prior auth form. This was discussed with pharmacist Manuelita, will work on sending prior auth form.     Aortic atherosclerosis Assessment & Plan: Chronic. Risk factor management including diabetes, PVD, smoking cessation discussed.   Continue Crestor  40 mg, Zetia  10 mg, Aspirin  81 mg daily.    Personal history of tobacco use, presenting hazards to health Assessment & Plan: Declines smoking cessation counseling and low dose lung cancer screening CT, will continue to follow up   History of lumbosacral spine surgery Assessment & Plan: Residual foot drop post-lumbar spine surgery Significant improvement post-surgery. Has ankle foot orthosis for this.    Coronary artery disease involving native coronary artery of native heart without angina pectoris Assessment & Plan: CAD s/p Cypher stent ramus, currently asymptomatic.  Continue Aspirin  81 mg daily, Clopidogrel  75 mg daily. Cardiology at [facility]     Return in about 3 months (around 07/22/2024).   Luke Shade, MD

## 2024-04-22 NOTE — Assessment & Plan Note (Signed)
 BP with in goal today, slightly on lower side as he has not eaten lunch. Continue Lisinopril  2.5 mg daily, Metoprolol  50 mg BID. Check CMP.

## 2024-04-22 NOTE — Assessment & Plan Note (Signed)
 CAD s/p Cypher stent ramus, currently asymptomatic.  Continue Aspirin  81 mg daily, Clopidogrel  75 mg daily. Cardiology at [facility]

## 2024-04-22 NOTE — Assessment & Plan Note (Addendum)
 Chronic. Risk factor management including diabetes, PVD, smoking cessation discussed.   Continue Crestor  40 mg, Zetia  10 mg, Aspirin  81 mg daily.

## 2024-04-22 NOTE — Assessment & Plan Note (Signed)
 Compliant with CPAP, uses it every night. Has seen pulmonologist for this.

## 2024-04-22 NOTE — Assessment & Plan Note (Signed)
 Declines smoking cessation counseling and low dose lung cancer screening CT, will continue to follow up

## 2024-04-22 NOTE — Assessment & Plan Note (Addendum)
 Foot exam completed.  Peripheral neuropathy and possible vascular insufficiency indicated by cool, bluish toes, reduced monofilament exam. Risk: uncontrolled DM, smoking, hyperlipidemia.  Vascular surgery referral made today, will benefit from ABI/duplex ultrasound. Risk factor management discussed

## 2024-04-22 NOTE — Patient Instructions (Addendum)
-   You are due for diabetic eye exam, please call [facility] to schedule diabetic eye exam.   - Diet: Emphasize whole grains, lean proteins, fruits, and vegetables. Limit processed foods and sugary drinks.   - I am referring you to vascular surgery for evaluation of lower leg circulation. Diabetes control, smoking cessation can help improving blood supply.   - Follow up in 3 months.

## 2024-04-22 NOTE — Progress Notes (Signed)
 Patient Assistance Program (PAP) Application   Manufacturer: [employer] ([employer])    (Re-enrollment) Expires 06/20/24 Medication(s): Jardiance  25 mg  Patient Portion of Application:  9/23: Filled out by patient and dropped off at clinic Income Documentation: Not provided  Provider Portion of Application:  9/23: Provider portion completed by Pharmacist and placed in PCP inbox for signature. Prescription(s): Included in MAP application.  Forwarded to [facility] [name] Patient Advocate Team for future correspondences/re-enrollment.  Note routed to PCP Clinic Pool to ensure PCP signature is obtained and application is faxed.

## 2024-04-22 NOTE — Assessment & Plan Note (Signed)
 Poor glycemic control with A1c at 8.9%. Peripheral neuropathy and possible vascular insufficiency indicated by cool, bluish toes. Non-compliance with diet and high carbohydrate intake noted. Smoking exacerbates circulatory issues.  Order A1c and relevant labs to assess glycemic control. Refer to vascular surgery for arterial blood supply evaluation. Advise dietary modifications to reduce carbohydrate intake and use sugar substitutes. Encourage smoking cessation. Continue Rybelsus  7 mg daily, did not tolerate 14 mg in the past.  Continue Metformin  1000 mg BID (two 500 mg in AM and PM). Continue Jardiance  25 mg daily.  He is part of patient assistance program for both Jardiance  and Rybelsus , patient brought a prior auth form. This was discussed with pharmacist Manuelita, will work on sending prior auth form.

## 2024-04-22 NOTE — Assessment & Plan Note (Signed)
 Peripheral neuropathy and possible vascular insufficiency indicated by cool, bluish toes, reduced monofilament exam. Risk: uncontrolled DM, smoking, hyperlipidemia.  Vascular surgery referral made today, will benefit from ABI/duplex ultrasound. Risk factor management discussed

## 2024-04-23 ENCOUNTER — Ambulatory Visit: Payer: Self-pay

## 2024-04-23 LAB — COMPREHENSIVE METABOLIC PANEL WITH GFR
ALT: 13 U/L (ref 0–53)
AST: 15 U/L (ref 0–37)
Albumin: 4.6 g/dL (ref 3.5–5.2)
Alkaline Phosphatase: 56 U/L (ref 39–117)
BUN: 21 mg/dL (ref 6–23)
CO2: 26 meq/L (ref 19–32)
Calcium: 9.8 mg/dL (ref 8.4–10.5)
Chloride: 100 meq/L (ref 96–112)
Creatinine, Ser: 1.08 mg/dL (ref 0.40–1.50)
GFR: 67.98 mL/min (ref >60.00)
Glucose, Bld: 202 mg/dL — ABNORMAL HIGH (ref 70–99)
Potassium: 4.1 meq/L (ref 3.5–5.1)
Sodium: 137 meq/L (ref 135–145)
Total Bilirubin: 0.5 mg/dL (ref 0.2–1.2)
Total Protein: 7.2 g/dL (ref 6.0–8.3)

## 2024-04-23 LAB — LIPID PANEL
Cholesterol: 106 mg/dL (ref 0–200)
HDL: 30.1 mg/dL — ABNORMAL LOW (ref >39.00)
NonHDL: 76.24
Total CHOL/HDL Ratio: 4
Triglycerides: 447 mg/dL — ABNORMAL HIGH (ref 0.0–149.0)
VLDL: 89.4 mg/dL — ABNORMAL HIGH (ref 0.0–40.0)

## 2024-04-23 LAB — LDL CHOLESTEROL, DIRECT: Direct LDL: 39 mg/dL

## 2024-04-23 LAB — HEMOGLOBIN A1C: Hgb A1c MFr Bld: 9.1 % — ABNORMAL HIGH (ref 4.6–6.5)

## 2024-05-06 ENCOUNTER — Encounter: Payer: Self-pay | Admitting: Pharmacist

## 2024-05-08 ENCOUNTER — Telehealth: Payer: Self-pay

## 2024-05-08 NOTE — Telephone Encounter (Signed)
 Pt came in and picked up their medications.

## 2024-05-08 NOTE — Telephone Encounter (Signed)
 Pt notified of patient assistance meds ready for pick up  Rybelsus  4 boxes lot: [id number] exp: 07/30/24

## 2024-05-29 ENCOUNTER — Other Ambulatory Visit (INDEPENDENT_AMBULATORY_CARE_PROVIDER_SITE_OTHER): Payer: Self-pay | Admitting: Nurse Practitioner

## 2024-05-29 DIAGNOSIS — I739 Peripheral vascular disease, unspecified: Secondary | ICD-10-CM

## 2024-05-30 ENCOUNTER — Ambulatory Visit (INDEPENDENT_AMBULATORY_CARE_PROVIDER_SITE_OTHER): Payer: Self-pay

## 2024-05-30 ENCOUNTER — Encounter (INDEPENDENT_AMBULATORY_CARE_PROVIDER_SITE_OTHER): Payer: Self-pay | Admitting: Nurse Practitioner

## 2024-05-30 ENCOUNTER — Ambulatory Visit (INDEPENDENT_AMBULATORY_CARE_PROVIDER_SITE_OTHER): Payer: Self-pay | Admitting: Nurse Practitioner

## 2024-05-30 VITALS — BP 125/59 | HR 89 | Resp 18 | Ht 73.0 in | Wt 231.4 lb

## 2024-05-30 DIAGNOSIS — I152 Hypertension secondary to endocrine disorders: Secondary | ICD-10-CM | POA: Diagnosis not present

## 2024-05-30 DIAGNOSIS — E1159 Type 2 diabetes mellitus with other circulatory complications: Secondary | ICD-10-CM | POA: Diagnosis not present

## 2024-05-30 DIAGNOSIS — I739 Peripheral vascular disease, unspecified: Secondary | ICD-10-CM

## 2024-06-02 LAB — VAS US ABI WITH/WO TBI
Left ABI: 0.88
Right ABI: 1.03

## 2024-06-08 ENCOUNTER — Encounter (INDEPENDENT_AMBULATORY_CARE_PROVIDER_SITE_OTHER): Payer: Self-pay | Admitting: Nurse Practitioner

## 2024-06-08 NOTE — Progress Notes (Signed)
 Subjective:    Patient ID: Kevin Ryan, male    DOB: 1951-01-27, 73 y.o.   MRN: [id number] Chief Complaint  Patient presents with   New Patient (Initial Visit)    Ref Bair consult PVD    The patient presents today as a referral from Dr. Hayden in regards to possible peripheral vascular disease.  There was some discoloration concern.  He has a history of diabetes, hypertension and hyperlipidemia.  He is also a current smoker.  He does endorse having some claudication pain but it is not lifestyle limiting or debilitating for him.  He denies rest pain or any active open wounds or ulcerations.  He has an ABI of 1.03 on the right and 0.88 on the left.  Additionally has multiphasic waveforms bilaterally.    Review of Systems  All other systems reviewed and are negative.      Objective:   Physical Exam Vitals reviewed.  HENT:     Head: Normocephalic.  Cardiovascular:     Rate and Rhythm: Normal rate.     Pulses:          Dorsalis pedis pulses are detected w/ Doppler on the right side and detected w/ Doppler on the left side.       Posterior tibial pulses are detected w/ Doppler on the right side and detected w/ Doppler on the left side.  Pulmonary:     Effort: Pulmonary effort is normal.  Skin:    General: Skin is warm and dry.  Neurological:     Mental Status: He is alert and oriented to person, place, and time.  Psychiatric:        Mood and Affect: Mood normal.        Behavior: Behavior normal.        Thought Content: Thought content normal.        Judgment: Judgment normal.     BP (!) 125/59   Pulse 89   Resp 18   Ht 6' 1 (1.854 m)   Wt 231 lb 6.4 oz (105 kg)   BMI 30.53 kg/m   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   CAD (coronary artery disease)    s/p MI   CAD (coronary artery disease)    s/p stent x 1 2004    CAD (coronary artery disease)  03/25/2018   Cancer (HCC)    spindle cell stomach    Cervical spondylosis with myelopathy 11/21/2017   CTS (carpal tunnel syndrome) 03/28/2018   Diabetes mellitus without complication (HCC)    oral meds, Type 2   Elevated TSH 03/28/2018   Erectile dysfunction 04/26/2022   Excessive cerumen in both ear canals 05/16/2018   Gastric mass 04/12/2015   cancer; resected   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 We switched to fenofibrate  in June. He should be taking rosuvastatin  40 mg + ezetimibe  10 mg + fenofibrate  134 mg daily. 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    Myocardial infarction [facility]) 2004   Personal history of tobacco use, presenting hazards to health 07/13/2015   Pneumonia    Shoulder pain, right 01/27/2020   Dr. 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   Thrombocytopenia 09/24/2022   Vertigo    none in approx 1 yr   Vertigo 09/17/2018   Viral upper respiratory tract infection 07/19/2021   Wears dentures    partial upper   Wears hearing aid in both ears     Social History   Socioeconomic History   Marital status: Married    Spouse name: Not on file   Number of children: Not on file   Years of education: Not on file   Highest education level: Not on file  Occupational History   Not on file  Tobacco Use   Smoking status: Some Days    Current packs/day: 0.50    Average packs/day: 0.5 packs/day for 55.9 years (27.9 ttl pk-yrs)    Types: Cigarettes    Start date: 1970   Smokeless tobacco: Former    Quit date: 06/16/1969   Tobacco comments:    1 ppd or a little less (since age 22)  Vaping Use   Vaping status: Former  Substance and Sexual Activity   Alcohol use: No    Comment: may have drink 3-4x/yr   Drug use: No   Sexual activity: Yes    Birth control/protection: None  Other Topics Concern   Not on file  Social History Narrative   Owns guns    Wears seat belt    Safe in relationship    Married 2 sons    -married 44 years as of 09/2019    On disability    Assoc degree and prev supervisor    Retired as of 09/2019 end of month       Social Drivers of Corporate Investment Banker Strain: Medium Risk (07/18/2021)   Overall Financial Resource Strain (CARDIA)    Difficulty of Paying Living Expenses: Somewhat hard  Food Insecurity: No Food Insecurity (08/10/2020)   Hunger Vital Sign    Worried About Running Out of Food in the Last Year: Never true    Ran Out of Food in the Last Year: Never true  Transportation Needs: No Transportation Needs (08/10/2020)   PRAPARE - Administrator, Civil Service  (Medical): No    Lack of Transportation (Non-Medical): No  Physical  Activity: Not on file  Stress: No Stress Concern Present (08/10/2020)   [employer] of Occupational Health - Occupational Stress Questionnaire    Feeling of Stress : Not at all  Social Connections: Unknown (08/10/2020)   Social Connection and Isolation Panel    Frequency of Communication with Friends and Family: Not on file    Frequency of Social Gatherings with Friends and Family: Not on file    Attends Religious Services: Not on file    Active Member of Clubs or Organizations: Not on file    Attends Banker Meetings: Not on file    Marital Status: Married  Intimate Partner Violence: Not At Risk (08/10/2020)   Humiliation, Afraid, Rape, and Kick questionnaire    Fear of Current or Ex-Partner: No    Emotionally Abused: No    Physically Abused: No    Sexually Abused: No    Past Surgical History:  Procedure Laterality Date   BACK SURGERY     01/29/18 [facility] lower  back    CARDIAC CATHETERIZATION  2004   1 stent   CARPAL TUNNEL RELEASE Bilateral    CATARACT EXTRACTION W/PHACO Left 06/04/2019   Procedure: CATARACT EXTRACTION PHACO AND INTRAOCULAR LENS PLACEMENT (IOC) LEFT DIABETIC 01:08.3  18.6%  12.76;  Surgeon: Mittie Gaskin, MD;  Location: [facility];  Service: Ophthalmology;  Laterality: Left;  diabetic - oral meds sleep apnea   CATARACT EXTRACTION W/PHACO Right 07/02/2019   Procedure: CATARACT EXTRACTION PHACO AND INTRAOCULAR LENS PLACEMENT (IOC) Right DIABETIC;  Surgeon: Mittie Gaskin, MD;  Location: [facility];  Service: Ophthalmology;  Laterality: Right;  diabetic-oral med sleep apnea (CPAP)   COLONOSCOPY WITH PROPOFOL  N/A 03/26/2015   Procedure: COLONOSCOPY WITH PROPOFOL ;  Surgeon: Donnice Vaughn Manes, MD;  Location: [facility];  Service: Endoscopy;  Laterality: N/A;   COLONOSCOPY WITH PROPOFOL  N/A 09/07/2021   Procedure: COLONOSCOPY WITH PROPOFOL ;  Surgeon:  Toledo, Ladell POUR, MD;  Location: [facility];  Service: Gastroenterology;  Laterality: N/A;  DM   KNEE SURGERY     TKR   KNEE SURGERY     b/l partial knee Dr. Roy Hails    PARTIAL GASTRECTOMY     2016 [facility]    Family History  Problem Relation Age of Onset   Diabetes Mellitus II Maternal Grandfather    Heart disease Maternal Grandfather    CAD Paternal Grandfather    Cancer Paternal Grandfather        ? type   Arthritis Mother    Depression Mother    Hearing loss Mother    Diabetes Father    Cancer Brother        throat smoker    Hearing loss Maternal Grandmother    Arthritis Maternal Grandmother     Allergies  Allergen Reactions   Ibuprofen Itching and Rash       Latest Ref Rng & Units 10/11/2023    1:50 PM 08/03/2023    2:25 PM 07/13/2023   11:55 AM  CBC  WBC 4.0 - 10.5 K/uL 10.4  7.8  10.6   Hemoglobin 13.0 - 17.0 g/dL 84.5  84.6  84.8   Hematocrit 39.0 - 52.0 % 45.9  46.7  45.5   Platelets 150.0 - 400.0 K/uL 162.0  132  161.0       CMP     Component Value Date/Time   NA 137 04/22/2024 1438   K 4.1 04/22/2024 1438   CL 100 04/22/2024 1438   CO2 26 04/22/2024 1438  GLUCOSE 202 (H) 04/22/2024 1438   BUN 21 04/22/2024 1438   CREATININE 1.08 04/22/2024 1438   CREATININE 1.01 05/16/2018 0800   CALCIUM  9.8 04/22/2024 1438   PROT 7.2 04/22/2024 1438   ALBUMIN 4.6 04/22/2024 1438   AST 15 04/22/2024 1438   ALT 13 04/22/2024 1438   ALKPHOS 56 04/22/2024 1438   BILITOT 0.5 04/22/2024 1438   GFR 67.98 04/22/2024 1438   GFRNONAA >60 07/08/2023 1453     VAS US  ABI WITH/WO TBI Result Date: 06/02/2024  LOWER EXTREMITY DOPPLER STUDY Patient Name:  LINSEY HIROTA  Date of Exam:   05/30/2024 Medical Rec #: [id number]       Accession #:    [id number] Date of Birth: 1950/09/02        Patient Gender: M Patient Age:   31 years Exam Location:  [facility] Procedure:      VAS US  ABI WITH/WO TBI Referring Phys: ORVIN DARING  --------------------------------------------------------------------------------  Indications: Claudication, and peripheral artery disease. High Risk         Hypertension, Diabetes, current smoker, coronary artery Factors:          disease. Other Factors: Drop foot bilaterally.  Performing Technologist: Donnice Charnley RVT  Examination Guidelines: A complete evaluation includes at minimum, Doppler waveform signals and systolic blood pressure reading at the level of bilateral brachial, anterior tibial, and posterior tibial arteries, when vessel segments are accessible. Bilateral testing is considered an integral part of a complete examination. Photoelectric Plethysmograph (PPG) waveforms and toe systolic pressure readings are included as required and additional duplex testing as needed. Limited examinations for reoccurring indications may be performed as noted.  ABI Findings: +---------+------------------+-----+---------+--------+ Right    Rt Pressure (mmHg)IndexWaveform Comment  +---------+------------------+-----+---------+--------+ Brachial 145                                      +---------+------------------+-----+---------+--------+ PTA      114               0.79 biphasic          +---------+------------------+-----+---------+--------+ DP       149               1.03 triphasic         +---------+------------------+-----+---------+--------+ Great Toe75                0.52                   +---------+------------------+-----+---------+--------+ +---------+------------------+-----+---------+-------+ Left     Lt Pressure (mmHg)IndexWaveform Comment +---------+------------------+-----+---------+-------+ Brachial 144                                     +---------+------------------+-----+---------+-------+ PTA      116               0.80 triphasic        +---------+------------------+-----+---------+-------+ DP       127               0.88 triphasic         +---------+------------------+-----+---------+-------+ Great Toe92                0.63                  +---------+------------------+-----+---------+-------+ +-------+-----------+-----------+------------+------------+ ABI/TBIToday's ABIToday's TBIPrevious ABIPrevious TBI +-------+-----------+-----------+------------+------------+  Right  1.03       0.52                                +-------+-----------+-----------+------------+------------+ Left   0.88       0.63                                +-------+-----------+-----------+------------+------------+   Summary: Right: Resting right ankle-brachial index is within normal range. The right toe-brachial index is abnormal.  Left: Resting left ankle-brachial index indicates mild left lower extremity arterial disease. The left toe-brachial index is abnormal.  *See table(s) above for measurements and observations.  Electronically signed by Selinda Gu MD on 06/02/2024 at 8:06:55 AM.    Final        Assessment & Plan:   1. Peripheral vascular disease (Primary) The patient does have some evidence of peripheral vascular disease on his studies today however with further discussion he is not suffering with lifestyle limiting claudication.  He also does not have any limb threatening symptoms at this time.  Based on that I believe conservative management is certainly reasonable.  He is currently on statin, Plavix  and an aspirin  he should remain on these currently.  Will plan on having repeat studies in 6 months or sooner if his symptoms worsen.  2. Hypertension associated with diabetes (HCC) Continue antihypertensive medications as already ordered, these medications have been reviewed and there are no changes at this time.   Current Outpatient Medications on File Prior to Visit  Medication Sig Dispense Refill   acetaminophen  (TYLENOL ) 500 MG tablet Take 1,000 mg by mouth in the morning and at bedtime. (Patient taking differently: Take 1,000  mg by mouth daily.)     aspirin  81 MG tablet Take 81 mg by mouth daily.     Cholecalciferol (D3 2000 PO) Take 1 tablet by mouth daily.     clopidogrel  (PLAVIX ) 75 MG tablet Take 1 tablet (75 mg total) by mouth daily. 90 tablet 3   empagliflozin  (JARDIANCE ) 25 MG TABS tablet Take 1 tablet (25 mg total) by mouth daily before breakfast. 90 tablet 3   ezetimibe  (ZETIA ) 10 MG tablet Take 1 tablet (10 mg total) by mouth daily. 90 tablet 3   fenofibrate  micronized (LOFIBRA) 134 MG capsule Take 1 capsule (134 mg total) by mouth daily before breakfast. 90 capsule 3   Folic Acid (FOLATE PO) Take 1 tablet by mouth daily in the afternoon.     lisinopril  (ZESTRIL ) 2.5 MG tablet Take 1 tablet (2.5 mg total) by mouth daily. 90 tablet 3   metFORMIN  (GLUCOPHAGE -XR) 500 MG 24 hr tablet TAKE TWO TABLETS BY MOUTH IN THE MORNINGAND AT BEDTIME 360 tablet 3   metoprolol  tartrate (LOPRESSOR ) 50 MG tablet Take 1 tablet (50 mg total) by mouth 2 (two) times daily. 180 tablet 3   mupirocin  ointment (BACTROBAN ) 2 % Apply 1 Application topically 2 (two) times daily. Prn wounds (Patient taking differently: Apply 1 Application topically as needed. Prn wounds) 30 g 0   Omega-3 Fatty Acids (FISH OIL PO) Take 1 tablet by mouth 2 (two) times daily.     Probiotic Product (PROBIOTIC DAILY PO) Take by mouth.     rosuvastatin  (CRESTOR ) 40 MG tablet Take 1 tablet (40 mg total) by mouth at bedtime. 90 tablet 3   Semaglutide  (RYBELSUS ) 7 MG TABS Take 1 tablet (7  mg total) by mouth daily. Pt asst program 30 tablet 0   vitamin B-12 (CYANOCOBALAMIN ) 500 MCG tablet Take 5,000 mcg by mouth daily.      MAGNESIUM PO Take by mouth daily. (Patient not taking: Reported on 05/30/2024)     No current facility-administered medications on file prior to visit.    There are no Patient Instructions on file for this visit. No follow-ups on file.   Siria Calandro E Ramya Vanbergen, NP

## 2024-06-09 ENCOUNTER — Telehealth: Payer: Self-pay

## 2024-06-09 NOTE — Telephone Encounter (Signed)
 PAP: Patient assistance application for Jardiance through [employer]) has been mailed to pt's home address on file. Provider portion of application will be faxed to provider's office.

## 2024-07-02 ENCOUNTER — Telehealth: Payer: Self-pay

## 2024-07-02 NOTE — Telephone Encounter (Signed)
 Text message sent, lm and sent MyChart message: To call and reschedule 07/29/2024 appointment/provider off.  [id number] please reschedule appt.

## 2024-07-09 NOTE — Telephone Encounter (Signed)
 Reached out to patient regarding PAP application (BI) Jardiance .  left HIPAA compliant v/m for return call.

## 2024-07-10 ENCOUNTER — Ambulatory Visit: Payer: PRIVATE HEALTH INSURANCE

## 2024-07-10 VITALS — BP 130/60 | HR 84 | Temp 98.5°F | Ht 73.0 in | Wt 229.0 lb

## 2024-07-10 DIAGNOSIS — E118 Type 2 diabetes mellitus with unspecified complications: Secondary | ICD-10-CM | POA: Insufficient documentation

## 2024-07-10 DIAGNOSIS — I152 Hypertension secondary to endocrine disorders: Secondary | ICD-10-CM

## 2024-07-10 DIAGNOSIS — E1159 Type 2 diabetes mellitus with other circulatory complications: Secondary | ICD-10-CM

## 2024-07-10 DIAGNOSIS — E785 Hyperlipidemia, unspecified: Secondary | ICD-10-CM

## 2024-07-10 DIAGNOSIS — I2510 Atherosclerotic heart disease of native coronary artery without angina pectoris: Secondary | ICD-10-CM

## 2024-07-10 DIAGNOSIS — F17200 Nicotine dependence, unspecified, uncomplicated: Secondary | ICD-10-CM | POA: Insufficient documentation

## 2024-07-10 DIAGNOSIS — Z7984 Long term (current) use of oral hypoglycemic drugs: Secondary | ICD-10-CM

## 2024-07-10 DIAGNOSIS — E1169 Type 2 diabetes mellitus with other specified complication: Secondary | ICD-10-CM

## 2024-07-10 MED ORDER — LISINOPRIL 2.5 MG PO TABS: 2.5000 mg | ORAL_TABLET | Freq: Every day | ORAL | 3 refills | Status: AC

## 2024-07-10 MED ORDER — RYBELSUS 7 MG PO TABS: 7.0000 mg | ORAL_TABLET | Freq: Every day | ORAL | 0 refills | Status: AC

## 2024-07-10 MED ORDER — METOPROLOL TARTRATE 50 MG PO TABS: 50.0000 mg | ORAL_TABLET | Freq: Two times a day (BID) | ORAL | 3 refills | Status: AC

## 2024-07-10 MED ORDER — CLOPIDOGREL BISULFATE 75 MG PO TABS: 75.0000 mg | ORAL_TABLET | Freq: Every day | ORAL | 3 refills | Status: AC

## 2024-07-10 MED ORDER — ROSUVASTATIN CALCIUM 40 MG PO TABS: 40.0000 mg | ORAL_TABLET | Freq: Every day | ORAL | 3 refills | Status: AC

## 2024-07-10 MED ORDER — EZETIMIBE 10 MG PO TABS: 10.0000 mg | ORAL_TABLET | Freq: Every day | ORAL | 3 refills | Status: AC

## 2024-07-10 MED ORDER — FENOFIBRATE MICRONIZED 134 MG PO CAPS: 134.0000 mg | ORAL_CAPSULE | Freq: Every day | ORAL | 3 refills | Status: AC

## 2024-07-10 MED ORDER — EMPAGLIFLOZIN 25 MG PO TABS: 25.0000 mg | ORAL_TABLET | Freq: Every day | ORAL | 3 refills | Status: AC

## 2024-07-10 MED ORDER — METFORMIN HCL ER 500 MG PO TB24: ORAL_TABLET | ORAL | 3 refills | Status: AC

## 2024-07-10 NOTE — Assessment & Plan Note (Addendum)
 A1c from 04/21/24 indicating poor glycemic control at 9.1%  Non-compliance with dietary recommendations noted. Added diabetic eating plan and provided dietary resources. Scheduled fasting lab appointment post-December 23rd. Encouraged use of artificial sweeteners.   Continue current medications as outlined.   We will fax the patient assistance form patient brought with him to our prior authorization team.   I also discussed with patient that if his A1c is above 10% I recommend endocrinology referral.   Strongly encouraged annual diabetic eye exam.   Orders:   HgB A1c; Future   empagliflozin  (JARDIANCE ) 25 MG TABS tablet; Take 1 tablet (25 mg total) by mouth daily before breakfast.   metFORMIN  (GLUCOPHAGE -XR) 500 MG 24 hr tablet; TAKE TWO TABLETS BY MOUTH IN THE MORNINGAND AT BEDTIME   Semaglutide  (RYBELSUS ) 7 MG TABS; Take 1 tablet (7 mg total) by mouth daily. Pt asst program

## 2024-07-10 NOTE — Assessment & Plan Note (Signed)
 BP goal <130/80 mmHg. Continue Lisinopril  2.5 mg and Metoprolol  tartrate 50 mg BID.  Orders:   lisinopril  (ZESTRIL ) 2.5 MG tablet; Take 1 tablet (2.5 mg total) by mouth daily.   metoprolol  tartrate (LOPRESSOR ) 50 MG tablet; Take 1 tablet (50 mg total) by mouth 2 (two) times daily.

## 2024-07-10 NOTE — Assessment & Plan Note (Signed)
 No chest pain. Continue Aspirin  81 mg and Plavix  75 mg daily. Continue follow up with cardiologist at [facility].  Orders:   clopidogrel  (PLAVIX ) 75 MG tablet; Take 1 tablet (75 mg total) by mouth daily.

## 2024-07-10 NOTE — Assessment & Plan Note (Signed)
 Plan per type II DM with hyperglycemia.  Orders:   empagliflozin  (JARDIANCE ) 25 MG TABS tablet; Take 1 tablet (25 mg total) by mouth daily before breakfast.   metFORMIN  (GLUCOPHAGE -XR) 500 MG 24 hr tablet; TAKE TWO TABLETS BY MOUTH IN THE MORNINGAND AT BEDTIME   Semaglutide  (RYBELSUS ) 7 MG TABS; Take 1 tablet (7 mg total) by mouth daily. Pt asst program

## 2024-07-10 NOTE — Patient Instructions (Addendum)
 Diet: Emphasize whole grains, lean proteins, fruits, and vegetables. Limit processed foods and sugary drinks. Exercise: Aim for 150 minutes of moderate aerobic activity weekly plus strength training twice a week.  Please schedule fasting lab anytime after 07/22/24. Follow up with Dr. Abbey in 3 months

## 2024-07-10 NOTE — Assessment & Plan Note (Addendum)
 LDL within goal with elevated TG. Again encouraged patient on lifestyle modifications including low carbohydrate diet, regular exercise. Continue Crestor  40 mg, zetia  10 mg and fibrate 134 mg daily.  Check fasting lipid panel after 07/22/24.  Orders:   Lipid panel; Future   ezetimibe  (ZETIA ) 10 MG tablet; Take 1 tablet (10 mg total) by mouth daily.   fenofibrate  micronized (LOFIBRA) 134 MG capsule; Take 1 capsule (134 mg total) by mouth daily before breakfast.   rosuvastatin  (CRESTOR ) 40 MG tablet; Take 1 tablet (40 mg total) by mouth at bedtime.

## 2024-07-10 NOTE — Assessment & Plan Note (Signed)
 Continues to smoke, reduced from previous levels. Smoking increases risk of heart attack and uncontrolled blood pressure. Resigned about cessation due to age and family history. Encouraged smoking cessation and discussed risks with him.

## 2024-07-10 NOTE — Progress Notes (Signed)
 Established Patient Office Visit   Subjective  Patient ID: Kevin Ryan, male    DOB: 1950-10-01  Age: 73 y.o. MRN: [id number]  Chief Complaint  Patient presents with   Hypertension   Hyperlipidemia   Diabetes    Patient most recent Diabetic Eye Exam was requested at today's visit.     Discussed the use of AI scribe software for clinical note transcription with the patient, who gave verbal consent to proceed.  History of Present Illness Kevin Ryan is a 73 year old male who presents for medication management and dietary counseling.  - He has uncontrolled type II DM and is not compliant with low carbohydrate diet. He is currently on Rybelsus  7 mg (not able to tolerate 14 mg), Jardiance  25 mg and Metformin .  He comes to our clinic to pick up Jardiance  and Rybelsus  and has medical assistance through [employer]. A1c from 9/23 was 9.1%.   - Hyperlipidemia, mixed:  LDL within goal from 04/22/24 but Triglycerides elevated at 447. He continues to consume high-carbohydrate foods like biscuits and sweet tea. He exercises regularly. He takes Crestor  40 mg, Zetia  10 mg and fenofibrate  134 mg daily.   - Current everyday smoking: He smokes variably, sometimes up to ten cigarettes a day, but not a full pack as he used to. He has a history of CAD and acknowledges the risk factors associated with smoking, especially given his history of heart attack and cancer. However, he is not ready to quit yet.    - He has not had an eye exam in over a year. He believes his life expectancy is limited based on family history, with relatives passing in their early 23. No chest pain, swelling in legs, or abdominal pain.    ROS As per HPI    Objective:     BP 130/60 (BP Location: Left Arm, Patient Position: Sitting, Cuff Size: Normal)   Pulse 84   Temp 98.5 F (36.9 C) (Oral)   Ht 6' 1 (1.854 m)   Wt 229 lb (103.9 kg)   SpO2 97%   BMI 30.21 kg/m      07/10/2024   11:37 AM 04/22/2024    2:07  PM 10/11/2023    1:07 PM  Depression screen PHQ 2/9  Decreased Interest 0 0 0  Down, Depressed, Hopeless 0 0 0  PHQ - 2 Score 0 0 0  Altered sleeping 0 1 0  Tired, decreased energy 0 0 0  Change in appetite 0 0 0  Feeling bad or failure about yourself  0 0 0  Trouble concentrating 0 0 0  Moving slowly or fidgety/restless 0 0 0  Suicidal thoughts 0 0 0  PHQ-9 Score 0 1  0   Difficult doing work/chores Not difficult at all Not difficult at all Not difficult at all     Data saved with a previous flowsheet row definition      07/10/2024   11:37 AM 04/22/2024    2:08 PM 10/11/2023    1:07 PM 07/13/2023   11:05 AM  GAD 7 : Generalized Anxiety Score  Nervous, Anxious, on Edge 0 0 0 0  Control/stop worrying 0 0 0 0  Worry too much - different things 0 0 0 0  Trouble relaxing 0 0 0 0  Restless 0 0 0 0  Easily annoyed or irritable 0 0 0 0  Afraid - awful might happen 0 0 0 0  Total GAD 7 Score 0 0 0 0  Anxiety Difficulty Not difficult at all Not difficult at all Not difficult at all Not difficult at all      07/10/2024   11:37 AM 04/22/2024    2:07 PM 10/11/2023    1:07 PM  Depression screen PHQ 2/9  Decreased Interest 0 0 0  Down, Depressed, Hopeless 0 0 0  PHQ - 2 Score 0 0 0  Altered sleeping 0 1 0  Tired, decreased energy 0 0 0  Change in appetite 0 0 0  Feeling bad or failure about yourself  0 0 0  Trouble concentrating 0 0 0  Moving slowly or fidgety/restless 0 0 0  Suicidal thoughts 0 0 0  PHQ-9 Score 0 1  0   Difficult doing work/chores Not difficult at all Not difficult at all Not difficult at all     Data saved with a previous flowsheet row definition      07/10/2024   11:37 AM 04/22/2024    2:08 PM 10/11/2023    1:07 PM 07/13/2023   11:05 AM  GAD 7 : Generalized Anxiety Score  Nervous, Anxious, on Edge 0 0 0 0  Control/stop worrying 0 0 0 0  Worry too much - different things 0 0 0 0  Trouble relaxing 0 0 0 0  Restless 0 0 0 0  Easily annoyed or irritable  0 0 0 0  Afraid - awful might happen 0 0 0 0  Total GAD 7 Score 0 0 0 0  Anxiety Difficulty Not difficult at all Not difficult at all Not difficult at all Not difficult at all   SDOH Screenings   Housing: Unknown (07/09/2024)   Received from [facility]  Depression [phone]): Low Risk (07/10/2024)  Financial Resource Strain: Medium Risk (07/18/2021)  Tobacco Use: High Risk (07/10/2024)     Physical Exam Constitutional:      General: He is not in acute distress. HENT:     Head: Normocephalic and atraumatic.     Mouth/Throat:     Mouth: Mucous membranes are moist.  Cardiovascular:     Rate and Rhythm: Normal rate.  Pulmonary:     Effort: Pulmonary effort is normal.     Breath sounds: Normal breath sounds.  Abdominal:     General: Bowel sounds are normal.     Palpations: Abdomen is soft.  Musculoskeletal:     Cervical back: Neck supple.     Right lower leg: No edema.     Left lower leg: No edema.  Neurological:     Mental Status: He is alert and oriented to person, place, and time.     Motor: No weakness.  Psychiatric:        Mood and Affect: Mood normal.        No results found for any visits on 07/10/24.  The ASCVD Risk score (Arnett DK, et al., 2019) failed to calculate for the following reasons:   Risk score cannot be calculated because patient has a medical history suggesting prior/existing ASCVD   * - Cholesterol units were assumed     Assessment & Plan:   Assessment & Plan Type 2 diabetes mellitus with other specified complication, without long-term current use of insulin  (HCC) A1c from 04/21/24 indicating poor glycemic control at 9.1%  Non-compliance with dietary recommendations noted. Added diabetic eating plan and provided dietary resources. Scheduled fasting lab appointment post-December 23rd. Encouraged use of artificial sweeteners.   Continue current medications as outlined.   We will fax the  patient assistance form patient  brought with him to our prior authorization team.   I also discussed with patient that if his A1c is above 10% I recommend endocrinology referral.   Strongly encouraged annual diabetic eye exam.   Orders:   HgB A1c; Future   empagliflozin  (JARDIANCE ) 25 MG TABS tablet; Take 1 tablet (25 mg total) by mouth daily before breakfast.   metFORMIN  (GLUCOPHAGE -XR) 500 MG 24 hr tablet; TAKE TWO TABLETS BY MOUTH IN THE MORNINGAND AT BEDTIME   Semaglutide  (RYBELSUS ) 7 MG TABS; Take 1 tablet (7 mg total) by mouth daily. Pt asst program  Hyperlipidemia associated with type 2 diabetes mellitus (HCC) LDL within goal with elevated TG. Again encouraged patient on lifestyle modifications including low carbohydrate diet, regular exercise. Continue Crestor  40 mg, zetia  10 mg and fibrate 134 mg daily.  Check fasting lipid panel after 07/22/24.  Orders:   Lipid panel; Future   ezetimibe  (ZETIA ) 10 MG tablet; Take 1 tablet (10 mg total) by mouth daily.   fenofibrate  micronized (LOFIBRA) 134 MG capsule; Take 1 capsule (134 mg total) by mouth daily before breakfast.   rosuvastatin  (CRESTOR ) 40 MG tablet; Take 1 tablet (40 mg total) by mouth at bedtime.  Type 2 diabetes mellitus with complications (HCC) Plan per type II DM with hyperglycemia.  Orders:   empagliflozin  (JARDIANCE ) 25 MG TABS tablet; Take 1 tablet (25 mg total) by mouth daily before breakfast.   metFORMIN  (GLUCOPHAGE -XR) 500 MG 24 hr tablet; TAKE TWO TABLETS BY MOUTH IN THE MORNINGAND AT BEDTIME   Semaglutide  (RYBELSUS ) 7 MG TABS; Take 1 tablet (7 mg total) by mouth daily. Pt asst program  Hypertension associated with diabetes (HCC) BP goal <130/80 mmHg. Continue Lisinopril  2.5 mg and Metoprolol  tartrate 50 mg BID.  Orders:   lisinopril  (ZESTRIL ) 2.5 MG tablet; Take 1 tablet (2.5 mg total) by mouth daily.   metoprolol  tartrate (LOPRESSOR ) 50 MG tablet; Take 1 tablet (50 mg total) by mouth 2 (two) times daily.  Coronary artery disease involving  native coronary artery of native heart without angina pectoris No chest pain. Continue Aspirin  81 mg and Plavix  75 mg daily. Continue follow up with cardiologist at [facility].  Orders:   clopidogrel  (PLAVIX ) 75 MG tablet; Take 1 tablet (75 mg total) by mouth daily.  Current every day smoker Continues to smoke, reduced from previous levels. Smoking increases risk of heart attack and uncontrolled blood pressure. Resigned about cessation due to age and family history. Encouraged smoking cessation and discussed risks with him.      Return in about 3 months (around 10/08/2024) for Fasting labs anytime after 07/22/24 and f/u with Dr. Abbey in 3 months .   Luke Abbey, MD

## 2024-07-14 NOTE — Telephone Encounter (Signed)
 Received Patient portion PAP application for Jardiance  ([place]).

## 2024-07-16 NOTE — Telephone Encounter (Signed)
 PAP: Application for Bernadine has been submitted to [employer]), via fax

## 2024-07-29 ENCOUNTER — Ambulatory Visit: Payer: PRIVATE HEALTH INSURANCE

## 2024-07-29 ENCOUNTER — Other Ambulatory Visit (INDEPENDENT_AMBULATORY_CARE_PROVIDER_SITE_OTHER): Payer: PRIVATE HEALTH INSURANCE

## 2024-07-29 DIAGNOSIS — E785 Hyperlipidemia, unspecified: Secondary | ICD-10-CM | POA: Diagnosis not present

## 2024-07-29 DIAGNOSIS — E1169 Type 2 diabetes mellitus with other specified complication: Secondary | ICD-10-CM

## 2024-07-29 LAB — HEMOGLOBIN A1C: Hgb A1c MFr Bld: 9.4 % — ABNORMAL HIGH (ref 4.6–6.5)

## 2024-07-29 LAB — LIPID PANEL
Cholesterol: 99 mg/dL (ref 28–200)
HDL: 27.7 mg/dL — ABNORMAL LOW
LDL Cholesterol: -5 mg/dL — ABNORMAL LOW (ref 10–99)
NonHDL: 71.36
Total CHOL/HDL Ratio: 4
Triglycerides: 384 mg/dL — ABNORMAL HIGH (ref 10.0–149.0)
VLDL: 76.8 mg/dL — ABNORMAL HIGH (ref 0.0–40.0)

## 2024-08-04 ENCOUNTER — Ambulatory Visit: Payer: Self-pay

## 2024-08-04 DIAGNOSIS — E781 Pure hyperglyceridemia: Secondary | ICD-10-CM

## 2024-08-04 MED ORDER — ICOSAPENT ETHYL 1 G PO CAPS: 2.0000 g | ORAL_CAPSULE | Freq: Two times a day (BID) | ORAL | 3 refills | Status: AC

## 2024-08-04 NOTE — Progress Notes (Signed)
 Discussed lab results with the patient on the phone. Recommend starting basal insulin  Glargine 20 U daily, endocrine referral and continuing current treatment with Jardiance  25 mg, Metformin  1000 mg BID and Rybelsus  7 mg daily. Patient reports he was not compliant with diet during the holidays and does not want to start insulin  or see endocrinologist. Discussed risks and benefits of insulin . He prefers extensive liefstyle modifications first and repeat A1c and office visit in 3 months. If A1c is above 8.5% recommend initiating insulin  and endo referral which patient agrees on. Also updated him on lipid panel. Stop over the ocunter omega 3 fatty acid, start Vascepa  2 gm BID with food with caution (GI upset), reach out if s/e develops.  Luke Shade, MD

## 2024-08-07 NOTE — Telephone Encounter (Signed)
 PAP: Patient assistance application for Jardiance  has been approved by PAP Companies: [employer] from 07/31/2024 to 07/30/2025. Medication should be delivered to PAP Delivery: Home. For further shipping updates, please contact [employer] ([employer] at [phone]. Patient ID is: not provided

## 2024-10-16 ENCOUNTER — Ambulatory Visit: Payer: PRIVATE HEALTH INSURANCE

## 2024-11-27 ENCOUNTER — Encounter (INDEPENDENT_AMBULATORY_CARE_PROVIDER_SITE_OTHER)

## 2024-11-27 ENCOUNTER — Ambulatory Visit (INDEPENDENT_AMBULATORY_CARE_PROVIDER_SITE_OTHER): Admitting: Nurse Practitioner
